# Patient Record
Sex: Male | Born: 1945 | Race: White | Hispanic: No | Marital: Single | Smoking: Current some day smoker
Health system: Southern US, Community
[De-identification: ages and names within clinical notes are randomized; demographics above are authoritative.]

## PROBLEM LIST (undated history)

## (undated) DIAGNOSIS — I4510 Unspecified right bundle-branch block: Secondary | ICD-10-CM

## (undated) DIAGNOSIS — Z87442 Personal history of urinary calculi: Secondary | ICD-10-CM

## (undated) DIAGNOSIS — R7303 Prediabetes: Secondary | ICD-10-CM

## (undated) DIAGNOSIS — R350 Frequency of micturition: Secondary | ICD-10-CM

## (undated) DIAGNOSIS — E1165 Type 2 diabetes mellitus with hyperglycemia: Secondary | ICD-10-CM

## (undated) DIAGNOSIS — N138 Other obstructive and reflux uropathy: Secondary | ICD-10-CM

## (undated) DIAGNOSIS — E559 Vitamin D deficiency, unspecified: Secondary | ICD-10-CM

## (undated) DIAGNOSIS — R3915 Urgency of urination: Secondary | ICD-10-CM

## (undated) DIAGNOSIS — K219 Gastro-esophageal reflux disease without esophagitis: Secondary | ICD-10-CM

## (undated) DIAGNOSIS — R12 Heartburn: Secondary | ICD-10-CM

## (undated) DIAGNOSIS — E785 Hyperlipidemia, unspecified: Secondary | ICD-10-CM

## (undated) DIAGNOSIS — N401 Enlarged prostate with lower urinary tract symptoms: Secondary | ICD-10-CM

## (undated) DIAGNOSIS — G4733 Obstructive sleep apnea (adult) (pediatric): Secondary | ICD-10-CM

## (undated) DIAGNOSIS — R1310 Dysphagia, unspecified: Secondary | ICD-10-CM

## (undated) DIAGNOSIS — M1990 Unspecified osteoarthritis, unspecified site: Secondary | ICD-10-CM

## (undated) DIAGNOSIS — N529 Male erectile dysfunction, unspecified: Secondary | ICD-10-CM

## (undated) DIAGNOSIS — I2510 Atherosclerotic heart disease of native coronary artery without angina pectoris: Secondary | ICD-10-CM

## (undated) DIAGNOSIS — M5135 Other intervertebral disc degeneration, thoracolumbar region: Secondary | ICD-10-CM

## (undated) DIAGNOSIS — Z8719 Personal history of other diseases of the digestive system: Secondary | ICD-10-CM

## (undated) DIAGNOSIS — I10 Essential (primary) hypertension: Secondary | ICD-10-CM

## (undated) DIAGNOSIS — K449 Diaphragmatic hernia without obstruction or gangrene: Secondary | ICD-10-CM

## (undated) DIAGNOSIS — I77810 Thoracic aortic ectasia: Secondary | ICD-10-CM

## (undated) DIAGNOSIS — R011 Cardiac murmur, unspecified: Secondary | ICD-10-CM

## (undated) DIAGNOSIS — E669 Obesity, unspecified: Secondary | ICD-10-CM

## (undated) DIAGNOSIS — Z8711 Personal history of peptic ulcer disease: Secondary | ICD-10-CM

## (undated) DIAGNOSIS — K760 Fatty (change of) liver, not elsewhere classified: Secondary | ICD-10-CM

## (undated) DIAGNOSIS — D126 Benign neoplasm of colon, unspecified: Secondary | ICD-10-CM

## (undated) DIAGNOSIS — E291 Testicular hypofunction: Secondary | ICD-10-CM

## (undated) DIAGNOSIS — E349 Endocrine disorder, unspecified: Secondary | ICD-10-CM

## (undated) DIAGNOSIS — M720 Palmar fascial fibromatosis [Dupuytren]: Secondary | ICD-10-CM

## (undated) HISTORY — DX: Benign prostatic hyperplasia with lower urinary tract symptoms: N40.1

## (undated) HISTORY — DX: Male erectile dysfunction, unspecified: N52.9

## (undated) HISTORY — DX: Personal history of peptic ulcer disease: Z87.11

## (undated) HISTORY — PX: CARDIAC CATHETERIZATION: SHX172

## (undated) HISTORY — PX: UPPER GASTROINTESTINAL ENDOSCOPY: SHX188

## (undated) HISTORY — DX: Obesity, unspecified: E66.9

## (undated) HISTORY — DX: Dysphagia, unspecified: R13.10

## (undated) HISTORY — DX: Personal history of other diseases of the digestive system: Z87.19

## (undated) HISTORY — PX: COLONOSCOPY: SHX174

## (undated) HISTORY — PX: OTHER SURGICAL HISTORY: SHX169

## (undated) HISTORY — PX: ACHILLES TENDON REPAIR: SUR1153

## (undated) HISTORY — DX: Vitamin D deficiency, unspecified: E55.9

## (undated) HISTORY — DX: Testicular hypofunction: E29.1

## (undated) HISTORY — DX: Urgency of urination: R39.15

## (undated) HISTORY — PX: APPENDECTOMY: SHX54

## (undated) HISTORY — DX: Heartburn: R12

## (undated) HISTORY — DX: Frequency of micturition: R35.0

## (undated) HISTORY — DX: Personal history of urinary calculi: Z87.442

## (undated) HISTORY — DX: Benign prostatic hyperplasia with lower urinary tract symptoms: N13.8

## (undated) HISTORY — DX: Benign neoplasm of colon, unspecified: D12.6

## (undated) HISTORY — DX: Hyperlipidemia, unspecified: E78.5

## (undated) HISTORY — DX: Atherosclerotic heart disease of native coronary artery without angina pectoris: I25.10

---

## 2005-05-14 ENCOUNTER — Emergency Department: Payer: Self-pay | Admitting: Emergency Medicine

## 2007-06-02 ENCOUNTER — Ambulatory Visit: Payer: Self-pay | Admitting: Internal Medicine

## 2009-11-17 ENCOUNTER — Ambulatory Visit: Payer: Self-pay | Admitting: Unknown Physician Specialty

## 2011-11-14 ENCOUNTER — Ambulatory Visit: Payer: Self-pay

## 2011-11-14 LAB — COMPREHENSIVE METABOLIC PANEL
Anion Gap: 8 (ref 7–16)
BUN: 18 mg/dL (ref 7–18)
Bilirubin,Total: 0.5 mg/dL (ref 0.2–1.0)
Chloride: 109 mmol/L — ABNORMAL HIGH (ref 98–107)
Co2: 25 mmol/L (ref 21–32)
EGFR (African American): >60
Osmolality: 284 (ref 275–301)
Potassium: 4.1 mmol/L (ref 3.5–5.1)
SGOT(AST): 31 U/L (ref 15–37)
Sodium: 142 mmol/L (ref 136–145)
Total Protein: 7.3 g/dL (ref 6.4–8.2)

## 2011-11-14 LAB — CBC WITH DIFFERENTIAL/PLATELET
Basophil #: 0.1 10*3/uL (ref 0.0–0.1)
Basophil %: 0.9 %
Eosinophil %: 2.1 %
Lymphocyte #: 1.6 10*3/uL (ref 1.0–3.6)
MCH: 29.9 pg (ref 26.0–34.0)
Monocyte %: 6.7 %
Neutrophil #: 4.2 10*3/uL (ref 1.4–6.5)
Neutrophil %: 64.8 %
Platelet: 210 10*3/uL (ref 150–440)
RBC: 4.91 10*6/uL (ref 4.40–5.90)
RDW: 13.3 % (ref 11.5–14.5)
WBC: 6.4 10*3/uL (ref 3.8–10.6)

## 2013-01-28 ENCOUNTER — Ambulatory Visit: Payer: Self-pay | Admitting: Unknown Physician Specialty

## 2013-07-12 DIAGNOSIS — N400 Enlarged prostate without lower urinary tract symptoms: Secondary | ICD-10-CM | POA: Insufficient documentation

## 2013-07-12 DIAGNOSIS — E349 Endocrine disorder, unspecified: Secondary | ICD-10-CM | POA: Insufficient documentation

## 2013-07-12 DIAGNOSIS — K30 Functional dyspepsia: Secondary | ICD-10-CM | POA: Insufficient documentation

## 2014-12-07 ENCOUNTER — Encounter: Payer: Self-pay | Admitting: *Deleted

## 2014-12-16 ENCOUNTER — Other Ambulatory Visit: Payer: Commercial Managed Care - HMO

## 2014-12-16 DIAGNOSIS — R972 Elevated prostate specific antigen [PSA]: Secondary | ICD-10-CM

## 2014-12-17 LAB — PSA: Prostate Specific Ag, Serum: 1 ng/mL (ref 0.0–4.0)

## 2014-12-19 ENCOUNTER — Encounter: Payer: Self-pay | Admitting: *Deleted

## 2014-12-19 ENCOUNTER — Encounter: Payer: Self-pay | Admitting: Urology

## 2014-12-19 ENCOUNTER — Ambulatory Visit (INDEPENDENT_AMBULATORY_CARE_PROVIDER_SITE_OTHER): Payer: Commercial Managed Care - HMO | Admitting: Urology

## 2014-12-19 VITALS — BP 165/80 | HR 76 | Resp 16 | Ht 77.0 in | Wt 283.4 lb

## 2014-12-19 DIAGNOSIS — N138 Other obstructive and reflux uropathy: Secondary | ICD-10-CM | POA: Insufficient documentation

## 2014-12-19 DIAGNOSIS — N401 Enlarged prostate with lower urinary tract symptoms: Secondary | ICD-10-CM | POA: Diagnosis not present

## 2014-12-19 DIAGNOSIS — E785 Hyperlipidemia, unspecified: Secondary | ICD-10-CM | POA: Insufficient documentation

## 2014-12-19 MED ORDER — FINASTERIDE 5 MG PO TABS: 5.0000 mg | ORAL_TABLET | Freq: Every day | ORAL | Status: DC

## 2014-12-19 NOTE — Progress Notes (Signed)
12/19/2014 9:56 AM   Brian Banks June 08, 1945 [id number]  Referring provider: No referring provider defined for this encounter.  Chief Complaint  Patient presents with  . Benign Prostatic Hypertrophy    HPI: Patient is a 69 year old white male with a history of BPH with LUTS who manages it with finasteride 5 mg daily who presents today for a 6 month follow-up.  BPH WITH LUTS His IPSS score today is 11, which is moderate lower urinary tract symptomatology. He is mixed with his quality life due to his urinary symptoms. His major complaint today is personal.  He has had this symptoms for 2 weeks.   He denies any dysuria, hematuria or suprapubic pain.   He currently taking finasteride 5 mg daily.  He also denies any recent fevers, chills, nausea or vomiting.  He has a family history of PCa, with his brother being diagnosed with prostate cancer.         IPSS      12/19/14 0900       International Prostate Symptom Score   How often have you had the sensation of not emptying your bladder? Less than 1 in 5     How often have you had to urinate less than every two hours? More than half the time     How often have you found you stopped and started again several times when you urinated? Not at All     How often have you found it difficult to postpone urination? About half the time     How often have you had a weak urinary stream? Less than half the time     How often have you had to strain to start urination? Not at All     How many times did you typically get up at night to urinate? 1 Time     Total IPSS Score 11     Quality of Life due to urinary symptoms   If you were to spend the rest of your life with your urinary condition just the way it is now how would you feel about that? Mixed        Score:  1-7 Mild 8-19 Moderate 20-35 Severe     PMH: Past Medical History  Diagnosis Date  . History of kidney stones   . HLD (hyperlipidemia)   . Dysphagia   . Vitamin D deficiency     . Benign neoplasm of large bowel   . Heartburn   . History of stomach ulcers   . Obesity   . BPH with obstruction/lower urinary tract symptoms   . Erectile dysfunction   . Urinary frequency   . Urinary urgency   . Hypogonadism in male     Surgical History: Past Surgical History  Procedure Laterality Date  . Appendectomy      Home Medications:    Medication List       This list is accurate as of: 12/19/14  9:56 AM.  Always use your most recent med list.               finasteride 5 MG tablet  Commonly known as:  PROSCAR  Take 1 tablet (5 mg total) by mouth daily.     Glucosamine-Chondroitin 750-600 MG Tabs  Take by mouth.     omeprazole 40 MG capsule  Commonly known as:  PRILOSEC  Take by mouth.        Allergies:  Allergies  Allergen Reactions  . Androgel [Testosterone]   .  Lovastatin     Family History: Family History  Problem Relation Age of Onset  . Alzheimer's disease Father   . Prostate cancer Brother   . Diabetes Mellitus II Mother   . Stroke Mother     Social History:  reports that he has been smoking Pipe.  He does not have any smokeless tobacco history on file. He reports that he drinks alcohol. His drug history is not on file.  ROS: UROLOGY Frequent Urination?: No Hard to postpone urination?: No Burning/pain with urination?: No Get up at night to urinate?: No Leakage of urine?: No Urine stream starts and stops?: No Trouble starting stream?: No Do you have to strain to urinate?: No Blood in urine?: No Urinary tract infection?: No Sexually transmitted disease?: No Injury to kidneys or bladder?: No Painful intercourse?: No Weak stream?: No Erection problems?: Yes Penile pain?: No  Gastrointestinal Nausea?: No Vomiting?: No Indigestion/heartburn?: No Diarrhea?: No Constipation?: No  Constitutional Fever: No Night sweats?: No Weight loss?: No Fatigue?: No  Skin Skin rash/lesions?: No Itching?: No  Eyes Blurred  vision?: No Double vision?: No  Ears/Nose/Throat Sore throat?: No Sinus problems?: No  Hematologic/Lymphatic Swollen glands?: No Easy bruising?: No  Cardiovascular Leg swelling?: No Chest pain?: No  Respiratory Cough?: No Shortness of breath?: No  Endocrine Excessive thirst?: No  Musculoskeletal Back pain?: No Joint pain?: Yes  Neurological Headaches?: No Dizziness?: No  Psychologic Depression?: No Anxiety?: No  Physical Exam: BP 165/80 mmHg  Pulse 76  Resp 16  Ht 6\' 5"  (1.956 m)  Wt 283 lb 6.4 oz (128.549 kg)  BMI 33.60 kg/m2  GU: No CVA tenderness.  No bladder fullness or masses.  Patient with uncircumcised phallus. Foreskin easily retracted  Urethral meatus is patent.  No penile discharge. No penile lesions or rashes. Scrotum without lesions, cysts, rashes and/or edema.  Testicles are located scrotally bilaterally. No masses are appreciated in the testicles. Left and right epididymis are normal. Rectal: Patient with  normal sphincter tone. Anus and perineum without scarring or rashes. No rectal masses are appreciated. Prostate is approximately 50 grams, no nodules are appreciated. Seminal vesicles are normal.   Laboratory Data: Lab Results  Component Value Date   WBC 6.4 11/14/2011   HGB 14.7 11/14/2011   HCT 43.3 11/14/2011   MCV 88 11/14/2011   PLT 210 11/14/2011    Lab Results  Component Value Date   CREATININE 0.99 11/14/2011   PSA history  1.2 ng/mL on 05/26/2013  1.0 ng/mL on 12/10/2013  1.0 ng/mL on 06/17/2014  Lab Results  Component Value Date   PSA 1.0 12/16/2014     Assessment & Plan:    1. BPH (benign prostatic hyperplasia) with LUTS:   I PSS score is 11/3. He will continue the finasteride 5 mg daily. He will return in 1 year for I PSS score, exam and PSA. A refill for the finasteride is sent to his pharmacy.  Return in about 1 year (around 12/19/2015) for IPSS and exam.  Zara Council, [facility] [address] [place], [place] [address] [phone]

## 2015-12-18 ENCOUNTER — Other Ambulatory Visit: Payer: Self-pay

## 2015-12-18 DIAGNOSIS — N401 Enlarged prostate with lower urinary tract symptoms: Secondary | ICD-10-CM

## 2015-12-19 ENCOUNTER — Other Ambulatory Visit: Payer: Commercial Managed Care - HMO

## 2015-12-19 ENCOUNTER — Encounter: Payer: Self-pay | Admitting: Urology

## 2015-12-26 ENCOUNTER — Other Ambulatory Visit: Payer: Commercial Managed Care - HMO

## 2015-12-26 ENCOUNTER — Ambulatory Visit: Payer: Commercial Managed Care - HMO | Admitting: Urology

## 2015-12-26 DIAGNOSIS — N401 Enlarged prostate with lower urinary tract symptoms: Secondary | ICD-10-CM

## 2015-12-27 ENCOUNTER — Encounter: Payer: Self-pay | Admitting: Urology

## 2015-12-27 ENCOUNTER — Ambulatory Visit (INDEPENDENT_AMBULATORY_CARE_PROVIDER_SITE_OTHER): Payer: Commercial Managed Care - HMO | Admitting: Urology

## 2015-12-27 VITALS — BP 135/73 | HR 80 | Ht 77.0 in | Wt 294.5 lb

## 2015-12-27 DIAGNOSIS — N401 Enlarged prostate with lower urinary tract symptoms: Secondary | ICD-10-CM

## 2015-12-27 DIAGNOSIS — N138 Other obstructive and reflux uropathy: Secondary | ICD-10-CM | POA: Diagnosis not present

## 2015-12-27 LAB — PSA: Prostate Specific Ag, Serum: 1 ng/mL (ref 0.0–4.0)

## 2015-12-27 NOTE — Progress Notes (Signed)
12/27/2015 3:46 PM   Brian Banks 09-Sep-1945 [id number]  Referring provider: Adin Hector, MD [facility] [place], [place] [address]  No chief complaint on file.   HPI: Patient is a 70 year old Caucasian male with a history of BPH with LUTS who manages it with finasteride 5 mg daily who presents today for a one year follow-up.  BPH WITH LUTS His IPSS score today is 11, which is moderate lower urinary tract symptomatology. He is mixed with his quality life due to his urinary symptoms. His previous IPSS score 11/3.  His major complaints today are urgency and urge incontinence.  He has had this symptoms for several months.   He denies any dysuria, hematuria or suprapubic pain.   He currently taking finasteride 5 mg daily.  He also denies any recent fevers, chills, nausea or vomiting.  He has a family history of PCa, with his brother being diagnosed with prostate cancer.         IPSS    Row Name 12/27/15 1500         International Prostate Symptom Score   How often have you had the sensation of not emptying your bladder? Not at All     How often have you had to urinate less than every two hours? Almost always     How often have you found you stopped and started again several times when you urinated? Not at All     How often have you found it difficult to postpone urination? More than half the time     How often have you had a weak urinary stream? Less than 1 in 5 times     How often have you had to strain to start urination? Not at All     How many times did you typically get up at night to urinate? 1 Time     Total IPSS Score 11       Quality of Life due to urinary symptoms   If you were to spend the rest of your life with your urinary condition just the way it is now how would you feel about that? Mixed        Score:  1-7 Mild 8-19 Moderate 20-35 Severe     PMH: Past Medical History:  Diagnosis Date  . Benign neoplasm of large bowel     . BPH with obstruction/lower urinary tract symptoms   . Dysphagia   . Erectile dysfunction   . Heartburn   . History of kidney stones   . History of stomach ulcers   . HLD (hyperlipidemia)   . Hypogonadism in male   . Obesity   . Urinary frequency   . Urinary urgency   . Vitamin D deficiency     Surgical History: Past Surgical History:  Procedure Laterality Date  . APPENDECTOMY      Home Medications:    Medication List       Accurate as of 12/27/15  3:46 PM. Always use your most recent med list.          finasteride 5 MG tablet Commonly known as:  PROSCAR Take 1 tablet (5 mg total) by mouth daily.   Glucosamine-Chondroitin 750-600 MG Tabs Take by mouth.   omeprazole 40 MG capsule Commonly known as:  PRILOSEC Take by mouth.       Allergies:  Allergies  Allergen Reactions  . Androgel [Testosterone]   . Lovastatin     Family History: Family  History  Problem Relation Age of Onset  . Alzheimer's disease Father   . Prostate cancer Brother   . Diabetes Mellitus II Mother   . Stroke Mother     Social History:  reports that he has been smoking Pipe.  He does not have any smokeless tobacco history on file. He reports that he drinks alcohol. His drug history is not on file.  ROS: UROLOGY Frequent Urination?: No Hard to postpone urination?: Yes Burning/pain with urination?: No Get up at night to urinate?: No Leakage of urine?: Yes Urine stream starts and stops?: No Trouble starting stream?: No Do you have to strain to urinate?: No Blood in urine?: No Urinary tract infection?: No Sexually transmitted disease?: No Injury to kidneys or bladder?: No Painful intercourse?: No Weak stream?: No Erection problems?: Yes Penile pain?: No  Gastrointestinal Nausea?: No Vomiting?: No Indigestion/heartburn?: No Diarrhea?: No Constipation?: No  Constitutional Fever: No Night sweats?: No Weight loss?: No Fatigue?: No  Skin Skin rash/lesions?:  No Itching?: No  Eyes Blurred vision?: No Double vision?: No  Ears/Nose/Throat Sore throat?: No Sinus problems?: No  Hematologic/Lymphatic Swollen glands?: No Easy bruising?: No  Cardiovascular Leg swelling?: No Chest pain?: No  Respiratory Cough?: No Shortness of breath?: No  Endocrine Excessive thirst?: No  Musculoskeletal Back pain?: No Joint pain?: No  Neurological Headaches?: No Dizziness?: No  Psychologic Depression?: No Anxiety?: No  Physical Exam: BP 135/73 (BP Location: Left Arm, Patient Position: Sitting, Cuff Size: Large)   Pulse 80   Ht 6\' 5"  (1.956 m)   Wt 294 lb 8 oz (133.6 kg)   BMI 34.92 kg/m   GU: No CVA tenderness.  No bladder fullness or masses.  Patient with uncircumcised phallus. Foreskin easily retracted  Urethral meatus is patent.  No penile discharge. No penile lesions or rashes. Scrotum without lesions, cysts, rashes and/or edema.  Testicles are located scrotally bilaterally. No masses are appreciated in the testicles. Left and right epididymis are normal. Rectal: Patient with  normal sphincter tone. Anus and perineum without scarring or rashes. No rectal masses are appreciated. Prostate is approximately 50 grams, no nodules are appreciated. Seminal vesicles are normal.   Laboratory Data: Lab Results  Component Value Date   WBC 6.4 11/14/2011   HGB 14.7 11/14/2011   HCT 43.3 11/14/2011   MCV 88 11/14/2011   PLT 210 11/14/2011    Lab Results  Component Value Date   CREATININE 0.99 11/14/2011   PSA history  1.2 ng/mL on 05/26/2013  1.0 ng/mL on 12/10/2013  1.0 ng/mL on 06/17/2014  1.0 ng/mL on 12/26/2015  Assessment & Plan:    1. BPH with LUTS  - IPSS score is 11/3, it is stable  - Continue conservative management, avoiding bladder irritants and timed voiding's  - Continue finasteride 5 mg daily; refills are given  - RTC in 12 months for IPSS, PSA and exam   Return in about 1 year (around 12/26/2016) for IPSS, PSA and  exam.  Zara Council, [facility]  [place] [address] [place], [place] [address] [phone]

## 2016-01-04 MED ORDER — FINASTERIDE 5 MG PO TABS: 5.0000 mg | ORAL_TABLET | Freq: Every day | ORAL | 4 refills | Status: DC

## 2016-01-30 DIAGNOSIS — R739 Hyperglycemia, unspecified: Secondary | ICD-10-CM | POA: Insufficient documentation

## 2016-01-30 DIAGNOSIS — R7989 Other specified abnormal findings of blood chemistry: Secondary | ICD-10-CM | POA: Insufficient documentation

## 2016-02-19 DIAGNOSIS — R946 Abnormal results of thyroid function studies: Secondary | ICD-10-CM | POA: Diagnosis not present

## 2016-02-19 DIAGNOSIS — R739 Hyperglycemia, unspecified: Secondary | ICD-10-CM | POA: Diagnosis not present

## 2016-09-20 DIAGNOSIS — H2513 Age-related nuclear cataract, bilateral: Secondary | ICD-10-CM | POA: Diagnosis not present

## 2016-12-02 ENCOUNTER — Other Ambulatory Visit: Payer: Self-pay

## 2016-12-02 DIAGNOSIS — N401 Enlarged prostate with lower urinary tract symptoms: Secondary | ICD-10-CM

## 2016-12-02 MED ORDER — FINASTERIDE 5 MG PO TABS: 5.0000 mg | ORAL_TABLET | Freq: Every day | ORAL | 0 refills | Status: DC

## 2016-12-19 ENCOUNTER — Encounter: Payer: Self-pay | Admitting: Urology

## 2016-12-19 ENCOUNTER — Other Ambulatory Visit: Payer: Self-pay

## 2016-12-19 ENCOUNTER — Other Ambulatory Visit: Payer: Commercial Managed Care - HMO

## 2016-12-19 DIAGNOSIS — N401 Enlarged prostate with lower urinary tract symptoms: Secondary | ICD-10-CM

## 2016-12-20 ENCOUNTER — Other Ambulatory Visit: Payer: Self-pay

## 2016-12-20 DIAGNOSIS — N401 Enlarged prostate with lower urinary tract symptoms: Secondary | ICD-10-CM | POA: Diagnosis not present

## 2016-12-21 LAB — PSA: Prostate Specific Ag, Serum: 0.8 ng/mL (ref 0.0–4.0)

## 2016-12-25 NOTE — Progress Notes (Signed)
12/26/2016 9:15 AM   Brian Banks October 12, 1945 [id number]  Referring provider: Adin Hector, MD [place] [facility] [place], [place] [address]  No chief complaint on file.   HPI: Patient is a 71 year old Caucasian male with a history of BPH with LUTS who manages it with finasteride 5 mg daily who presents today for a one year follow-up.  BPH WITH LUTS His IPSS score today is 9, which is moderate lower urinary tract symptomatology. He is mixed with his quality life due to his urinary symptoms.  His previous IPSS score 11/3.  His major complaints today are frequency, urgency, nocturia and incontinence..  He has had this symptoms for several months.   He denies any dysuria, hematuria or suprapubic pain.   He currently taking finasteride 5 mg daily.  He also denies any recent fevers, chills, nausea or vomiting.  He has a family history of PCa, with his brother being diagnosed with prostate cancer.     IPSS    Row Name 12/26/16 0800         International Prostate Symptom Score   How often have you had the sensation of not emptying your bladder?  Not at All     How often have you had to urinate less than every two hours?  Less than half the time     How often have you found you stopped and started again several times when you urinated?  Not at All     How often have you found it difficult to postpone urination?  More than half the time     How often have you had a weak urinary stream?  Less than 1 in 5 times     How often have you had to strain to start urination?  Not at All     How many times did you typically get up at night to urinate?  2 Times     Total IPSS Score  9       Quality of Life due to urinary symptoms   If you were to spend the rest of your life with your urinary condition just the way it is now how would you feel about that?  Mixed        Score:  1-7 Mild 8-19 Moderate 20-35 Severe     PMH: Past Medical History:  Diagnosis Date  .  Benign neoplasm of large bowel   . BPH with obstruction/lower urinary tract symptoms   . Dysphagia   . Erectile dysfunction   . Heartburn   . History of kidney stones   . History of stomach ulcers   . HLD (hyperlipidemia)   . Hypogonadism in male   . Obesity   . Urinary frequency   . Urinary urgency   . Vitamin D deficiency     Surgical History: Past Surgical History:  Procedure Laterality Date  . APPENDECTOMY      Home Medications:  Allergies as of 12/26/2016      Reactions   Androgel [testosterone]    Lovastatin       Medication List        Accurate as of 12/26/16  9:15 AM. Always use your most recent med list.          finasteride 5 MG tablet Commonly known as:  PROSCAR Take 1 tablet (5 mg total) daily by mouth.   Glucosamine-Chondroitin 750-600 MG Tabs Take by mouth.   omeprazole 40 MG capsule Commonly known as:  PRILOSEC Take by mouth.       Allergies:  Allergies  Allergen Reactions  . Androgel [Testosterone]   . Lovastatin     Family History: Family History  Problem Relation Age of Onset  . Alzheimer's disease Father   . Prostate cancer Brother   . Diabetes Mellitus II Mother   . Stroke Mother     Social History:  reports that he has been smoking cigars.  he has never used smokeless tobacco. He reports that he drinks alcohol. He reports that he does not use drugs.  ROS: UROLOGY Frequent Urination?: Yes Hard to postpone urination?: Yes Burning/pain with urination?: No Get up at night to urinate?: Yes Leakage of urine?: Yes Urine stream starts and stops?: No Trouble starting stream?: No Do you have to strain to urinate?: No Blood in urine?: No Urinary tract infection?: No Sexually transmitted disease?: No Injury to kidneys or bladder?: No Painful intercourse?: No Weak stream?: No Erection problems?: Yes Penile pain?: No  Gastrointestinal Nausea?: No Vomiting?: No Indigestion/heartburn?: No Diarrhea?: No Constipation?:  No  Constitutional Fever: No Night sweats?: No Weight loss?: No Fatigue?: No  Skin Skin rash/lesions?: No Itching?: No  Eyes Blurred vision?: No Double vision?: No  Ears/Nose/Throat Sore throat?: No Sinus problems?: No  Hematologic/Lymphatic Swollen glands?: No Easy bruising?: No  Cardiovascular Leg swelling?: No Chest pain?: No  Respiratory Cough?: No Shortness of breath?: No  Endocrine Excessive thirst?: No  Musculoskeletal Back pain?: No Joint pain?: No  Neurological Headaches?: No Dizziness?: No  Psychologic Depression?: No Anxiety?: No  Physical Exam: BP 135/69   Pulse 88   Ht 6\' 5"  (1.956 m)   Wt (!) 302 lb (137 kg)   BMI 35.81 kg/m   GU: No CVA tenderness.  No bladder fullness or masses.  Patient with uncircumcised phallus. Foreskin easily retracted  Urethral meatus is patent.  No penile discharge. No penile lesions or rashes. Scrotum without lesions, cysts, rashes and/or edema.  Testicles are located scrotally bilaterally. No masses are appreciated in the testicles. Left and right epididymis are normal. Rectal: Patient with  normal sphincter tone. Anus and perineum without scarring or rashes. No rectal masses are appreciated. Prostate is approximately 50 grams, no nodules are appreciated. Seminal vesicles are normal.   Laboratory Data:  PSA history  1.2 ng/mL on 05/26/2013  1.0 ng/mL on 12/10/2013  1.0 ng/mL on 06/17/2014  1.0 ng/mL on 12/26/2015  0.8 ng/mL in 12/2016  I have reviewed the labs.  Assessment & Plan:    1. BPH with LUTS  - IPSS score is 9/3, it is improved  - Continue conservative management, avoiding bladder irritants and timed voiding's  - Continue finasteride 5 mg daily; refills are given  - RTC in 12 months for IPSS, PSA and exam   Return in about 1 year (around 12/26/2017) for IPSS, PSA and exam.  Zara Council, [facility]  [place] [address], [place], [place]  [address] [phone]

## 2016-12-26 ENCOUNTER — Ambulatory Visit: Payer: PPO | Admitting: Urology

## 2016-12-26 ENCOUNTER — Encounter: Payer: Self-pay | Admitting: Urology

## 2016-12-26 VITALS — BP 135/69 | HR 88 | Ht 77.0 in | Wt 302.0 lb

## 2016-12-26 DIAGNOSIS — N401 Enlarged prostate with lower urinary tract symptoms: Secondary | ICD-10-CM

## 2016-12-26 DIAGNOSIS — N138 Other obstructive and reflux uropathy: Secondary | ICD-10-CM | POA: Diagnosis not present

## 2016-12-26 MED ORDER — FINASTERIDE 5 MG PO TABS: 5.0000 mg | ORAL_TABLET | Freq: Every day | ORAL | 3 refills | Status: DC

## 2017-01-23 DIAGNOSIS — N400 Enlarged prostate without lower urinary tract symptoms: Secondary | ICD-10-CM | POA: Diagnosis not present

## 2017-01-23 DIAGNOSIS — E7849 Other hyperlipidemia: Secondary | ICD-10-CM | POA: Diagnosis not present

## 2017-01-23 DIAGNOSIS — E349 Endocrine disorder, unspecified: Secondary | ICD-10-CM | POA: Diagnosis not present

## 2017-01-29 DIAGNOSIS — Z0000 Encounter for general adult medical examination without abnormal findings: Secondary | ICD-10-CM | POA: Diagnosis not present

## 2017-01-29 DIAGNOSIS — R1013 Epigastric pain: Secondary | ICD-10-CM | POA: Diagnosis not present

## 2017-01-29 DIAGNOSIS — R7989 Other specified abnormal findings of blood chemistry: Secondary | ICD-10-CM | POA: Diagnosis not present

## 2017-01-29 DIAGNOSIS — R739 Hyperglycemia, unspecified: Secondary | ICD-10-CM | POA: Diagnosis not present

## 2017-01-29 DIAGNOSIS — R748 Abnormal levels of other serum enzymes: Secondary | ICD-10-CM | POA: Diagnosis not present

## 2017-01-29 DIAGNOSIS — E7849 Other hyperlipidemia: Secondary | ICD-10-CM | POA: Diagnosis not present

## 2017-01-29 DIAGNOSIS — E349 Endocrine disorder, unspecified: Secondary | ICD-10-CM | POA: Diagnosis not present

## 2017-01-29 DIAGNOSIS — N400 Enlarged prostate without lower urinary tract symptoms: Secondary | ICD-10-CM | POA: Diagnosis not present

## 2017-02-19 DIAGNOSIS — R748 Abnormal levels of other serum enzymes: Secondary | ICD-10-CM | POA: Diagnosis not present

## 2017-02-19 DIAGNOSIS — R7989 Other specified abnormal findings of blood chemistry: Secondary | ICD-10-CM | POA: Diagnosis not present

## 2017-02-20 ENCOUNTER — Other Ambulatory Visit: Payer: Self-pay | Admitting: Internal Medicine

## 2017-02-20 DIAGNOSIS — R748 Abnormal levels of other serum enzymes: Secondary | ICD-10-CM

## 2017-03-06 DIAGNOSIS — R748 Abnormal levels of other serum enzymes: Secondary | ICD-10-CM | POA: Diagnosis not present

## 2017-03-10 ENCOUNTER — Ambulatory Visit: Payer: PPO

## 2017-03-11 ENCOUNTER — Ambulatory Visit
Admission: RE | Admit: 2017-03-11 | Discharge: 2017-03-11 | Disposition: A | Discharge status: Home / Self Care | Payer: PPO | Source: Ambulatory Visit | Attending: Internal Medicine | Admitting: Internal Medicine

## 2017-03-11 DIAGNOSIS — R932 Abnormal findings on diagnostic imaging of liver and biliary tract: Secondary | ICD-10-CM | POA: Diagnosis not present

## 2017-03-11 DIAGNOSIS — R748 Abnormal levels of other serum enzymes: Secondary | ICD-10-CM | POA: Insufficient documentation

## 2017-03-11 DIAGNOSIS — R945 Abnormal results of liver function studies: Secondary | ICD-10-CM | POA: Diagnosis not present

## 2017-04-15 DIAGNOSIS — K219 Gastro-esophageal reflux disease without esophagitis: Secondary | ICD-10-CM | POA: Insufficient documentation

## 2017-07-29 DIAGNOSIS — E349 Endocrine disorder, unspecified: Secondary | ICD-10-CM | POA: Diagnosis not present

## 2017-07-29 DIAGNOSIS — R7989 Other specified abnormal findings of blood chemistry: Secondary | ICD-10-CM | POA: Diagnosis not present

## 2017-07-29 DIAGNOSIS — E7849 Other hyperlipidemia: Secondary | ICD-10-CM | POA: Diagnosis not present

## 2017-07-29 DIAGNOSIS — R1013 Epigastric pain: Secondary | ICD-10-CM | POA: Diagnosis not present

## 2017-07-29 DIAGNOSIS — R739 Hyperglycemia, unspecified: Secondary | ICD-10-CM | POA: Diagnosis not present

## 2017-08-07 DIAGNOSIS — E7849 Other hyperlipidemia: Secondary | ICD-10-CM | POA: Diagnosis not present

## 2017-08-07 DIAGNOSIS — R1013 Epigastric pain: Secondary | ICD-10-CM | POA: Diagnosis not present

## 2017-08-07 DIAGNOSIS — R7989 Other specified abnormal findings of blood chemistry: Secondary | ICD-10-CM | POA: Diagnosis not present

## 2017-08-07 DIAGNOSIS — K219 Gastro-esophageal reflux disease without esophagitis: Secondary | ICD-10-CM | POA: Diagnosis not present

## 2017-08-07 DIAGNOSIS — E039 Hypothyroidism, unspecified: Secondary | ICD-10-CM | POA: Diagnosis not present

## 2017-08-07 DIAGNOSIS — E349 Endocrine disorder, unspecified: Secondary | ICD-10-CM | POA: Diagnosis not present

## 2017-08-07 DIAGNOSIS — R739 Hyperglycemia, unspecified: Secondary | ICD-10-CM | POA: Diagnosis not present

## 2017-08-29 DIAGNOSIS — E039 Hypothyroidism, unspecified: Secondary | ICD-10-CM | POA: Diagnosis not present

## 2017-12-23 ENCOUNTER — Encounter: Payer: Self-pay | Admitting: Urology

## 2017-12-23 ENCOUNTER — Other Ambulatory Visit: Payer: PPO

## 2017-12-23 ENCOUNTER — Other Ambulatory Visit: Payer: Self-pay | Admitting: Family Medicine

## 2017-12-23 DIAGNOSIS — N138 Other obstructive and reflux uropathy: Secondary | ICD-10-CM

## 2017-12-23 DIAGNOSIS — N401 Enlarged prostate with lower urinary tract symptoms: Principal | ICD-10-CM

## 2017-12-25 ENCOUNTER — Ambulatory Visit: Payer: PPO | Admitting: Urology

## 2017-12-31 ENCOUNTER — Encounter: Payer: Self-pay | Admitting: Urology

## 2017-12-31 ENCOUNTER — Other Ambulatory Visit: Payer: PPO

## 2017-12-31 DIAGNOSIS — N401 Enlarged prostate with lower urinary tract symptoms: Principal | ICD-10-CM

## 2017-12-31 DIAGNOSIS — N138 Other obstructive and reflux uropathy: Secondary | ICD-10-CM

## 2018-01-01 LAB — PSA: Prostate Specific Ag, Serum: 0.7 ng/mL (ref 0.0–4.0)

## 2018-01-14 NOTE — Progress Notes (Signed)
01/15/2018 10:04 AM   Brian Banks [birth year] [id number]  Referring provider: Adin Hector, MD [facility] [place], [place] [address]  Chief Complaint  Patient presents with  . Follow-up  . Benign Prostatic Hypertrophy    HPI: Patient is a 72 year old Caucasian male with a history of BPH with LUTS who manages it with finasteride 5 mg daily who presents today for a one year follow-up.  BPH WITH LUTS His IPSS score today is 10, which is moderate lower urinary tract symptomatology. He is unhappy with his quality life due to his urinary symptoms.  His PVR is 0 mL.  His previous IPSS score 9/3.  His major complaints today are urgency and urge incontinence for 6 months.  He denies any dysuria, hematuria or suprapubic pain.   He currently taking finasteride 5 mg daily.  He also denies any recent fevers, chills, nausea or vomiting.  He has a family history of PCa, with his brother being diagnosed with prostate cancer.    IPSS    Row Name 01/15/18 0900         International Prostate Symptom Score   How often have you had the sensation of not emptying your bladder?  Not at All     How often have you had to urinate less than every two hours?  Less than half the time     How often have you found you stopped and started again several times when you urinated?  Not at All     How often have you found it difficult to postpone urination?  Almost always     How often have you had a weak urinary stream?  Less than 1 in 5 times     How often have you had to strain to start urination?  Not at All     How many times did you typically get up at night to urinate?  2 Times     Total IPSS Score  10       Quality of Life due to urinary symptoms   If you were to spend the rest of your life with your urinary condition just the way it is now how would you feel about that?  Unhappy        Score:  1-7 Mild 8-19 Moderate 20-35 Severe  History of testosterone  deficiency He complains of no libido, weight gain and fatigue.  He states he is also dating again and is concerned that he does not have the sex drive.    PMH: Past Medical History:  Diagnosis Date  . Benign neoplasm of large bowel   . BPH with obstruction/lower urinary tract symptoms   . Dysphagia   . Erectile dysfunction   . Heartburn   . History of kidney stones   . History of stomach ulcers   . HLD (hyperlipidemia)   . Hypogonadism in male   . Obesity   . Urinary frequency   . Urinary urgency   . Vitamin D deficiency     Surgical History: Past Surgical History:  Procedure Laterality Date  . APPENDECTOMY      Home Medications:  Allergies as of 01/15/2018      Reactions   Androgel [testosterone]    Lovastatin       Medication List        Accurate as of 01/15/18 10:04 AM. Always use your most recent med list.          finasteride  5 MG tablet Commonly known as:  PROSCAR Take 1 tablet (5 mg total) by mouth daily.   Glucosamine-Chondroitin 750-600 MG Tabs Take by mouth.   omeprazole 40 MG capsule Commonly known as:  PRILOSEC Take by mouth.       Allergies:  Allergies  Allergen Reactions  . Androgel [Testosterone]   . Lovastatin     Family History: Family History  Problem Relation Age of Onset  . Alzheimer's disease Father   . Prostate cancer Brother   . Diabetes Mellitus II Mother   . Stroke Mother     Social History:  reports that he has been smoking cigars. He has never used smokeless tobacco. He reports that he drinks alcohol. He reports that he does not use drugs.  ROS: UROLOGY Frequent Urination?: No Hard to postpone urination?: Yes Burning/pain with urination?: No Get up at night to urinate?: No Leakage of urine?: Yes Urine stream starts and stops?: No Trouble starting stream?: No Do you have to strain to urinate?: No Blood in urine?: No Urinary tract infection?: No Sexually transmitted disease?: No Injury to kidneys or  bladder?: No Painful intercourse?: No Weak stream?: No Erection problems?: Yes Penile pain?: No  Gastrointestinal Nausea?: No Vomiting?: No Indigestion/heartburn?: No Diarrhea?: No Constipation?: No  Constitutional Fever: No Night sweats?: No Weight loss?: No Fatigue?: No  Skin Skin rash/lesions?: No Itching?: No  Eyes Blurred vision?: No Double vision?: No  Ears/Nose/Throat Sore throat?: No  Hematologic/Lymphatic Swollen glands?: No Easy bruising?: No  Cardiovascular Leg swelling?: No Chest pain?: No  Respiratory Cough?: No  Endocrine Excessive thirst?: No  Musculoskeletal Back pain?: No Joint pain?: No  Neurological Headaches?: No Dizziness?: No  Psychologic Depression?: No Anxiety?: No  Physical Exam: BP (!) 142/82 (BP Location: Left Arm, Patient Position: Sitting, Cuff Size: Large)   Pulse 73   Ht 6\' 6"  (1.981 m)   Wt (!) 300 lb 4.8 oz (136.2 kg)   BMI 34.70 kg/m   Constitutional: Well nourished. Alert and oriented, No acute distress. HEENT: [place] AT, moist mucus membranes. Trachea midline, no masses. Cardiovascular: No clubbing, cyanosis, or edema. Respiratory: Normal respiratory effort, no increased work of breathing. GI: Abdomen is soft, non tender, non distended, no abdominal masses. Liver and spleen not palpable.  No hernias appreciated.  Stool sample for occult testing is not indicated.   GU: No CVA tenderness.  No bladder fullness or masses.  Patient with uncircumcised phallus.  Foreskin easily retracted  Urethral meatus is patent.  No penile discharge. No penile lesions or rashes. Scrotum without lesions, cysts, rashes and/or edema.  Testicles are located scrotally bilaterally. No masses are appreciated in the testicles. Left and right epididymis are normal. Rectal: Patient with  normal sphincter tone. Anus and perineum without scarring or rashes. No rectal masses are appreciated. Prostate is approximately 60 grams, could only palpate  the apex and midportion of the glands, no nodules are appreciated.  Skin: No rashes, bruises or suspicious lesions. Lymph: No cervical or inguinal adenopathy. Neurologic: Grossly intact, no focal deficits, moving all 4 extremities. Psychiatric: Normal mood and affect.   Laboratory Data:  PSA history  1.2 ng/mL on 05/26/2013  1.0 ng/mL on 12/10/2013  1.0 ng/mL on 06/17/2014  1.0 ng/mL on 12/26/2015  0.8 ng/mL in 12/2016  0.7 ng/mL on 12/31/2017 I have reviewed the labs.  Assessment & Plan:    1. BPH with LUTS  - IPSS score is 9/3, it is improved  - Continue conservative management, avoiding bladder  irritants and timed voiding's  - Continue finasteride 5 mg daily; refills are given  - RTC in 12 months for IPSS, PSA and exam   2. Low libido/history of testosterone deficiency Had been on testosterone therapy in the past, will check a testosterone level at this time Advised the patient that if it does return low, he will need to return in the morning before 9 AM for repeat testosterone level for accuracy and he is agreeable  3. Urge incontinence Offered medical therapy with beta-3 adrenergic receptor agonist - given Myrbetriq 25 mg samples, #28.  I have reviewed with the patient of the side effects of Myrbetriq, such as: elevation in BP, urinary retention and/or HA.   RTC in 3 weeks for PVR and symptom recheck    Return in about 3 weeks (around 02/05/2018) for IPSS and PVR.  Zara Council, PA-C  [facility] [address] [place], [place] [address] [phone]

## 2018-01-15 ENCOUNTER — Encounter: Payer: Self-pay | Admitting: Urology

## 2018-01-15 ENCOUNTER — Ambulatory Visit: Payer: PPO | Admitting: Urology

## 2018-01-15 VITALS — BP 142/82 | HR 73 | Ht 78.0 in | Wt 300.3 lb

## 2018-01-15 DIAGNOSIS — N3941 Urge incontinence: Secondary | ICD-10-CM | POA: Diagnosis not present

## 2018-01-15 DIAGNOSIS — E349 Endocrine disorder, unspecified: Secondary | ICD-10-CM

## 2018-01-15 DIAGNOSIS — N138 Other obstructive and reflux uropathy: Secondary | ICD-10-CM

## 2018-01-15 DIAGNOSIS — N401 Enlarged prostate with lower urinary tract symptoms: Secondary | ICD-10-CM | POA: Diagnosis not present

## 2018-01-15 MED ORDER — FINASTERIDE 5 MG PO TABS: 5.0000 mg | ORAL_TABLET | Freq: Every day | ORAL | 3 refills | Status: DC

## 2018-01-15 MED ORDER — MIRABEGRON ER 25 MG PO TB24: 25.0000 mg | ORAL_TABLET | Freq: Every day | ORAL | 0 refills | Status: DC

## 2018-01-15 NOTE — Patient Instructions (Signed)
Mirabegron extended-release tablets What is this medicine? MIRABEGRON (MIR a BEG ron) is used to treat overactive bladder. This medicine reduces the amount of bathroom visits. It may also help to control wetting accidents. This medicine may be used for other purposes; ask your health care provider or pharmacist if you have questions. COMMON BRAND NAME(S): Myrbetriq What should I tell my health care provider before I take this medicine? They need to know if you have any of these conditions: -difficulty passing urine -high blood pressure -kidney disease -liver disease -an unusual or allergic reaction to mirabegron, other medicines, foods, dyes, or preservatives -pregnant or trying to get pregnant -breast-feeding How should I use this medicine? Take this medicine by mouth with a glass of water. Follow the directions on the prescription label. Do not cut, crush or chew this medicine. You can take it with or without food. If it upsets your stomach, take it with food. Take your medicine at regular intervals. Do not take it more often than directed. Do not stop taking except on your doctor's advice. Talk to your pediatrician regarding the use of this medicine in children. Special care may be needed. Overdosage: If you think you have taken too much of this medicine contact a poison control center or emergency room at once. NOTE: This medicine is only for you. Do not share this medicine with others. What if I miss a dose? If you miss a dose, take it as soon as you can. If it is almost time for your next dose, take only that dose. Do not take double or extra doses. What may interact with this medicine? -certain medicines for bladder problems like fesoterodine, oxybutynin, solifenacin, tolterodine -desipramine -digoxin -flecainide -ketoconazole -MAOIs like Carbex, Eldepryl, Marplan, Nardil, and Parnate -metoprolol -propafenone -thioridazine -warfarin This list may not describe all possible  interactions. Give your health care provider a list of all the medicines, herbs, non-prescription drugs, or dietary supplements you use. Also tell them if you smoke, drink alcohol, or use illegal drugs. Some items may interact with your medicine. What should I watch for while using this medicine? It may take 8 weeks to notice the full benefit from this medicine. You may need to limit your intake tea, coffee, caffeinated sodas, and alcohol. These drinks may make your symptoms worse. Visit your doctor or health care professional for regular checks on your progress. Check your blood pressure as directed. Ask your doctor or health care professional what your blood pressure should be and when you should contact him or her. What side effects may I notice from receiving this medicine? Side effects that you should report to your doctor or health care professional as soon as possible: -allergic reactions like skin rash, itching or hives, swelling of the face, lips, or tongue -chest pain or palpitations -severe or sudden headache -high blood pressure -fast, irregular heartbeat -redness, blistering, peeling or loosening of the skin, including inside the mouth -signs of infection like fever or chills; cough; sore throat; pain or difficulty passing urine -trouble passing urine or change in the amount of urine Side effects that usually do not require medical attention (report to your doctor or health care professional if they continue or are bothersome): -constipation -diarrhea -dizziness -dry eyes -joint pain -mild headache -nausea -runny nose This list may not describe all possible side effects. Call your doctor for medical advice about side effects. You may report side effects to FDA at 1-800-FDA-1088. Where should I keep my medicine? Keep out of the reach   of children. Store at room temperature between 15 and 30 degrees C (59 and 86 degrees F). Throw away any unused medicine after the expiration  date. NOTE: This sheet is a summary. It may not cover all possible information. If you have questions about this medicine, talk to your doctor, pharmacist, or health care provider.  2018 Elsevier/Gold Standard (2015-03-02 12:14:30)  

## 2018-01-16 LAB — TESTOSTERONE: TESTOSTERONE: 374 ng/dL (ref 264–916)

## 2018-01-30 DIAGNOSIS — E039 Hypothyroidism, unspecified: Secondary | ICD-10-CM | POA: Diagnosis not present

## 2018-01-30 DIAGNOSIS — R739 Hyperglycemia, unspecified: Secondary | ICD-10-CM | POA: Diagnosis not present

## 2018-01-30 DIAGNOSIS — E7849 Other hyperlipidemia: Secondary | ICD-10-CM | POA: Diagnosis not present

## 2018-01-30 DIAGNOSIS — E349 Endocrine disorder, unspecified: Secondary | ICD-10-CM | POA: Diagnosis not present

## 2018-02-05 ENCOUNTER — Other Ambulatory Visit: Payer: Self-pay

## 2018-02-05 DIAGNOSIS — E349 Endocrine disorder, unspecified: Secondary | ICD-10-CM

## 2018-02-06 ENCOUNTER — Other Ambulatory Visit: Payer: PPO

## 2018-02-06 ENCOUNTER — Encounter: Payer: Self-pay | Admitting: Urology

## 2018-02-06 DIAGNOSIS — Z23 Encounter for immunization: Secondary | ICD-10-CM | POA: Diagnosis not present

## 2018-02-06 DIAGNOSIS — Z0000 Encounter for general adult medical examination without abnormal findings: Secondary | ICD-10-CM | POA: Diagnosis not present

## 2018-02-06 DIAGNOSIS — R748 Abnormal levels of other serum enzymes: Secondary | ICD-10-CM | POA: Diagnosis not present

## 2018-02-06 DIAGNOSIS — E349 Endocrine disorder, unspecified: Secondary | ICD-10-CM | POA: Diagnosis not present

## 2018-02-06 DIAGNOSIS — R1013 Epigastric pain: Secondary | ICD-10-CM | POA: Diagnosis not present

## 2018-02-06 DIAGNOSIS — E039 Hypothyroidism, unspecified: Secondary | ICD-10-CM | POA: Diagnosis not present

## 2018-02-06 DIAGNOSIS — R7989 Other specified abnormal findings of blood chemistry: Secondary | ICD-10-CM | POA: Diagnosis not present

## 2018-02-06 DIAGNOSIS — K219 Gastro-esophageal reflux disease without esophagitis: Secondary | ICD-10-CM | POA: Diagnosis not present

## 2018-02-06 DIAGNOSIS — R739 Hyperglycemia, unspecified: Secondary | ICD-10-CM | POA: Diagnosis not present

## 2018-02-06 DIAGNOSIS — E7849 Other hyperlipidemia: Secondary | ICD-10-CM | POA: Diagnosis not present

## 2018-02-20 ENCOUNTER — Ambulatory Visit: Payer: PPO | Admitting: Urology

## 2018-02-26 ENCOUNTER — Encounter: Payer: Self-pay | Admitting: Urology

## 2018-02-26 ENCOUNTER — Ambulatory Visit (INDEPENDENT_AMBULATORY_CARE_PROVIDER_SITE_OTHER): Payer: PPO | Admitting: Urology

## 2018-02-26 VITALS — BP 180/65 | HR 84 | Ht 77.0 in | Wt 305.0 lb

## 2018-02-26 DIAGNOSIS — R748 Abnormal levels of other serum enzymes: Secondary | ICD-10-CM | POA: Diagnosis not present

## 2018-02-26 DIAGNOSIS — N3941 Urge incontinence: Secondary | ICD-10-CM | POA: Diagnosis not present

## 2018-02-26 DIAGNOSIS — R7989 Other specified abnormal findings of blood chemistry: Secondary | ICD-10-CM | POA: Diagnosis not present

## 2018-02-26 DIAGNOSIS — E039 Hypothyroidism, unspecified: Secondary | ICD-10-CM | POA: Diagnosis not present

## 2018-02-26 DIAGNOSIS — N401 Enlarged prostate with lower urinary tract symptoms: Secondary | ICD-10-CM | POA: Diagnosis not present

## 2018-02-26 DIAGNOSIS — R739 Hyperglycemia, unspecified: Secondary | ICD-10-CM | POA: Diagnosis not present

## 2018-02-26 DIAGNOSIS — N138 Other obstructive and reflux uropathy: Secondary | ICD-10-CM

## 2018-02-26 LAB — BLADDER SCAN AMB NON-IMAGING

## 2018-02-26 MED ORDER — MIRABEGRON ER 25 MG PO TB24: 25.0000 mg | ORAL_TABLET | Freq: Every day | ORAL | 3 refills | Status: DC

## 2018-02-26 NOTE — Progress Notes (Signed)
02/26/2018  11:56 AM   Brian Banks 03-Apr-1945 [id number]  Referring provider: Adin Hector, MD [facility] [place], [place] [address]  Chief Complaint  Patient presents with  . Benign Prostatic Hypertrophy   HPI: Brian Banks is a 73 y.o. Caucasian male with a history of BPH with LUTS who presents today for a one year follow-up.  BPH with LUTS His IPSS score today is 6/2. He reports mild lower urinary tract symptomatology which is improved since starting Myrbetriq. He is mostly satisfied with his quality of life due to his symptoms at this time. This is an outlook improvement for him along with his symptom improvement.  He reports that he is still experiencing urgency but is no longer experiencing episodes of urinary incontinence. He feels like his feeling "more normal".  His PVR today is 29 mL, which is good.  He also denies any recent fevers, chills, nausea or vomiting.  He has a family history of PCa, with his brother being diagnosed with prostate cancer. He reports that his brother's cancer has progressed and bedridden in hospice.  His major complaints today are urgency and urge incontinence for 6 months.  He denies any dysuria, hematuria or suprapubic pain.     He currently taking finasteride 5 mg daily. His last PSA was 0.7 ng/mL on 12/31/2017  IPSS    Row Name 02/26/18 1100         International Prostate Symptom Score   How often have you had the sensation of not emptying your bladder?  Less than 1 in 5     How often have you had to urinate less than every two hours?  Less than 1 in 5 times     How often have you found you stopped and started again several times when you urinated?  Not at All     How often have you found it difficult to postpone urination?  Less than half the time     How often have you had a weak urinary stream?  Less than 1 in 5 times     How often have you had to strain to start urination?  Not at All     How  many times did you typically get up at night to urinate?  1 Time     Total IPSS Score  6       Quality of Life due to urinary symptoms   If you were to spend the rest of your life with your urinary condition just the way it is now how would you feel about that?  Mostly Satisfied      Score:  1-7 Mild 8-19 Moderate 20-35 Severe  History of testosterone deficiency Previously on testosterone. His last Testosterone (01/15/2018) was 374, which is within our goal range.  PMH: Past Medical History:  Diagnosis Date  . Benign neoplasm of large bowel   . BPH with obstruction/lower urinary tract symptoms   . Dysphagia   . Erectile dysfunction   . Heartburn   . History of kidney stones   . History of stomach ulcers   . HLD (hyperlipidemia)   . Hypogonadism in male   . Obesity   . Urinary frequency   . Urinary urgency   . Vitamin D deficiency     Surgical History: Past Surgical History:  Procedure Laterality Date  . APPENDECTOMY      Home Medications:  Allergies as of 02/26/2018  Reactions   Androgel [testosterone]    Lovastatin       Medication List       Accurate as of February 26, 2018 11:56 AM. Always use your most recent med list.        finasteride 5 MG tablet Commonly known as:  PROSCAR Take 1 tablet (5 mg total) by mouth daily.   Glucosamine-Chondroitin 750-600 MG Tabs Take by mouth.   mirabegron ER 25 MG Tb24 tablet Commonly known as:  MYRBETRIQ Take 1 tablet (25 mg total) by mouth daily.   omeprazole 40 MG capsule Commonly known as:  PRILOSEC Take by mouth.       Allergies:  Allergies  Allergen Reactions  . Androgel [Testosterone]   . Lovastatin     Family History: Family History  Problem Relation Age of Onset  . Alzheimer's disease Father   . Prostate cancer Brother   . Diabetes Mellitus II Mother   . Stroke Mother     Social History:  reports that he has been smoking cigars. He has never used smokeless tobacco. He reports current  alcohol use. He reports that he does not use drugs.  ROS: UROLOGY Frequent Urination?: No Hard to postpone urination?: No Burning/pain with urination?: No Get up at night to urinate?: No Leakage of urine?: No Urine stream starts and stops?: No Trouble starting stream?: No Do you have to strain to urinate?: No Blood in urine?: No Urinary tract infection?: No Sexually transmitted disease?: No Injury to kidneys or bladder?: No Painful intercourse?: No Weak stream?: No Erection problems?: No Penile pain?: No  Gastrointestinal Nausea?: No Vomiting?: No Indigestion/heartburn?: No Diarrhea?: No Constipation?: No  Constitutional Fever: No Night sweats?: No Weight loss?: No Fatigue?: No  Skin Skin rash/lesions?: No Itching?: No  Eyes Blurred vision?: No Double vision?: No  Ears/Nose/Throat Sore throat?: No Sinus problems?: No  Hematologic/Lymphatic Swollen glands?: No Easy bruising?: No  Cardiovascular Leg swelling?: No Chest pain?: No  Respiratory Cough?: No Shortness of breath?: No  Endocrine Excessive thirst?: No  Musculoskeletal Back pain?: No Joint pain?: No  Neurological Headaches?: No Dizziness?: No  Psychologic Depression?: No Anxiety?: No  Physical Exam: BP (!) 180/65   Pulse 84   Ht 6\' 5"  (1.956 m)   Wt (!) 305 lb (138.3 kg)   BMI 36.17 kg/m   Constitutional: Well nourished. Alert and oriented, No acute distress. Respiratory: Normal respiratory effort, no increased work of breathing.  Head: Normocephalic and Atraumatic. GU: No CVA tenderness.   Skin: No rashes, bruises or suspicious lesions. Neurologic: Grossly intact, no focal deficits, moving all 4 extremities. Psychiatric: Normal mood and affect.  Laboratory Data:  PSA history  1.2 ng/mL on 05/26/2013  1.0 ng/mL on 12/10/2013  1.0 ng/mL on 06/17/2014  1.0 ng/mL on 12/26/2015  0.8 ng/mL in 12/2016  0.7 ng/mL on 12/31/2017 I have reviewed the labs.  Results for  orders placed or performed in visit on 02/26/18  Bladder Scan (Post Void Residual) in office  Result Value Ref Range   Scan Result 97ml    Assessment & Plan:   1. BPH with LUTS  - IPSS score is 6/2, it is improved. Patient has an improved outlook regarding his urinary symptoms  - He experiences urgency but after starting Myrbetriq, he has not experienced any episodes of urinary incontinence. Continue Myrbetriq 25 mg qd, sent to pharmacy  - His symptoms are well managed on finasteride 5mg  qd, refills sent  - Continue conservative management, avoiding bladder irritants  and timed voiding's  - RTC in 12 months for IPSS, PSA and exam   2. Low libido/history of testosterone deficiency  - Previously on TRT. His last Testosterone (01/15/2018) was 374, which is within our goal range.  3. Urge incontinence  - See Above   Return in about 1 year (around 02/27/2019) for IPSS, PSA and exam.  Zara Council, PA-C [address] [place], [place] [address] [phone]  I, Temidayo Atanda-Ogunleye , am acting as a scribe for [facility], PA-C  I have reviewed the above documentation for accuracy and completeness, and I agree with the above.    Zara Council, PA-C

## 2018-02-27 DIAGNOSIS — K760 Fatty (change of) liver, not elsewhere classified: Secondary | ICD-10-CM | POA: Insufficient documentation

## 2018-07-09 DIAGNOSIS — K219 Gastro-esophageal reflux disease without esophagitis: Secondary | ICD-10-CM | POA: Diagnosis not present

## 2018-07-09 DIAGNOSIS — K200 Eosinophilic esophagitis: Secondary | ICD-10-CM | POA: Diagnosis not present

## 2018-07-31 DIAGNOSIS — E7849 Other hyperlipidemia: Secondary | ICD-10-CM | POA: Diagnosis not present

## 2018-07-31 DIAGNOSIS — R7989 Other specified abnormal findings of blood chemistry: Secondary | ICD-10-CM | POA: Diagnosis not present

## 2018-07-31 DIAGNOSIS — E349 Endocrine disorder, unspecified: Secondary | ICD-10-CM | POA: Diagnosis not present

## 2018-07-31 DIAGNOSIS — K219 Gastro-esophageal reflux disease without esophagitis: Secondary | ICD-10-CM | POA: Diagnosis not present

## 2018-07-31 DIAGNOSIS — R739 Hyperglycemia, unspecified: Secondary | ICD-10-CM | POA: Diagnosis not present

## 2018-07-31 DIAGNOSIS — R1013 Epigastric pain: Secondary | ICD-10-CM | POA: Diagnosis not present

## 2018-08-10 DIAGNOSIS — E7849 Other hyperlipidemia: Secondary | ICD-10-CM | POA: Diagnosis not present

## 2018-08-10 DIAGNOSIS — R7989 Other specified abnormal findings of blood chemistry: Secondary | ICD-10-CM | POA: Diagnosis not present

## 2018-08-10 DIAGNOSIS — K760 Fatty (change of) liver, not elsewhere classified: Secondary | ICD-10-CM | POA: Diagnosis not present

## 2018-08-10 DIAGNOSIS — R1013 Epigastric pain: Secondary | ICD-10-CM | POA: Diagnosis not present

## 2018-08-10 DIAGNOSIS — R739 Hyperglycemia, unspecified: Secondary | ICD-10-CM | POA: Diagnosis not present

## 2018-08-10 DIAGNOSIS — K219 Gastro-esophageal reflux disease without esophagitis: Secondary | ICD-10-CM | POA: Diagnosis not present

## 2019-02-03 DIAGNOSIS — E7849 Other hyperlipidemia: Secondary | ICD-10-CM | POA: Diagnosis not present

## 2019-02-03 DIAGNOSIS — R739 Hyperglycemia, unspecified: Secondary | ICD-10-CM | POA: Diagnosis not present

## 2019-02-03 DIAGNOSIS — K219 Gastro-esophageal reflux disease without esophagitis: Secondary | ICD-10-CM | POA: Diagnosis not present

## 2019-02-03 DIAGNOSIS — R1013 Epigastric pain: Secondary | ICD-10-CM | POA: Diagnosis not present

## 2019-02-03 DIAGNOSIS — K760 Fatty (change of) liver, not elsewhere classified: Secondary | ICD-10-CM | POA: Diagnosis not present

## 2019-02-10 DIAGNOSIS — E7849 Other hyperlipidemia: Secondary | ICD-10-CM | POA: Diagnosis not present

## 2019-02-10 DIAGNOSIS — R1013 Epigastric pain: Secondary | ICD-10-CM | POA: Diagnosis not present

## 2019-02-10 DIAGNOSIS — N401 Enlarged prostate with lower urinary tract symptoms: Secondary | ICD-10-CM | POA: Diagnosis not present

## 2019-02-10 DIAGNOSIS — N3943 Post-void dribbling: Secondary | ICD-10-CM | POA: Diagnosis not present

## 2019-02-10 DIAGNOSIS — R748 Abnormal levels of other serum enzymes: Secondary | ICD-10-CM | POA: Diagnosis not present

## 2019-02-10 DIAGNOSIS — K760 Fatty (change of) liver, not elsewhere classified: Secondary | ICD-10-CM | POA: Diagnosis not present

## 2019-02-10 DIAGNOSIS — R739 Hyperglycemia, unspecified: Secondary | ICD-10-CM | POA: Diagnosis not present

## 2019-02-10 DIAGNOSIS — K219 Gastro-esophageal reflux disease without esophagitis: Secondary | ICD-10-CM | POA: Diagnosis not present

## 2019-02-10 DIAGNOSIS — Z0000 Encounter for general adult medical examination without abnormal findings: Secondary | ICD-10-CM | POA: Diagnosis not present

## 2019-02-10 DIAGNOSIS — L989 Disorder of the skin and subcutaneous tissue, unspecified: Secondary | ICD-10-CM | POA: Diagnosis not present

## 2019-02-10 DIAGNOSIS — E349 Endocrine disorder, unspecified: Secondary | ICD-10-CM | POA: Diagnosis not present

## 2019-02-19 ENCOUNTER — Other Ambulatory Visit: Payer: Self-pay | Admitting: *Deleted

## 2019-02-19 DIAGNOSIS — N138 Other obstructive and reflux uropathy: Secondary | ICD-10-CM

## 2019-02-22 ENCOUNTER — Other Ambulatory Visit: Payer: Self-pay

## 2019-02-22 ENCOUNTER — Other Ambulatory Visit: Payer: PPO

## 2019-02-22 DIAGNOSIS — N138 Other obstructive and reflux uropathy: Secondary | ICD-10-CM | POA: Diagnosis not present

## 2019-02-22 DIAGNOSIS — N401 Enlarged prostate with lower urinary tract symptoms: Secondary | ICD-10-CM | POA: Diagnosis not present

## 2019-02-23 LAB — PSA: Prostate Specific Ag, Serum: 0.5 ng/mL (ref 0.0–4.0)

## 2019-02-25 NOTE — Progress Notes (Signed)
02/26/2019  9:27 AM   Brian Banks 1945/10/08 [id number]  Referring provider: Adin Hector, MD [facility] [place],  [place] [address]  Chief Complaint  Patient presents with  . Benign Prostatic Hypertrophy   HPI: Brian Banks is a 74 y.o. male with a history of BPH with LUTS who presents today for a one year follow-up.  BPH WITH LUTS  (prostate and/or bladder) IPSS score: 8/2    PVR: 0 mL     Previous score: 6/2  Previous PVR: 29 mL  Major complaint(s):  Frequency, urgency, nocturia and incontinence.  No cross sectional imaging available so prostate size unknown   Denies any dysuria, hematuria or suprapubic pain.   Currently taking: Proscar 5 mg daily and Myrbetriq 25 mg daily  Denies any recent fevers, chills, nausea or vomiting.  His brother had colon cancer, not prostate cancer.   IPSS    Row Name 02/26/19 0900         International Prostate Symptom Score   How often have you had the sensation of not emptying your bladder?  Not at All     How often have you had to urinate less than every two hours?  Less than half the time     How often have you found you stopped and started again several times when you urinated?  Not at All     How often have you found it difficult to postpone urination?  About half the time     How often have you had a weak urinary stream?  Less than 1 in 5 times     How often have you had to strain to start urination?  Not at All     How many times did you typically get up at night to urinate?  2 Times     Total IPSS Score  8       Quality of Life due to urinary symptoms   If you were to spend the rest of your life with your urinary condition just the way it is now how would you feel about that?  Mostly Satisfied        Score:  1-7 Mild 8-19 Moderate 20-35 Severe   PMH: Past Medical History:  Diagnosis Date  . Benign neoplasm of large bowel   . BPH with obstruction/lower urinary tract symptoms    . Dysphagia   . Erectile dysfunction   . Heartburn   . History of kidney stones   . History of stomach ulcers   . HLD (hyperlipidemia)   . Hypogonadism in male   . Obesity   . Urinary frequency   . Urinary urgency   . Vitamin D deficiency     Surgical History: Past Surgical History:  Procedure Laterality Date  . APPENDECTOMY      Home Medications:  Allergies as of 02/26/2019      Reactions   Androgel [testosterone]    Lovastatin       Medication List       Accurate as of February 26, 2019  9:27 AM. If you have any questions, ask your nurse or doctor.        finasteride 5 MG tablet Commonly known as: PROSCAR Take 1 tablet (5 mg total) by mouth daily.   Glucosamine-Chondroitin 750-600 MG Tabs Take by mouth.   mirabegron ER 25 MG Tb24 tablet Commonly known as: MYRBETRIQ Take 1 tablet (25 mg total) by mouth daily.  omeprazole 40 MG capsule Commonly known as: PRILOSEC Take by mouth.       Allergies:  Allergies  Allergen Reactions  . Androgel [Testosterone]   . Lovastatin     Family History: Family History  Problem Relation Age of Onset  . Alzheimer's disease Father   . Prostate cancer Brother   . Diabetes Mellitus II Mother   . Stroke Mother     Social History:  reports that he has been smoking cigars. He has never used smokeless tobacco. He reports current alcohol use. He reports that he does not use drugs.  ROS: UROLOGY Frequent Urination?: Yes Hard to postpone urination?: Yes Burning/pain with urination?: No Get up at night to urinate?: Yes Leakage of urine?: Yes Urine stream starts and stops?: No Trouble starting stream?: No Do you have to strain to urinate?: No Blood in urine?: No Urinary tract infection?: No Sexually transmitted disease?: No Injury to kidneys or bladder?: No Painful intercourse?: No Weak stream?: No Erection problems?: No Penile pain?: No  Gastrointestinal Nausea?: No Vomiting?: No Indigestion/heartburn?:  No Diarrhea?: No Constipation?: No  Constitutional Fever: No Night sweats?: No Weight loss?: No Fatigue?: No  Skin Skin rash/lesions?: No Itching?: No  Eyes Blurred vision?: No Double vision?: No  Ears/Nose/Throat Sore throat?: No Sinus problems?: No  Hematologic/Lymphatic Swollen glands?: No Easy bruising?: No  Cardiovascular Leg swelling?: No Chest pain?: No  Respiratory Cough?: No Shortness of breath?: No  Endocrine Excessive thirst?: No  Musculoskeletal Back pain?: No Joint pain?: No  Neurological Headaches?: No Dizziness?: No  Psychologic Depression?: No Anxiety?: No  Physical Exam: BP 128/79   Pulse 74   Ht 6\' 5"  (1.956 m)   Wt 298 lb (135.2 kg)   BMI 35.34 kg/m   Constitutional:  Well nourished. Alert and oriented, No acute distress. HEENT: [place] AT, mask in place.  Trachea midline, no masses. Cardiovascular: No clubbing, cyanosis, or edema. Respiratory: Normal respiratory effort, no increased work of breathing. GI: Abdomen is soft, non tender, non distended, no abdominal masses. Liver and spleen not palpable.  No hernias appreciated.  Stool sample for occult testing is not indicated.   GU: No CVA tenderness.  No bladder fullness or masses.  Patient with uncircumcised phallus.  Foreskin easily retracted  Urethral meatus is patent.  No penile discharge. No penile lesions or rashes. Scrotum without lesions, cysts, rashes and/or edema.  Testicles are located scrotally bilaterally. No masses are appreciated in the testicles. Left and right epididymis are normal. Rectal: Patient with  normal sphincter tone. Anus and perineum without scarring or rashes. No rectal masses are appreciated. Prostate is approximately 50 grams, could only palpate the apex and midportion of the gland, no nodules are appreciated. Seminal vesicles could not be palpated.  Skin: No rashes, bruises or suspicious lesions. Lymph: No inguinal adenopathy. Neurologic: Grossly intact, no  focal deficits, moving all 4 extremities. Psychiatric: Normal mood and affect.  Laboratory Data:  PSA history  1.2 ng/mL on 05/26/2013  1.0 ng/mL on 12/10/2013   Component     Latest Ref Rng & Units 12/16/2014 12/26/2015 12/20/2016 12/31/2017  Prostate Specific Ag, Serum     0.0 - 4.0 ng/mL 1.0 1.0 0.8 0.7   Component     Latest Ref Rng & Units 02/22/2019  Prostate Specific Ag, Serum     0.0 - 4.0 ng/mL 0.5   I have reviewed the labs.  Pertinent Imaging Results for Brian, Banks "Brian Banks" (MRN [id number]) as of 02/26/2019 09:20  Ref.  Range 02/26/2019 08:58  Scan Result Unknown 0   Assessment & Plan:    1. BPH with LUTS IPSS score is 8/2, it is worsening Continue conservative management, avoiding bladder irritants and timed voiding's Most bothersome symptoms is/are seepage of urine and frequency - no longer finding Myrbetriq effective Continue finasteride 5 mg daily - refills given Still having significant urinary symptoms despite Proscar and Myrbetriq - will schedule cystoscopy for further evaluation of symptoms  I have explained to the patient that they will  be scheduled for a cystoscopy in our office to evaluate their bladder.  The cystoscopy consists of passing a tube with a lens up through their urethra and into their urinary bladder.   We will inject the urethra with a lidocaine gel prior to introducing the cystoscope to help with any discomfort during the procedure.   After the procedure, they might experience blood in the urine and discomfort with urination.  This will abate after the first few voids.  I have  encouraged the patient to increase water intake  during this time.  Patient denies any allergies to lidocaine.   2. Urge incontinence  - See Above   Return for cystoscopy for BOO .  Zara Council, PA-C [facility] [address] [place], [place] [address] [phone]

## 2019-02-26 ENCOUNTER — Other Ambulatory Visit: Payer: Self-pay

## 2019-02-26 ENCOUNTER — Encounter: Payer: Self-pay | Admitting: Urology

## 2019-02-26 ENCOUNTER — Ambulatory Visit: Payer: PPO | Admitting: Urology

## 2019-02-26 VITALS — BP 128/79 | HR 74 | Ht 77.0 in | Wt 298.0 lb

## 2019-02-26 DIAGNOSIS — N401 Enlarged prostate with lower urinary tract symptoms: Secondary | ICD-10-CM

## 2019-02-26 DIAGNOSIS — N3941 Urge incontinence: Secondary | ICD-10-CM

## 2019-02-26 DIAGNOSIS — N138 Other obstructive and reflux uropathy: Secondary | ICD-10-CM | POA: Diagnosis not present

## 2019-02-26 LAB — BLADDER SCAN AMB NON-IMAGING: Scan Result: 0

## 2019-02-26 MED ORDER — MIRABEGRON ER 25 MG PO TB24: 25.0000 mg | ORAL_TABLET | Freq: Every day | ORAL | 3 refills | Status: DC

## 2019-02-26 MED ORDER — FINASTERIDE 5 MG PO TABS: 5.0000 mg | ORAL_TABLET | Freq: Every day | ORAL | 3 refills | Status: DC

## 2019-03-09 DIAGNOSIS — L728 Other follicular cysts of the skin and subcutaneous tissue: Secondary | ICD-10-CM | POA: Diagnosis not present

## 2019-03-09 DIAGNOSIS — D2271 Melanocytic nevi of right lower limb, including hip: Secondary | ICD-10-CM | POA: Diagnosis not present

## 2019-03-09 DIAGNOSIS — D225 Melanocytic nevi of trunk: Secondary | ICD-10-CM | POA: Diagnosis not present

## 2019-03-09 DIAGNOSIS — D2261 Melanocytic nevi of right upper limb, including shoulder: Secondary | ICD-10-CM | POA: Diagnosis not present

## 2019-03-09 DIAGNOSIS — L538 Other specified erythematous conditions: Secondary | ICD-10-CM | POA: Diagnosis not present

## 2019-03-09 DIAGNOSIS — D2262 Melanocytic nevi of left upper limb, including shoulder: Secondary | ICD-10-CM | POA: Diagnosis not present

## 2019-03-09 DIAGNOSIS — X32XXXA Exposure to sunlight, initial encounter: Secondary | ICD-10-CM | POA: Diagnosis not present

## 2019-03-09 DIAGNOSIS — L720 Epidermal cyst: Secondary | ICD-10-CM | POA: Diagnosis not present

## 2019-03-09 DIAGNOSIS — L570 Actinic keratosis: Secondary | ICD-10-CM | POA: Diagnosis not present

## 2019-03-26 ENCOUNTER — Encounter: Payer: Self-pay | Admitting: Urology

## 2019-03-26 ENCOUNTER — Other Ambulatory Visit: Payer: Self-pay

## 2019-03-26 ENCOUNTER — Ambulatory Visit (INDEPENDENT_AMBULATORY_CARE_PROVIDER_SITE_OTHER): Payer: PPO | Admitting: Urology

## 2019-03-26 VITALS — BP 135/78 | HR 75 | Ht 77.0 in | Wt 298.0 lb

## 2019-03-26 DIAGNOSIS — N3941 Urge incontinence: Secondary | ICD-10-CM | POA: Diagnosis not present

## 2019-03-26 LAB — URINALYSIS, COMPLETE
Bilirubin, UA: NEGATIVE
Glucose, UA: NEGATIVE
Ketones, UA: NEGATIVE
Leukocytes,UA: NEGATIVE
Nitrite, UA: NEGATIVE
Protein,UA: NEGATIVE
RBC, UA: NEGATIVE
Specific Gravity, UA: >1.03 — ABNORMAL HIGH (ref 1.005–1.030)
Urobilinogen, Ur: 0.2 mg/dL (ref 0.2–1.0)
pH, UA: 5 (ref 5.0–7.5)

## 2019-03-26 LAB — MICROSCOPIC EXAMINATION
Bacteria, UA: NONE SEEN
RBC, Urine: NONE SEEN /hpf (ref 0–2)
WBC, UA: NONE SEEN /hpf (ref 0–5)

## 2019-03-26 NOTE — Progress Notes (Signed)
   03/26/19  CC:  Chief Complaint  Patient presents with  . Cysto    HPI: 74 y.o. male with worsening lower urinary tract symptoms.  Refer to Shannon's note 02/26/2019.  A decided to discontinue the Myrbetriq and states his storage related symptoms have resolved.  Blood pressure 135/78, pulse 75, height 6\' 5"  (1.956 m), weight 298 lb (135.2 kg). NED. A&Ox3.     Cystoscopy Procedure Note  Patient identification was confirmed, informed consent was obtained, and patient was prepped using Betadine solution.  Lidocaine jelly was administered per urethral meatus.     Pre-Procedure: - Inspection reveals a normal caliber urethral meatus.  Procedure: The flexible cystoscope was introduced without difficulty - No urethral strictures/lesions are present. - Moderate lateral lobe enlargement prostate  - Mild elevation bladder neck - Bilateral ureteral orifices identified - Bladder mucosa  reveals no ulcers, tumors, or lesions - No bladder stones - No trabeculation  Retroflexion shows no intravesical median lobe or mucosal abnormalities   Post-Procedure: - Patient tolerated the procedure well  Assessment/ Plan: -Moderate prostate enlargement; no gross abnormalities -Currently satisfied with his voiding pattern off Myrbetriq. -Follow-up with [facility] 1 year or prn worsening LUTS   Abbie Sons, MD

## 2019-08-04 DIAGNOSIS — K219 Gastro-esophageal reflux disease without esophagitis: Secondary | ICD-10-CM | POA: Diagnosis not present

## 2019-08-04 DIAGNOSIS — R739 Hyperglycemia, unspecified: Secondary | ICD-10-CM | POA: Diagnosis not present

## 2019-08-04 DIAGNOSIS — E7849 Other hyperlipidemia: Secondary | ICD-10-CM | POA: Diagnosis not present

## 2019-09-02 DIAGNOSIS — K219 Gastro-esophageal reflux disease without esophagitis: Secondary | ICD-10-CM | POA: Diagnosis not present

## 2019-09-02 DIAGNOSIS — E7849 Other hyperlipidemia: Secondary | ICD-10-CM | POA: Diagnosis not present

## 2019-09-02 DIAGNOSIS — E349 Endocrine disorder, unspecified: Secondary | ICD-10-CM | POA: Diagnosis not present

## 2019-09-02 DIAGNOSIS — R739 Hyperglycemia, unspecified: Secondary | ICD-10-CM | POA: Diagnosis not present

## 2019-09-02 DIAGNOSIS — K760 Fatty (change of) liver, not elsewhere classified: Secondary | ICD-10-CM | POA: Diagnosis not present

## 2019-09-02 DIAGNOSIS — R7989 Other specified abnormal findings of blood chemistry: Secondary | ICD-10-CM | POA: Diagnosis not present

## 2020-01-26 DIAGNOSIS — K219 Gastro-esophageal reflux disease without esophagitis: Secondary | ICD-10-CM | POA: Diagnosis not present

## 2020-01-27 NOTE — Progress Notes (Deleted)
01/28/2020  8:54 AM   Brian Banks April 11, 1945 [id number]  Referring provider: Adin Hector, MD [facility] [place],  [place] [address]  No chief complaint on file.  HPI: Brian Banks is a 74 y.o. male with BPH with LUTS who presents today for a one year follow-up.  BPH WITH LUTS  (prostate and/or bladder) IPSS score: ***   PVR: *** mL     Previous score: 8/2  Previous PVR: 0 mL  Major complaint(s): ***   Patient denies any modifying or aggravating factors.  Patient denies any gross hematuria, dysuria or suprapubic/flank pain.  Patient denies any fevers, chills, nausea or vomiting.   Currently taking: Proscar 5 mg daily and Myrbetriq 25 mg daily  Cysto with Dr. Bernardo Heater 03/26/2019 Moderate lateral lobe enlargement prostate.  Mild elevation bladder neck  His brother had colon cancer, not prostate cancer.     Score:  1-7 Mild 8-19 Moderate 20-35 Severe   PMH: Past Medical History:  Diagnosis Date  . Benign neoplasm of large bowel   . BPH with obstruction/lower urinary tract symptoms   . Dysphagia   . Erectile dysfunction   . Heartburn   . History of kidney stones   . History of stomach ulcers   . HLD (hyperlipidemia)   . Hypogonadism in male   . Obesity   . Urinary frequency   . Urinary urgency   . Vitamin D deficiency     Surgical History: Past Surgical History:  Procedure Laterality Date  . APPENDECTOMY      Home Medications:  Allergies as of 01/28/2020      Reactions   Androgel [testosterone]    Lovastatin       Medication List       Accurate as of January 27, 2020  8:54 AM. If you have any questions, ask your nurse or doctor.        finasteride 5 MG tablet Commonly known as: PROSCAR Take 1 tablet (5 mg total) by mouth daily.   Glucosamine-Chondroitin 750-600 MG Tabs Take by mouth.   mirabegron ER 25 MG Tb24 tablet Commonly known as: MYRBETRIQ Take 1 tablet (25 mg total) by mouth daily.    omeprazole 40 MG capsule Commonly known as: PRILOSEC Take by mouth.       Allergies:  Allergies  Allergen Reactions  . Androgel [Testosterone]   . Lovastatin     Family History: Family History  Problem Relation Age of Onset  . Alzheimer's disease Father   . Prostate cancer Brother   . Diabetes Mellitus II Mother   . Stroke Mother     Social History:  reports that he has been smoking cigars. He has never used smokeless tobacco. He reports current alcohol use. He reports that he does not use drugs.  ROS: For pertinent review of systems please refer to history of present illness  Physical Exam: There were no vitals taken for this visit.  Constitutional:  Well nourished. Alert and oriented, No acute distress. HEENT: [place] AT, moist mucus membranes.  Trachea midline Cardiovascular: No clubbing, cyanosis, or edema. Respiratory: Normal respiratory effort, no increased work of breathing. GI: Abdomen is soft, non tender, non distended, no abdominal masses. Liver and spleen not palpable.  No hernias appreciated.  Stool sample for occult testing is not indicated.   GU: No CVA tenderness.  No bladder fullness or masses.  Patient with circumcised/uncircumcised phallus. ***Foreskin easily retracted***  Urethral meatus is patent.  No penile discharge. No penile lesions or rashes. Scrotum without lesions, cysts, rashes and/or edema.  Testicles are located scrotally bilaterally. No masses are appreciated in the testicles. Left and right epididymis are normal. Rectal: Patient with  normal sphincter tone. Anus and perineum without scarring or rashes. No rectal masses are appreciated. Prostate is approximately *** grams, *** nodules are appreciated. Seminal vesicles are normal. Skin: No rashes, bruises or suspicious lesions. Lymph: No inguinal adenopathy. Neurologic: Grossly intact, no focal deficits, moving all 4 extremities. Psychiatric: Normal mood and affect.  Laboratory Data: Specimen:   Urine  Ref Range & Units 5 mo ago  Color Yellow Yellow   Clarity Clear Clear   Specific Gravity 1.000 - 1.030 >=1.030   pH, Urine 5.0 - 8.0 6.0   Protein, Urinalysis Negative, Trace mg/dL Negative   Glucose, Urinalysis Negative mg/dL Negative   Ketones, Urinalysis Negative mg/dL Negative   Blood, Urinalysis Negative Negative   Nitrite, Urinalysis Negative Negative   Leukocyte Esterase, Urinalysis Negative Negative   White Blood Cells, Urinalysis None Seen, 0-3 /hpf 0-3   Red Blood Cells, Urinalysis None Seen, 0-3 /hpf None Seen   Bacteria, Urinalysis None Seen /hpf None Seen   Squamous Epithelial Cells, Urinalysis Rare, Few, None Seen /hpf None Seen   Resulting Agency  [facility] - [facility]  Specimen Collected: 08/04/19 7:19 AM Last Resulted: 08/04/19 8:21 AM  Received From: [place]  Result Received: 01/27/20 8:54 AM   Specimen:  Blood  Ref Range & Units 5 mo ago  Hemoglobin A1C 4.2 - 5.6 % 6.3High   Average Blood Glucose (Calc) mg/dL 134   Resulting Agency  [facility] - [facility]   Narrative  Normal Range:  4.2 - 5.6%  Increased Risk: 5.7 - 6.4%  Diabetes:    >= 6.5%  Glycemic Control for adults with diabetes: <7%  Specimen Collected: 08/04/19 7:19 AM Last Resulted: 08/04/19 11:16 AM  Received From: [facility]  Result Received: 01/27/20 8:54 AM   Specimen:  Blood  Ref Range & Units 5 mo ago  Glucose 70 - 110 mg/dL 140High   Sodium 136 - 145 mmol/L 141   Potassium 3.6 - 5.1 mmol/L 4.0   Chloride 97 - 109 mmol/L 108   Carbon Dioxide (CO2) 22.0 - 32.0 mmol/L 26.8   Urea Nitrogen (BUN) 7 - 25 mg/dL 17   Creatinine 0.7 - 1.3 mg/dL 0.9   Glomerular Filtration Rate (eGFR), MDRD Estimate >60 mL/min/1.73sq m 83   Calcium 8.7 - 10.3 mg/dL 8.8   AST  8 - 39 U/L 19   ALT  6 - 57 U/L 33   Alk Phos (alkaline Phosphatase) 34 - 104 U/L 49   Albumin 3.5 - 4.8 g/dL 4.1   Bilirubin, Total 0.3 - 1.2 mg/dL 0.4    Protein, Total 6.1 - 7.9 g/dL 6.4   A/G Ratio 1.0 - 5.0 gm/dL 1.8   Resulting Agency  [place] - [facility]  Specimen Collected: 08/04/19 7:19 AM Last Resulted: 08/04/19 10:23 AM  Received From: [place]  Result Received: 01/27/20 8:54 AM   Related to Thyroid Stimulating-Hormone (TSH) Component 08/04/19 02/03/19 07/31/18 02/26/18 01/30/18 08/29/17  Thyroid Stimulating Hormone (TSH) 5.530 4.321 7.474High 4.045 6.247High 4.597     Specimen:  Blood  Ref Range & Units 5 mo ago  WBC (White Blood Cell Count) 4.1 - 10.2 10^3/uL 6.2   RBC (Red Blood Cell Count) 4.69 - 6.13 10^6/uL 4.61Low  Hemoglobin 14.1 - 18.1 gm/dL 14.3   Hematocrit 40.0 - 52.0 % 43.0   MCV (Mean Corpuscular Volume) 80.0 - 100.0 fl 93.3   MCH (Mean Corpuscular Hemoglobin) 27.0 - 31.2 pg 31.0   MCHC (Mean Corpuscular Hemoglobin Concentration) 32.0 - 36.0 gm/dL 33.3   Platelet Count 150 - 450 10^3/uL 212   RDW-CV (Red Cell Distribution Width) 11.6 - 14.8 % 12.5   MPV (Mean Platelet Volume) 9.4 - 12.4 fl 10.2   Neutrophils 1.50 - 7.80 10^3/uL 3.94   Lymphocytes 1.00 - 3.60 10^3/uL 1.60   Monocytes 0.00 - 1.50 10^3/uL 0.38   Eosinophils 0.00 - 0.55 10^3/uL 0.18   Basophils 0.00 - 0.09 10^3/uL 0.03   Neutrophil % 32.0 - 70.0 % 64.1   Lymphocyte % 10.0 - 50.0 % 26.0   Monocyte % 4.0 - 13.0 % 6.2   Eosinophil % 1.0 - 5.0 % 2.9   Basophil% 0.0 - 2.0 % 0.5   Immature Granulocyte % <=0.7 % 0.3   Immature Granulocyte Count <=0.06 10^3/L 0.02   Resulting Agency  [place] - [facility]  Specimen Collected: 08/04/19 7:19 AM Last Resulted: 08/04/19 8:44 AM  Received From: [place]  Result Received: 01/27/20 8:54 AM   Specimen:  Blood  Ref Range & Units 5 mo ago  Cholesterol, Total 100 - 200 mg/dL 173   Triglyceride 35 - 199 mg/dL 145   HDL (High Density Lipoprotein) Cholesterol 29.0 - 71.0 mg/dL 36.4   LDL Calculated 0 - 130 mg/dL 108   VLDL Cholesterol mg/dL  29   Cholesterol/HDL Ratio  4.8   Resulting Agency  [place] - [facility]  Specimen Collected: 08/04/19 7:19 AM Last Resulted: 08/04/19 10:23 AM  Received From: [place]  Result Received: 01/27/20 8:54 AM    PSA history  1.2 ng/mL on 05/26/2013  1.0 ng/mL on 12/10/2013   Component     Latest Ref Rng & Units 12/16/2014 12/26/2015 12/20/2016 12/31/2017  Prostate Specific Ag, Serum     0.0 - 4.0 ng/mL 1.0 1.0 0.8 0.7   Component     Latest Ref Rng & Units 02/22/2019  Prostate Specific Ag, Serum     0.0 - 4.0 ng/mL 0.5   I have reviewed the labs.  Pertinent Imaging No recent imaging  Assessment & Plan:    1. BPH with LUTS IPSS score is 8/2, it is worsening Continue conservative management, avoiding bladder irritants and timed voiding's Most bothersome symptoms is/are seepage of urine and frequency - no longer finding Myrbetriq effective Continue finasteride 5 mg daily - refills given Still having significant urinary symptoms despite Proscar and Myrbetriq - will schedule cystoscopy for further evaluation of symptoms  I have explained to the patient that they will  be scheduled for a cystoscopy in our office to evaluate their bladder.  The cystoscopy consists of passing a tube with a lens up through their urethra and into their urinary bladder.   We will inject the urethra with a lidocaine gel prior to introducing the cystoscope to help with any discomfort during the procedure.   After the procedure, they might experience blood in the urine and discomfort with urination.  This will abate after the first few voids.  I have  encouraged the patient to increase water intake  during this time.  Patient denies any allergies to lidocaine.   2. Urge incontinence  - See Above   No follow-ups on file.  Keyaira Clapham, PA-C  [place], [place] [address] [phone]

## 2020-01-28 ENCOUNTER — Encounter: Payer: Self-pay | Admitting: Urology

## 2020-01-28 ENCOUNTER — Ambulatory Visit: Payer: PPO | Admitting: Urology

## 2020-03-01 DIAGNOSIS — E349 Endocrine disorder, unspecified: Secondary | ICD-10-CM | POA: Diagnosis not present

## 2020-03-01 DIAGNOSIS — E7849 Other hyperlipidemia: Secondary | ICD-10-CM | POA: Diagnosis not present

## 2020-03-01 DIAGNOSIS — K219 Gastro-esophageal reflux disease without esophagitis: Secondary | ICD-10-CM | POA: Diagnosis not present

## 2020-03-01 DIAGNOSIS — R739 Hyperglycemia, unspecified: Secondary | ICD-10-CM | POA: Diagnosis not present

## 2020-03-02 NOTE — Progress Notes (Signed)
03/03/2020  10:59 AM   Brian Banks 09/10/1945 [id number]  Referring provider: Adin Hector, MD [facility] [place],  [place] [address]  Chief Complaint  Patient presents with  . Benign Prostatic Hypertrophy   Urological history 1. BPH with LU TS - I PSS 11/4 - PSA 0.5 02/2019 - cysto 03/2019 moderate lateral lobe enlargement prostate.   mild elevation bladder neck - managed with finasteride 5 mg daily   2. Urge incontinence - PVR 0 mL  HPI: Brian Banks is a 75 y.o. male who presents today for yearly follow up.  He is having issues with urgency and incontinence.    He continues to have issues with urgency and urge incontinence.  Myrbetriq is starting to wane in its effectiveness and also has become cost prohibitive.   Patient denies any modifying or aggravating factors.  Patient denies any gross hematuria, dysuria or suprapubic/flank pain.  Patient denies any fevers, chills, nausea or vomiting.     IPSS    Row Name 03/03/20 0800         International Prostate Symptom Score   How often have you had the sensation of not emptying your bladder? Not at All     How often have you had to urinate less than every two hours? More than half the time     How often have you found you stopped and started again several times when you urinated? Less than 1 in 5 times     How often have you found it difficult to postpone urination? More than half the time     How often have you had a weak urinary stream? Not at All     How often have you had to strain to start urination? Not at All     How many times did you typically get up at night to urinate? 2 Times     Total IPSS Score 11           Quality of Life due to urinary symptoms   If you were to spend the rest of your life with your urinary condition just the way it is now how would you feel about that? Mostly Disatisfied            Score:  1-7 Mild 8-19 Moderate 20-35  Severe   PMH: Past Medical History:  Diagnosis Date  . Benign neoplasm of large bowel   . BPH with obstruction/lower urinary tract symptoms   . Dysphagia   . Erectile dysfunction   . Heartburn   . History of kidney stones   . History of stomach ulcers   . HLD (hyperlipidemia)   . Hypogonadism in male   . Obesity   . Urinary frequency   . Urinary urgency   . Vitamin D deficiency     Surgical History: Past Surgical History:  Procedure Laterality Date  . APPENDECTOMY      Home Medications:  Allergies as of 03/03/2020      Reactions   Androgel [testosterone]    Lovastatin       Medication List       Accurate as of March 03, 2020 11:59 PM. If you have any questions, ask your nurse or doctor.        STOP taking these medications   mirabegron ER 25 MG Tb24 tablet Commonly known as: MYRBETRIQ Stopped by: Zara Council, PA-C     TAKE these medications   finasteride 5  MG tablet Commonly known as: PROSCAR Take 1 tablet (5 mg total) by mouth daily.   Glucosamine-Chondroitin 750-600 MG Tabs Take by mouth.   omeprazole 40 MG capsule Commonly known as: PRILOSEC Take by mouth.       Allergies:  Allergies  Allergen Reactions  . Androgel [Testosterone]   . Lovastatin     Family History: Family History  Problem Relation Age of Onset  . Alzheimer's disease Father   . Prostate cancer Brother   . Diabetes Mellitus II Mother   . Stroke Mother     Social History:  reports that he has been smoking cigars. He has never used smokeless tobacco. He reports current alcohol use. He reports that he does not use drugs.  For pertinent review of systems please refer to history of present illness  Physical Exam: BP 136/82   Pulse 78   Ht 6' 5"  (1.956 m)   Wt 283 lb (128.4 kg)   BMI 33.56 kg/m   Constitutional:  Well nourished. Alert and oriented, No acute distress. HEENT: [place] AT, mask in place.  Trachea midline Cardiovascular: No clubbing, cyanosis, or  edema. Respiratory: Normal respiratory effort, no increased work of breathing. GU: No CVA tenderness.  No bladder fullness or masses.  Patient with uncircumcised phallus.   Foreskin easily retracted  Urethral meatus is patent.  No penile discharge. No penile lesions or rashes. Scrotum without lesions, cysts, rashes and/or edema.  Testicles are located scrotally bilaterally. No masses are appreciated in the testicles. Left and right epididymis are normal. Rectal: Patient with  normal sphincter tone. Anus and perineum without scarring or rashes. No rectal masses are appreciated. Prostate is approximately 60 + grams, could only palpate the apex and the midportion of the gland, no nodules are appreciated. Seminal vesicles could not be palpated  Neurologic: Grossly intact, no focal deficits, moving all 4 extremities. Psychiatric: Normal mood and affect.  Laboratory Data: Specimen:  Blood  Ref Range & Units 1 d ago  Glucose 70 - 110 mg/dL 118High   Sodium 136 - 145 mmol/L 143   Potassium 3.6 - 5.1 mmol/L 4.1   Chloride 97 - 109 mmol/L 106   Carbon Dioxide (CO2) 22.0 - 32.0 mmol/L 26.8   Urea Nitrogen (BUN) 7 - 25 mg/dL 22   Creatinine 0.7 - 1.3 mg/dL 1.0   Glomerular Filtration Rate (eGFR), MDRD Estimate >60 mL/min/1.73sq m 73   Calcium 8.7 - 10.3 mg/dL 9.1   AST  8 - 39 U/L 17   ALT  6 - 57 U/L 25   Alk Phos (alkaline Phosphatase) 34 - 104 U/L 48   Albumin 3.5 - 4.8 g/dL 4.3   Bilirubin, Total 0.3 - 1.2 mg/dL 0.7   Protein, Total 6.1 - 7.9 g/dL 6.5   A/G Ratio 1.0 - 5.0 gm/dL 2.0   Resulting Agency  [facility]  Specimen Collected: 03/01/20 7:39 AM Last Resulted: 03/01/20 3:10 PM  Received From: [place]  Result Received: 03/02/20 8:27 AM   Specimen:  Blood  Ref Range & Units 1 d ago  WBC (White Blood Cell Count) 4.1 - 10.2 10^3/uL 5.9   RBC (Red Blood Cell Count) 4.69 - 6.13 10^6/uL 4.80   Hemoglobin 14.1 - 18.1 gm/dL 14.7   Hematocrit 40.0 -  52.0 % 43.1   MCV (Mean Corpuscular Volume) 80.0 - 100.0 fl 89.8   MCH (Mean Corpuscular Hemoglobin) 27.0 - 31.2 pg 30.6   MCHC (Mean Corpuscular Hemoglobin Concentration)  32.0 - 36.0 gm/dL 34.1   Platelet Count 150 - 450 10^3/uL 231   RDW-CV (Red Cell Distribution Width) 11.6 - 14.8 % 12.0   MPV (Mean Platelet Volume) 9.4 - 12.4 fl 9.9   Neutrophils 1.50 - 7.80 10^3/uL 3.73   Lymphocytes 1.00 - 3.60 10^3/uL 1.63   Monocytes 0.00 - 1.50 10^3/uL 0.37   Eosinophils 0.00 - 0.55 10^3/uL 0.16   Basophils 0.00 - 0.09 10^3/uL 0.02   Neutrophil % 32.0 - 70.0 % 63.0   Lymphocyte % 10.0 - 50.0 % 27.5   Monocyte % 4.0 - 13.0 % 6.2   Eosinophil % 1.0 - 5.0 % 2.7   Basophil% 0.0 - 2.0 % 0.3   Immature Granulocyte % <=0.7 % 0.3   Immature Granulocyte Count <=0.06 10^3/L 0.02   Resulting Agency  [place] - [facility]  Specimen Collected: 03/01/20 7:39 AM Last Resulted: 03/01/20 8:13 AM  Received From: [place]  Result Received: 03/02/20 8:27 AM   Specimen:  Blood  Ref Range & Units 1 d ago  Hemoglobin A1C 4.2 - 5.6 % 6.2High   Average Blood Glucose (Calc) mg/dL 131   Resulting Agency  [place] - LAB   Narrative  Normal Range:  4.2 - 5.6%  Increased Risk: 5.7 - 6.4%  Diabetes:    >= 6.5%  Glycemic Control for adults with diabetes: <7%  Specimen Collected: 03/01/20 7:39 AM Last Resulted: 03/01/20 10:06 AM  Received From: [place]  Result Received: 03/02/20 8:27 AM   Specimen:  Blood  Ref Range & Units 1 d ago  Cholesterol, Total 100 - 200 mg/dL 191   Triglyceride 35 - 199 mg/dL 131   HDL (High Density Lipoprotein) Cholesterol 29.0 - 71.0 mg/dL 43.8   LDL Calculated 0 - 130 mg/dL 121   VLDL Cholesterol mg/dL 26   Cholesterol/HDL Ratio  4.4   Resulting [place] - LAB  Specimen Collected: 03/01/20 7:39 AM Last Resulted: 03/01/20 3:10 PM  Received From: [place]  Result  Received: 03/02/20 8:27 AM   Specimen:  Blood  Ref Range & Units 1 d ago  Thyroid Stimulating Hormone (TSH) 0.450-5.330 uIU/ml uIU/mL 5.896High   Resulting Agency  [place] - LAB  Specimen Collected: 03/01/20 7:39 AM Last Resulted: 03/01/20 1:16 PM  Received From: [place]  Result Received: 03/02/20 8:27 AM   Specimen:  Urine  Ref Range & Units 1 d ago  Color Yellow, Violet, Light Violet, Dark Violet Yellow   Clarity Clear Clear   Specific Gravity 1.000 - 1.030 1.025   pH, Urine 5.0 - 8.0 6.0   Protein, Urinalysis Negative, Trace mg/dL Negative   Glucose, Urinalysis Negative mg/dL Negative   Ketones, Urinalysis Negative mg/dL Negative   Blood, Urinalysis Negative Negative   Nitrite, Urinalysis Negative Negative   Leukocyte Esterase, Urinalysis Negative Negative   White Blood Cells, Urinalysis None Seen, 0-3 /hpf 0-3   Red Blood Cells, Urinalysis None Seen, 0-3 /hpf None Seen   Bacteria, Urinalysis None Seen /hpf None Seen   Squamous Epithelial Cells, Urinalysis Rare, Few, None Seen /hpf None Seen   Resulting Agency  [facility] - LAB   Narrative  Rare amorphous Specimen Collected: 03/01/20 7:39 AM Last Resulted: 03/01/20 8:16 AM  Received From: [place]  Result Received: 03/02/20 8:27 AM     PSA history  1.2 ng/mL on 05/26/2013  1.0 ng/mL  on 12/10/2013   Component     Latest Ref Rng & Units 12/16/2014 12/26/2015 12/20/2016 12/31/2017  Prostate Specific Ag, Serum     0.0 - 4.0 ng/mL 1.0 1.0 0.8 0.7   Component     Latest Ref Rng & Units 02/22/2019  Prostate Specific Ag, Serum     0.0 - 4.0 ng/mL 0.5  I have reviewed the labs.   Assessment & Plan:    1. BPH with LUTS IPSS score is 11/4, it is worsening Continue conservative management, avoiding bladder irritants and timed voiding's Most bothersome symptoms is/are urge incontinence Continue finasteride 5 mg daily - refills given  2. Urge  incontinence Patient not a candidate for anticholinergic therapy due to age and already having dry eyes Myrbetriq has not been as effective and is becoming cost prohibitive explained the PTNS provides treatment by indirectly providing electrical stimulation to the nerves responsible for bladder and pelvic floor function - a needle electrode generates an adjustable electrical pulse that travels to the sacral plexus via the tibial nerve which is located in the ankle, among other functions, the sacral nerve plexus regulates bladder and pelvic floor function - treatment protocol requires once-a-week treatments for 12 weeks, 30 minutes per session and many patients begin to see improvements by the 6th treatment. Patients who respond to treatment may require occasional treatments (~ once every 3 weeks) to sustain improvements. PTNS is a low-risk procedure. The most common side-effects with PTNS treatment are temporary and minor, resulting from the placement of the needle electrode. They include minor bleeding, mild pain and skin inflammation and patients have seen up to an 80% success rate with this form of treatment  Return for Return pending prior authorization for PTNS.  Zara Council, PA-C [facility] [address] [place], [place] [address] [phone]

## 2020-03-03 ENCOUNTER — Telehealth: Payer: Self-pay | Admitting: Urology

## 2020-03-03 ENCOUNTER — Encounter: Payer: Self-pay | Admitting: Urology

## 2020-03-03 ENCOUNTER — Ambulatory Visit: Payer: PPO | Admitting: Urology

## 2020-03-03 ENCOUNTER — Telehealth: Payer: Self-pay | Admitting: *Deleted

## 2020-03-03 ENCOUNTER — Other Ambulatory Visit: Payer: Self-pay

## 2020-03-03 VITALS — BP 136/82 | HR 78 | Ht 77.0 in | Wt 283.0 lb

## 2020-03-03 DIAGNOSIS — N401 Enlarged prostate with lower urinary tract symptoms: Secondary | ICD-10-CM | POA: Diagnosis not present

## 2020-03-03 DIAGNOSIS — N3941 Urge incontinence: Secondary | ICD-10-CM

## 2020-03-03 DIAGNOSIS — N138 Other obstructive and reflux uropathy: Secondary | ICD-10-CM

## 2020-03-03 MED ORDER — FINASTERIDE 5 MG PO TABS: 5.0000 mg | ORAL_TABLET | Freq: Every day | ORAL | 3 refills | Status: DC

## 2020-03-03 NOTE — Telephone Encounter (Signed)
Please check Mr. Vecchio insurance for PTNS.  He would like to see if it would work for his urge incontinence.

## 2020-03-03 NOTE — Telephone Encounter (Signed)
No prior auth needed. Can schedule ptns when he is ready

## 2020-03-04 LAB — PSA: Prostate Specific Ag, Serum: 0.7 ng/mL (ref 0.0–4.0)

## 2020-03-07 DIAGNOSIS — R7989 Other specified abnormal findings of blood chemistry: Secondary | ICD-10-CM | POA: Diagnosis not present

## 2020-03-07 DIAGNOSIS — E039 Hypothyroidism, unspecified: Secondary | ICD-10-CM | POA: Diagnosis not present

## 2020-03-07 DIAGNOSIS — K760 Fatty (change of) liver, not elsewhere classified: Secondary | ICD-10-CM | POA: Diagnosis not present

## 2020-03-07 DIAGNOSIS — K219 Gastro-esophageal reflux disease without esophagitis: Secondary | ICD-10-CM | POA: Diagnosis not present

## 2020-03-07 DIAGNOSIS — R739 Hyperglycemia, unspecified: Secondary | ICD-10-CM | POA: Diagnosis not present

## 2020-03-07 DIAGNOSIS — Z0000 Encounter for general adult medical examination without abnormal findings: Secondary | ICD-10-CM | POA: Diagnosis not present

## 2020-03-07 DIAGNOSIS — E7849 Other hyperlipidemia: Secondary | ICD-10-CM | POA: Diagnosis not present

## 2020-03-07 DIAGNOSIS — E349 Endocrine disorder, unspecified: Secondary | ICD-10-CM | POA: Diagnosis not present

## 2020-03-19 ENCOUNTER — Emergency Department: Payer: PPO

## 2020-03-19 ENCOUNTER — Other Ambulatory Visit: Payer: Self-pay

## 2020-03-19 ENCOUNTER — Emergency Department
Admission: EM | Admit: 2020-03-19 | Discharge: 2020-03-19 | Disposition: A | Discharge status: Home / Self Care | Payer: PPO | Attending: Emergency Medicine | Admitting: Emergency Medicine

## 2020-03-19 ENCOUNTER — Encounter: Payer: Self-pay | Admitting: Emergency Medicine

## 2020-03-19 DIAGNOSIS — F17290 Nicotine dependence, other tobacco product, uncomplicated: Secondary | ICD-10-CM | POA: Diagnosis not present

## 2020-03-19 DIAGNOSIS — S61011A Laceration without foreign body of right thumb without damage to nail, initial encounter: Secondary | ICD-10-CM | POA: Diagnosis not present

## 2020-03-19 DIAGNOSIS — Z23 Encounter for immunization: Secondary | ICD-10-CM | POA: Insufficient documentation

## 2020-03-19 DIAGNOSIS — S60931A Unspecified superficial injury of right thumb, initial encounter: Secondary | ICD-10-CM | POA: Diagnosis present

## 2020-03-19 DIAGNOSIS — Z85038 Personal history of other malignant neoplasm of large intestine: Secondary | ICD-10-CM | POA: Diagnosis not present

## 2020-03-19 DIAGNOSIS — W268XXA Contact with other sharp object(s), not elsewhere classified, initial encounter: Secondary | ICD-10-CM | POA: Insufficient documentation

## 2020-03-19 DIAGNOSIS — R600 Localized edema: Secondary | ICD-10-CM | POA: Diagnosis not present

## 2020-03-19 MED ORDER — CEPHALEXIN 500 MG PO CAPS: 500.0000 mg | ORAL_CAPSULE | Freq: Three times a day (TID) | ORAL | 0 refills | Status: AC

## 2020-03-19 MED ORDER — LIDOCAINE HCL 1 % IJ SOLN
5.0000 mL | Freq: Once | INTRAMUSCULAR | Status: AC
  Administered 2020-03-19: 5 mL
  Filled 2020-03-19: qty 10

## 2020-03-19 MED ORDER — TETANUS-DIPHTH-ACELL PERTUSSIS 5-2.5-18.5 LF-MCG/0.5 IM SUSY
0.5000 mL | PREFILLED_SYRINGE | Freq: Once | INTRAMUSCULAR | Status: AC
  Administered 2020-03-19: 0.5 mL via INTRAMUSCULAR
  Filled 2020-03-19: qty 0.5

## 2020-03-19 NOTE — ED Notes (Signed)
Pt had accident with tractor today around 1300. Pt denies pain but has laceration with subcutaneous tissue showing on R thumb, about 2cm from fingernail on anterior side. EDP provider has examined pt.

## 2020-03-19 NOTE — ED Provider Notes (Signed)
[facility]-EMERGENCY DEPARTMENT  ____________________________________________  Time seen: Approximately 3:49 PM  I have reviewed the triage vital signs and the nursing notes.   HISTORY  Chief Complaint Laceration   Historian Patient    HPI Brian Banks is a 75 y.o. male presents to the emergency department with a laceration sustained to the right thumb.  Laceration was sustained while working on a tractor.  Patient states he had some numbness in his next finger while riding to the emergency department but no numbness in the thumb.  He states after he was actively moved hand the numbness went away.  Patient is unsure of last tetanus shot.  No other alleviating measures have been attempted.   Past Medical History:  Diagnosis Date  . Benign neoplasm of large bowel   . BPH with obstruction/lower urinary tract symptoms   . Dysphagia   . Erectile dysfunction   . Heartburn   . History of kidney stones   . History of stomach ulcers   . HLD (hyperlipidemia)   . Hypogonadism in male   . Obesity   . Urinary frequency   . Urinary urgency   . Vitamin D deficiency      Immunizations up to date:  Yes.     Past Medical History:  Diagnosis Date  . Benign neoplasm of large bowel   . BPH with obstruction/lower urinary tract symptoms   . Dysphagia   . Erectile dysfunction   . Heartburn   . History of kidney stones   . History of stomach ulcers   . HLD (hyperlipidemia)   . Hypogonadism in male   . Obesity   . Urinary frequency   . Urinary urgency   . Vitamin D deficiency     Patient Active Problem List   Diagnosis Date Noted  . Hepatic steatosis 02/27/2018  . Morbidly obese ([place]) 02/06/2018  . GERD (gastroesophageal reflux disease) 04/15/2017  . Elevated TSH 01/30/2016  . Hyperglycemia, unspecified 01/30/2016  . Hyperlipidemia, unspecified 12/19/2014  . BPH with obstruction/lower urinary tract symptoms 12/19/2014  . Benign fibroma of prostate 07/12/2013  . Acid  indigestion 07/12/2013  . Hypotestosteronism 07/12/2013    Past Surgical History:  Procedure Laterality Date  . APPENDECTOMY      Prior to Admission medications   Medication Sig Start Date End Date Taking? Authorizing Provider  cephALEXin (KEFLEX) 500 MG capsule Take 1 capsule (500 mg total) by mouth 3 (three) times daily for 7 days. 03/19/20 03/26/20 Yes Vallarie Mare M, PA-C  finasteride (PROSCAR) 5 MG tablet Take 1 tablet (5 mg total) by mouth daily. 03/03/20   Zara Council A, PA-C  Glucosamine-Chondroitin 750-600 MG TABS Take by mouth.    [provider]  omeprazole (PRILOSEC) 40 MG capsule Take by mouth. 09/21/14   [provider]    Allergies Androgel [testosterone] and Lovastatin  Family History  Problem Relation Age of Onset  . Alzheimer's disease Father   . Prostate cancer Brother   . Diabetes Mellitus II Mother   . Stroke Mother     Social History Social History   Tobacco Use  . Smoking status: Current Some Day Smoker    Types: Cigars  . Smokeless tobacco: Never Used  Vaping Use  . Vaping Use: Never used  Substance Use Topics  . Alcohol use: Yes    Alcohol/week: 0.0 standard drinks    Comment: Rare  . Drug use: No     Review of Systems  Constitutional: No fever/chills Eyes:  No  discharge ENT: No upper respiratory complaints. Respiratory: no cough. No SOB/ use of accessory muscles to breath Gastrointestinal:   No nausea, no vomiting.  No diarrhea.  No constipation. Musculoskeletal: Patient has right thumb laceration and pain.  Skin: Negative for rash, abrasions, lacerations, ecchymosis.    ____________________________________________   PHYSICAL EXAM:  VITAL SIGNS: ED Triage Vitals  Enc Vitals Group     BP 03/19/20 1442 (!) 147/79     Pulse Rate 03/19/20 1442 71     Resp 03/19/20 1442 16     Temp 03/19/20 1442 98 F (36.7 C)     Temp Source 03/19/20 1442 Oral     SpO2 03/19/20 1442 99 %     Weight 03/19/20 1440 285 lb  (129.3 kg)     Height 03/19/20 1440 6\' 5"  (1.956 m)     Head Circumference --      Peak Flow --      Pain Score 03/19/20 1440 0     Pain Loc --      Pain Edu? --      Excl. in [place]? --      Constitutional: Alert and oriented. Well appearing and in no acute distress. Eyes: Conjunctivae are normal. PERRL. EOMI. Head: Atraumatic. Cardiovascular: Normal rate, regular rhythm. Normal S1 and S2.  Good peripheral circulation. Respiratory: Normal respiratory effort without tachypnea or retractions. Lungs CTAB. Good air entry to the bases with no decreased or absent breath sounds Gastrointestinal: Bowel sounds x 4 quadrants. Soft and nontender to palpation. No guarding or rigidity. No distention. Musculoskeletal: Patient can perform full range of motion of the right thumb.  Neurologic:  Normal for age. No gross focal neurologic deficits are appreciated.  Skin: Patient has a laceration along the volar aspect of the right thumb.  Patient also has linear lacerations along the lateral aspect of the right thumb extending the skin that should approximate underneath fingernail.  Palpable radial ulnar pulses bilaterally and symmetrically. Psychiatric: Mood and affect are normal for age. Speech and behavior are normal.   ____________________________________________   LABS (all labs ordered are listed, but only abnormal results are displayed)  Labs Reviewed - No data to display ____________________________________________  EKG   ____________________________________________  RADIOLOGY Unk Pinto, personally viewed and evaluated these images (plain radiographs) as part of my medical decision making, as well as reviewing the written report by the radiologist.    DG Finger Thumb Right  Result Date: 03/19/2020 CLINICAL DATA:  Right thumb laceration from accident with tractor EXAM: RIGHT THUMB 2+V COMPARISON:  None. FINDINGS: There is no evidence of fracture or dislocation. There is no evidence  of arthropathy or other focal bone abnormality. Cutaneous skin defect along the volar aspect of the distal first digit with subcutaneous edema throughout the digit and punctate foci of gas which extend into close approximation with the cortical surface of the distal phalange. No radiopaque foreign body. IMPRESSION: Cutaneous skin defect along the volar aspect of the distal first digit with subcutaneous edema throughout the digit and punctate foci of gas which extend into close approximation with the osseous surface. No visualized radiopaque foreign body or acute osseous injury. Electronically Signed   By: Dahlia Bailiff MD   On: 03/19/2020 16:09    ____________________________________________    PROCEDURES  Procedure(s) performed:     Marland KitchenMarland KitchenLaceration Repair  Date/Time: 03/19/2020 4:58 PM Performed by: Lannie Fields, PA-C Authorized by: Lannie Fields, PA-C   Consent:    Consent obtained:  Verbal   Consent given by:  Patient   Risks discussed:  Infection, pain, retained foreign body, poor cosmetic result and poor wound healing Universal protocol:    Procedure explained and questions answered to patient or proxy's satisfaction: yes     Patient identity confirmed:  Verbally with patient Anesthesia:    Anesthesia method:  Local infiltration   Local anesthetic:  Lidocaine 1% w/o epi Laceration details:    Location:  Finger   Finger location:  R thumb   Length (cm):  4   Depth (mm):  5 Exploration:    Limited defect created (wound extended): no     Hemostasis achieved with:  Direct pressure   Contaminated: no   Treatment:    Area cleansed with:  Saline   Amount of cleaning:  Extensive   Irrigation solution:  Sterile saline   Visualized foreign bodies/material removed: no     Debridement:  Minimal Skin repair:    Repair method:  Sutures   Suture size:  4-0   Number of sutures:  15 Approximation:    Approximation:  Close Repair type:    Repair type:  Simple Post-procedure  details:    Dressing:  Sterile dressing   Procedure completion:  Tolerated well, no immediate complications       Medications  lidocaine (XYLOCAINE) 1 % (with pres) injection 5 mL (5 mLs Infiltration Given 03/19/20 1552)  Tdap (BOOSTRIX) injection 0.5 mL (0.5 mLs Intramuscular Given 03/19/20 1643)     ____________________________________________   INITIAL IMPRESSION / ASSESSMENT AND PLAN / ED COURSE  Pertinent labs & imaging results that were available during my care of the patient were reviewed by me and considered in my medical decision making (see chart for details).      Assessment and Plan: Right thumb laceration:  75 year old male presents to the emergency department with a right thumb laceration.  Laceration was repaired without complication using sutures.  No bony abnormality was visualized on x-ray of the right thumb.  Tetanus status was updated in the emergency department and patient was discharged with Keflex.  He was advised to have sutures removed by primary care in 7 days.  All patient questions were answered.    ____________________________________________  FINAL CLINICAL IMPRESSION(S) / ED DIAGNOSES  Final diagnoses:  Laceration of right thumb without foreign body without damage to nail, initial encounter      NEW MEDICATIONS STARTED DURING THIS VISIT:  ED Discharge Orders         Ordered    cephALEXin (KEFLEX) 500 MG capsule  3 times daily        03/19/20 1653              This chart was dictated using voice recognition software/Dragon. Despite best efforts to proofread, errors can occur which can change the meaning. Any change was purely unintentional.     Lannie Fields, PA-C 03/19/20 1707    Nance Pear, MD 03/19/20 [phone]

## 2020-03-19 NOTE — Discharge Instructions (Signed)
Take Keflex three times daily for the next seven days.  Have sutures removed in seven days.

## 2020-03-19 NOTE — ED Triage Notes (Signed)
Pt to ED via POV, pt states that he has a laceration on his right thumb. Pt states that it happened about 1 hour PTA. Pt is in NAD.

## 2020-03-23 ENCOUNTER — Telehealth: Payer: Self-pay | Admitting: Urology

## 2020-03-23 NOTE — Telephone Encounter (Signed)
[name] insurance will cover PTNS.  Would you get him scheduled for this?

## 2020-03-27 NOTE — Telephone Encounter (Signed)
Patient scheduled.

## 2020-04-24 ENCOUNTER — Other Ambulatory Visit: Payer: Self-pay

## 2020-04-24 ENCOUNTER — Ambulatory Visit (INDEPENDENT_AMBULATORY_CARE_PROVIDER_SITE_OTHER): Payer: PPO | Admitting: Family Medicine

## 2020-04-24 DIAGNOSIS — N3941 Urge incontinence: Secondary | ICD-10-CM

## 2020-04-24 NOTE — Progress Notes (Signed)
PTNS  Session # 1  Health & Social Factors: No change Caffeine: 1 Alcohol: 0-1 Daytime voids #per day: 6-8 Night-time voids #per night: 2 Urgency: mild Incontinence Episodes #per day: 0 Ankle used: left Treatment Setting: 6 Feeling/ Response:sensory Comments: Patient tolerated well  Performed By: Elberta Leatherwood, CMA  Follow Up: 1 week #2

## 2020-04-24 NOTE — Patient Instructions (Signed)
Tracking Your Bladder Symptoms    Patient Name:___________________________________________________   Sample: Day   Daytime Voids  Nighttime Voids Urgency for the Day(0-4) Number of Accidents Beverage Comments  Monday IIII II 2 I Water IIII Coffee  I      Week Starting:____________________________________   Day Daytime  Voids Nighttime  Voids Urgency for  The Day(0-4) Number of Accidents Beverages Comments                                                           This week my symptoms were:  O much better  O better O the same O worse   

## 2020-04-26 DIAGNOSIS — D225 Melanocytic nevi of trunk: Secondary | ICD-10-CM | POA: Diagnosis not present

## 2020-04-26 DIAGNOSIS — L821 Other seborrheic keratosis: Secondary | ICD-10-CM | POA: Diagnosis not present

## 2020-04-26 DIAGNOSIS — X32XXXA Exposure to sunlight, initial encounter: Secondary | ICD-10-CM | POA: Diagnosis not present

## 2020-04-26 DIAGNOSIS — D2262 Melanocytic nevi of left upper limb, including shoulder: Secondary | ICD-10-CM | POA: Diagnosis not present

## 2020-04-26 DIAGNOSIS — D485 Neoplasm of uncertain behavior of skin: Secondary | ICD-10-CM | POA: Diagnosis not present

## 2020-04-26 DIAGNOSIS — L570 Actinic keratosis: Secondary | ICD-10-CM | POA: Diagnosis not present

## 2020-04-26 DIAGNOSIS — D2271 Melanocytic nevi of right lower limb, including hip: Secondary | ICD-10-CM | POA: Diagnosis not present

## 2020-04-26 DIAGNOSIS — D2261 Melanocytic nevi of right upper limb, including shoulder: Secondary | ICD-10-CM | POA: Diagnosis not present

## 2020-04-26 DIAGNOSIS — D2272 Melanocytic nevi of left lower limb, including hip: Secondary | ICD-10-CM | POA: Diagnosis not present

## 2020-05-01 ENCOUNTER — Ambulatory Visit (INDEPENDENT_AMBULATORY_CARE_PROVIDER_SITE_OTHER): Payer: PPO

## 2020-05-01 ENCOUNTER — Other Ambulatory Visit: Payer: Self-pay

## 2020-05-01 DIAGNOSIS — N3941 Urge incontinence: Secondary | ICD-10-CM

## 2020-05-01 NOTE — Patient Instructions (Signed)
Tracking Your Bladder Symptoms    Patient Name:___________________________________________________   Sample: Day   Daytime Voids  Nighttime Voids Urgency for the Day(0-4) Number of Accidents Beverage Comments  Monday IIII II 2 I Water IIII Coffee  I      Week Starting:____________________________________   Day Daytime  Voids Nighttime  Voids Urgency for  The Day(0-4) Number of Accidents Beverages Comments                                                           This week my symptoms were:  O much better  O better O the same O worse   

## 2020-05-01 NOTE — Progress Notes (Signed)
PTNS  Session #2   Health & Social Factors: Pt notes great improvement in his urinary symptoms. States he has been able to stop wearing incontinence pads for the first time in years. Caffeine: 2 Alcohol: 1 weekly  Daytime voids #per day: 7 Night-time voids #per night: 2 Urgency: Mild Incontinence Episodes #per day: 0 Ankle used: Left  Treatment Setting: 3 Feeling/ Response: Sensory & Toe Flex  Comments: N/A  Performed By: Gordy Clement, CMA  Follow Up: RTC in 1 week for PTNS

## 2020-05-08 ENCOUNTER — Other Ambulatory Visit: Payer: Self-pay

## 2020-05-08 ENCOUNTER — Ambulatory Visit: Payer: PPO

## 2020-05-08 DIAGNOSIS — N3941 Urge incontinence: Secondary | ICD-10-CM

## 2020-05-08 NOTE — Progress Notes (Signed)
Session #3  Health & Social Factors: no change Caffeine: 2-3 Alcohol: 1-2 weekly  Daytime voids #per day: 8-9 Night-time voids #per night: 2 Urgency: Mild Incontinence Episodes #per day: 0 Ankle used: Left  Treatment Setting: 8 Feeling/ Response: Sensory & Toe Flex  Comments: N/A  Performed By: Kerman Passey, RMA  Follow Up: RTC in 1 week for PTNS

## 2020-05-08 NOTE — Patient Instructions (Signed)
Tracking Your Bladder Symptoms    Patient Name:___________________________________________________   Sample: Day   Daytime Voids  Nighttime Voids Urgency for the Day(0-4) Number of Accidents Beverage Comments  Monday IIII II 2 I Water IIII Coffee  I      Week Starting:____________________________________   Day Daytime  Voids Nighttime  Voids Urgency for  The Day(0-4) Number of Accidents Beverages Comments                                                           This week my symptoms were:  O much better  O better O the same O worse   

## 2020-05-15 ENCOUNTER — Ambulatory Visit (INDEPENDENT_AMBULATORY_CARE_PROVIDER_SITE_OTHER): Payer: PPO

## 2020-05-15 ENCOUNTER — Other Ambulatory Visit: Payer: Self-pay

## 2020-05-15 DIAGNOSIS — N3941 Urge incontinence: Secondary | ICD-10-CM | POA: Diagnosis not present

## 2020-05-15 NOTE — Patient Instructions (Signed)
Tracking Your Bladder Symptoms    Patient Name:___________________________________________________   Sample: Day   Daytime Voids  Nighttime Voids Urgency for the Day(0-4) Number of Accidents Beverage Comments  Monday IIII II 2 I Water IIII Coffee  I      Week Starting:____________________________________   Day Daytime  Voids Nighttime  Voids Urgency for  The Day(0-4) Number of Accidents Beverages Comments                                                           This week my symptoms were:  O much better  O better O the same O worse   

## 2020-05-15 NOTE — Progress Notes (Signed)
PTNS  Session # 4  Health & Social Factors: No Change  Caffeine: 2-3 Alcohol: 1-2 Weekly  Daytime voids #per day: 8-9 Night-time voids #per night: 1 Urgency: Mild Incontinence Episodes #per day: 0 Ankle used: Left (per pt request) Treatment Setting: 10 Feeling/ Response: Sensory Comments: Pt notes improvement in urinary urgency  Performed By: Gordy Clement, CMA   Follow Up: RTC in 1 week

## 2020-05-22 ENCOUNTER — Ambulatory Visit (INDEPENDENT_AMBULATORY_CARE_PROVIDER_SITE_OTHER): Payer: PPO

## 2020-05-22 ENCOUNTER — Other Ambulatory Visit: Payer: Self-pay

## 2020-05-22 DIAGNOSIS — N3941 Urge incontinence: Secondary | ICD-10-CM

## 2020-05-22 NOTE — Progress Notes (Signed)
PTNS  Session # 5  Health & Social Factors:No change Caffeine: 3-4 Alcohol: 2 weekly Daytime voids #per day: 8-9 Night-time voids #per night: 1 Urgency: mild Incontinence Episodes #per day: 0 Ankle used: Left (per patient request) Treatment Setting: 12 Feeling/ Response: Sensory Comments: Pt tolerated well  Performed By: Lesli Albee    Follow Up: 1 week

## 2020-05-29 ENCOUNTER — Ambulatory Visit (INDEPENDENT_AMBULATORY_CARE_PROVIDER_SITE_OTHER): Payer: PPO

## 2020-05-29 ENCOUNTER — Other Ambulatory Visit: Payer: Self-pay

## 2020-05-29 DIAGNOSIS — N3941 Urge incontinence: Secondary | ICD-10-CM

## 2020-05-29 NOTE — Progress Notes (Signed)
PTNS  Session # 6  Health & Social Factors: no change Caffeine: 3 Alcohol: 2 weekly Daytime voids #per day: 8 Night-time voids #per night: 1 Urgency: mild Incontinence Episodes #per day: 0-1 Ankle used: right Treatment Setting: 14 Feeling/ Response: sensory Comments: patient tolerated well  Performed By: Fonnie Jarvis, CMA   Follow Up: 1 week

## 2020-05-29 NOTE — Patient Instructions (Signed)
Tracking Your Bladder Symptoms    Patient Name:___________________________________________________   Sample: Day   Daytime Voids  Nighttime Voids Urgency for the Day(0-4) Number of Accidents Beverage Comments  Monday IIII II 2 I Water IIII Coffee  I      Week Starting:____________________________________   Day Daytime  Voids Nighttime  Voids Urgency for  The Day(0-4) Number of Accidents Beverages Comments                                                           This week my symptoms were:  O much better  O better O the same O worse   

## 2020-06-05 ENCOUNTER — Ambulatory Visit (INDEPENDENT_AMBULATORY_CARE_PROVIDER_SITE_OTHER): Payer: PPO | Admitting: Family Medicine

## 2020-06-05 ENCOUNTER — Other Ambulatory Visit: Payer: Self-pay

## 2020-06-05 DIAGNOSIS — N3941 Urge incontinence: Secondary | ICD-10-CM

## 2020-06-05 NOTE — Patient Instructions (Signed)
Tracking Your Bladder Symptoms    Patient Name:___________________________________________________   Sample: Day   Daytime Voids  Nighttime Voids Urgency for the Day(0-4) Number of Accidents Beverage Comments  Monday IIII II 2 I Water IIII Coffee  I      Week Starting:____________________________________   Day Daytime  Voids Nighttime  Voids Urgency for  The Day(0-4) Number of Accidents Beverages Comments                                                           This week my symptoms were:  O much better  O better O the same O worse   

## 2020-06-05 NOTE — Progress Notes (Signed)
PTNS  Session # 7  Health & Social Factors: no change Caffeine: 3 Alcohol: 2 weekly Daytime voids #per day: 8 Night-time voids #per night: 2 Urgency: mild Incontinence Episodes #per day: 1 Ankle used: left Treatment Setting: 12 Feeling/ Response: Sensory Comments: Patient tolerated well  Performed By: Elberta Leatherwood, CMA  Follow Up: 1 week #8

## 2020-06-05 NOTE — Addendum Note (Signed)
Addended by: Kyra Manges on: 06/05/2020 10:06 AM   Modules accepted: Level of Service

## 2020-06-12 ENCOUNTER — Ambulatory Visit (INDEPENDENT_AMBULATORY_CARE_PROVIDER_SITE_OTHER): Payer: PPO

## 2020-06-12 ENCOUNTER — Other Ambulatory Visit: Payer: Self-pay

## 2020-06-12 DIAGNOSIS — N3941 Urge incontinence: Secondary | ICD-10-CM

## 2020-06-12 NOTE — Progress Notes (Signed)
PTNS  Session # 8  Health & Social Factors: No Change Caffeine: 3 Alcohol: 0 Daytime voids #per day: 8 Night-time voids #per night: 1 Urgency: Mild Incontinence Episodes #per day: 1 Ankle used: Left Treatment Setting: 2 Feeling/ Response: Sensory/Toe Flex  Comments: N/A   Performed By: Gordy Clement, CMA   Follow Up: RTC in 1 week

## 2020-06-19 ENCOUNTER — Ambulatory Visit (INDEPENDENT_AMBULATORY_CARE_PROVIDER_SITE_OTHER): Payer: PPO

## 2020-06-19 ENCOUNTER — Other Ambulatory Visit: Payer: Self-pay

## 2020-06-19 DIAGNOSIS — N3941 Urge incontinence: Secondary | ICD-10-CM

## 2020-06-19 NOTE — Progress Notes (Signed)
PTNS  Session # 9  Health & Social Factors: no change Caffeine: 3 Alcohol: 0 Daytime voids #per day: 8 Night-time voids #per night: 1 Urgency: mild Incontinence Episodes #per day: 0-1 Ankle used: left Treatment Setting: 19 Feeling/ Response: sensory Comments: patient tolerated well  Performed By: Fonnie Jarvis, CMA   Follow Up: 1 wk

## 2020-06-19 NOTE — Patient Instructions (Signed)
Tracking Your Bladder Symptoms    Patient Name:___________________________________________________   Sample: Day   Daytime Voids  Nighttime Voids Urgency for the Day(0-4) Number of Accidents Beverage Comments  Monday IIII II 2 I Water IIII Coffee  I      Week Starting:____________________________________   Day Daytime  Voids Nighttime  Voids Urgency for  The Day(0-4) Number of Accidents Beverages Comments                                                           This week my symptoms were:  O much better  O better O the same O worse   

## 2020-06-26 ENCOUNTER — Ambulatory Visit (INDEPENDENT_AMBULATORY_CARE_PROVIDER_SITE_OTHER): Payer: PPO

## 2020-06-26 ENCOUNTER — Other Ambulatory Visit: Payer: Self-pay

## 2020-06-26 DIAGNOSIS — N3941 Urge incontinence: Secondary | ICD-10-CM

## 2020-06-26 NOTE — Progress Notes (Signed)
PTNS  Session # 10  Health & Social Factors: Patient states he has reduced soft drinks and sweet tea intake significantly  Caffeine: 1 Alcohol: 0 Daytime voids #per day: 6 Night-time voids #per night: 1 Urgency: Mild Incontinence Episodes #per day: 0-1 Ankle used: Left  Treatment Setting: 2 Feeling/ Response: Sensory & Toe Flex  Comments: Patient did not bring voiding diary, but states that urinary symptoms are much better.   Performed By: Gordy Clement, CMA   Follow Up: RTC in 1 week for PTNS

## 2020-07-03 ENCOUNTER — Other Ambulatory Visit: Payer: Self-pay

## 2020-07-03 ENCOUNTER — Ambulatory Visit (INDEPENDENT_AMBULATORY_CARE_PROVIDER_SITE_OTHER): Payer: PPO | Admitting: Family Medicine

## 2020-07-03 DIAGNOSIS — N3941 Urge incontinence: Secondary | ICD-10-CM | POA: Diagnosis not present

## 2020-07-03 NOTE — Progress Notes (Signed)
PTNS  Session # 11  Health & Social Factors: no change Caffeine: 2 Alcohol: 0-4 (alcohol only on Friday and Saturday this week for a wedding) Daytime voids #per day: 8 Night-time voids #per night: 3 Urgency: mild Incontinence Episodes #per day: 0 Ankle used: left Treatment Setting: 13 Feeling/ Response: sensory Comments: Patient tolerated well  Performed By: Elberta Leatherwood, CMA  Follow Up: 1 week #12 of 45

## 2020-07-03 NOTE — Patient Instructions (Signed)
Tracking Your Bladder Symptoms    Patient Name:___________________________________________________   Sample: Day   Daytime Voids  Nighttime Voids Urgency for the Day(0-4) Number of Accidents Beverage Comments  Monday IIII II 2 I Water IIII Coffee  I      Week Starting:____________________________________   Day Daytime  Voids Nighttime  Voids Urgency for  The Day(0-4) Number of Accidents Beverages Comments                                                           This week my symptoms were:  O much better  O better O the same O worse   

## 2020-07-12 DIAGNOSIS — I219 Acute myocardial infarction, unspecified: Secondary | ICD-10-CM

## 2020-07-12 HISTORY — DX: Acute myocardial infarction, unspecified: I21.9

## 2020-07-12 HISTORY — PX: CARDIAC CATHETERIZATION: SHX172

## 2020-07-17 ENCOUNTER — Ambulatory Visit (INDEPENDENT_AMBULATORY_CARE_PROVIDER_SITE_OTHER): Payer: PPO

## 2020-07-17 ENCOUNTER — Other Ambulatory Visit: Payer: Self-pay

## 2020-07-17 DIAGNOSIS — N3941 Urge incontinence: Secondary | ICD-10-CM | POA: Diagnosis not present

## 2020-07-17 NOTE — Progress Notes (Signed)
PTNS  Session # 12  Health & Social Factors: No Change Caffeine: 2 Alcohol: 0 Daytime voids #per day: 8 Night-time voids #per night: 1 Urgency: Mild Incontinence Episodes #per day: 0 Ankle used: Left Treatment Setting: 10 Feeling/ Response: Sensory  Comments: Pt scheduled for 1 month f/u w/ provider   Performed By: Gordy Clement, CMA    Follow Up: RTC in 1 mo for provider follow up

## 2020-07-18 ENCOUNTER — Emergency Department: Payer: PPO

## 2020-07-18 ENCOUNTER — Inpatient Hospital Stay (HOSPITAL_COMMUNITY): Payer: PPO

## 2020-07-18 ENCOUNTER — Observation Stay
Admission: EM | Admit: 2020-07-18 | Discharge: 2020-07-18 | Disposition: A | Discharge status: Home / Self Care | Payer: PPO | Attending: Internal Medicine | Admitting: Internal Medicine

## 2020-07-18 ENCOUNTER — Inpatient Hospital Stay (HOSPITAL_COMMUNITY)
Admission: AD | Admit: 2020-07-18 | Discharge: 2020-07-24 | DRG: 236 | Disposition: A | Discharge status: Home / Self Care | Payer: PPO | Source: Other Acute Inpatient Hospital | Attending: Cardiothoracic Surgery | Admitting: Cardiothoracic Surgery

## 2020-07-18 ENCOUNTER — Encounter
Admission: EM | Disposition: A | Discharge status: Home / Self Care | Payer: Self-pay | Source: Home / Self Care | Attending: Emergency Medicine

## 2020-07-18 ENCOUNTER — Other Ambulatory Visit: Payer: Self-pay

## 2020-07-18 DIAGNOSIS — R079 Chest pain, unspecified: Secondary | ICD-10-CM | POA: Diagnosis present

## 2020-07-18 DIAGNOSIS — R778 Other specified abnormalities of plasma proteins: Secondary | ICD-10-CM | POA: Insufficient documentation

## 2020-07-18 DIAGNOSIS — E291 Testicular hypofunction: Secondary | ICD-10-CM | POA: Diagnosis present

## 2020-07-18 DIAGNOSIS — N138 Other obstructive and reflux uropathy: Secondary | ICD-10-CM | POA: Diagnosis present

## 2020-07-18 DIAGNOSIS — I252 Old myocardial infarction: Secondary | ICD-10-CM

## 2020-07-18 DIAGNOSIS — Z8711 Personal history of peptic ulcer disease: Secondary | ICD-10-CM

## 2020-07-18 DIAGNOSIS — I10 Essential (primary) hypertension: Secondary | ICD-10-CM | POA: Diagnosis not present

## 2020-07-18 DIAGNOSIS — I2510 Atherosclerotic heart disease of native coronary artery without angina pectoris: Secondary | ICD-10-CM

## 2020-07-18 DIAGNOSIS — D62 Acute posthemorrhagic anemia: Secondary | ICD-10-CM | POA: Diagnosis not present

## 2020-07-18 DIAGNOSIS — Z87442 Personal history of urinary calculi: Secondary | ICD-10-CM | POA: Diagnosis not present

## 2020-07-18 DIAGNOSIS — E559 Vitamin D deficiency, unspecified: Secondary | ICD-10-CM | POA: Diagnosis not present

## 2020-07-18 DIAGNOSIS — K219 Gastro-esophageal reflux disease without esophagitis: Secondary | ICD-10-CM | POA: Diagnosis present

## 2020-07-18 DIAGNOSIS — N401 Enlarged prostate with lower urinary tract symptoms: Secondary | ICD-10-CM | POA: Diagnosis present

## 2020-07-18 DIAGNOSIS — E8770 Fluid overload, unspecified: Secondary | ICD-10-CM | POA: Diagnosis not present

## 2020-07-18 DIAGNOSIS — Z951 Presence of aortocoronary bypass graft: Secondary | ICD-10-CM

## 2020-07-18 DIAGNOSIS — Z01810 Encounter for preprocedural cardiovascular examination: Secondary | ICD-10-CM

## 2020-07-18 DIAGNOSIS — R1310 Dysphagia, unspecified: Secondary | ICD-10-CM | POA: Diagnosis not present

## 2020-07-18 DIAGNOSIS — E785 Hyperlipidemia, unspecified: Secondary | ICD-10-CM | POA: Diagnosis present

## 2020-07-18 DIAGNOSIS — I214 Non-ST elevation (NSTEMI) myocardial infarction: Secondary | ICD-10-CM | POA: Diagnosis present

## 2020-07-18 DIAGNOSIS — M47812 Spondylosis without myelopathy or radiculopathy, cervical region: Secondary | ICD-10-CM | POA: Diagnosis not present

## 2020-07-18 DIAGNOSIS — Z20822 Contact with and (suspected) exposure to covid-19: Secondary | ICD-10-CM | POA: Diagnosis not present

## 2020-07-18 DIAGNOSIS — Z720 Tobacco use: Secondary | ICD-10-CM

## 2020-07-18 DIAGNOSIS — J90 Pleural effusion, not elsewhere classified: Secondary | ICD-10-CM | POA: Diagnosis not present

## 2020-07-18 DIAGNOSIS — Z79899 Other long term (current) drug therapy: Secondary | ICD-10-CM

## 2020-07-18 DIAGNOSIS — F17290 Nicotine dependence, other tobacco product, uncomplicated: Secondary | ICD-10-CM | POA: Insufficient documentation

## 2020-07-18 DIAGNOSIS — Z9889 Other specified postprocedural states: Secondary | ICD-10-CM

## 2020-07-18 DIAGNOSIS — J449 Chronic obstructive pulmonary disease, unspecified: Secondary | ICD-10-CM | POA: Diagnosis not present

## 2020-07-18 DIAGNOSIS — R0602 Shortness of breath: Secondary | ICD-10-CM | POA: Diagnosis not present

## 2020-07-18 DIAGNOSIS — Z8042 Family history of malignant neoplasm of prostate: Secondary | ICD-10-CM | POA: Diagnosis not present

## 2020-07-18 DIAGNOSIS — I712 Thoracic aortic aneurysm, without rupture: Secondary | ICD-10-CM | POA: Diagnosis not present

## 2020-07-18 DIAGNOSIS — Z820 Family history of epilepsy and other diseases of the nervous system: Secondary | ICD-10-CM | POA: Diagnosis not present

## 2020-07-18 DIAGNOSIS — Z888 Allergy status to other drugs, medicaments and biological substances status: Secondary | ICD-10-CM | POA: Diagnosis not present

## 2020-07-18 DIAGNOSIS — Z823 Family history of stroke: Secondary | ICD-10-CM

## 2020-07-18 DIAGNOSIS — I25110 Atherosclerotic heart disease of native coronary artery with unstable angina pectoris: Principal | ICD-10-CM | POA: Insufficient documentation

## 2020-07-18 DIAGNOSIS — J939 Pneumothorax, unspecified: Secondary | ICD-10-CM

## 2020-07-18 DIAGNOSIS — I200 Unstable angina: Secondary | ICD-10-CM

## 2020-07-18 DIAGNOSIS — Z833 Family history of diabetes mellitus: Secondary | ICD-10-CM

## 2020-07-18 DIAGNOSIS — M47814 Spondylosis without myelopathy or radiculopathy, thoracic region: Secondary | ICD-10-CM | POA: Diagnosis not present

## 2020-07-18 DIAGNOSIS — J9811 Atelectasis: Secondary | ICD-10-CM | POA: Diagnosis not present

## 2020-07-18 DIAGNOSIS — Z01818 Encounter for other preprocedural examination: Secondary | ICD-10-CM | POA: Diagnosis not present

## 2020-07-18 DIAGNOSIS — N3941 Urge incontinence: Secondary | ICD-10-CM

## 2020-07-18 DIAGNOSIS — R0789 Other chest pain: Secondary | ICD-10-CM | POA: Diagnosis not present

## 2020-07-18 DIAGNOSIS — I517 Cardiomegaly: Secondary | ICD-10-CM | POA: Diagnosis not present

## 2020-07-18 DIAGNOSIS — I4510 Unspecified right bundle-branch block: Secondary | ICD-10-CM | POA: Diagnosis not present

## 2020-07-18 DIAGNOSIS — I081 Rheumatic disorders of both mitral and tricuspid valves: Secondary | ICD-10-CM | POA: Diagnosis not present

## 2020-07-18 HISTORY — DX: Atherosclerotic heart disease of native coronary artery without angina pectoris: I25.10

## 2020-07-18 HISTORY — DX: Non-ST elevation (NSTEMI) myocardial infarction: I21.4

## 2020-07-18 HISTORY — PX: LEFT HEART CATH AND CORONARY ANGIOGRAPHY: CATH118249

## 2020-07-18 LAB — CBC WITH DIFFERENTIAL/PLATELET
Abs Immature Granulocytes: 0.03 10*3/uL (ref 0.00–0.07)
Basophils Absolute: 0 10*3/uL (ref 0.0–0.1)
Basophils Relative: 0 %
Eosinophils Absolute: 0.1 10*3/uL (ref 0.0–0.5)
Eosinophils Relative: 2 %
HCT: 39.8 % (ref 39.0–52.0)
Hemoglobin: 13.8 g/dL (ref 13.0–17.0)
Immature Granulocytes: 1 %
Lymphocytes Relative: 19 %
Lymphs Abs: 1.1 10*3/uL (ref 0.7–4.0)
MCH: 30.9 pg (ref 26.0–34.0)
MCHC: 34.7 g/dL (ref 30.0–36.0)
MCV: 89.2 fL (ref 80.0–100.0)
Monocytes Absolute: 0.3 10*3/uL (ref 0.1–1.0)
Monocytes Relative: 5 %
Neutro Abs: 4.4 10*3/uL (ref 1.7–7.7)
Neutrophils Relative %: 73 %
Platelets: 200 10*3/uL (ref 150–400)
RBC: 4.46 MIL/uL (ref 4.22–5.81)
RDW: 12.7 % (ref 11.5–15.5)
WBC: 6 10*3/uL (ref 4.0–10.5)
nRBC: 0 % (ref 0.0–0.2)

## 2020-07-18 LAB — URINALYSIS, ROUTINE W REFLEX MICROSCOPIC
Bilirubin Urine: NEGATIVE
Glucose, UA: NEGATIVE mg/dL
Hgb urine dipstick: NEGATIVE
Ketones, ur: NEGATIVE mg/dL
Leukocytes,Ua: NEGATIVE
Nitrite: NEGATIVE
Protein, ur: NEGATIVE mg/dL
Specific Gravity, Urine: 1.024 (ref 1.005–1.030)
pH: 6 (ref 5.0–8.0)

## 2020-07-18 LAB — BLOOD GAS, ARTERIAL
Acid-base deficit: 0.2 mmol/L (ref 0.0–2.0)
Bicarbonate: 23.4 mmol/L (ref 20.0–28.0)
FIO2: 21
O2 Saturation: 96.2 %
Patient temperature: 36.7
pCO2 arterial: 34 mmHg (ref 32.0–48.0)
pH, Arterial: 7.45 (ref 7.350–7.450)
pO2, Arterial: 76.8 mmHg — ABNORMAL LOW (ref 83.0–108.0)

## 2020-07-18 LAB — COMPREHENSIVE METABOLIC PANEL
ALT: 20 U/L (ref 0–44)
AST: 20 U/L (ref 15–41)
Albumin: 4 g/dL (ref 3.5–5.0)
Alkaline Phosphatase: 45 U/L (ref 38–126)
Anion gap: 10 (ref 5–15)
BUN: 18 mg/dL (ref 8–23)
CO2: 23 mmol/L (ref 22–32)
Calcium: 8.6 mg/dL — ABNORMAL LOW (ref 8.9–10.3)
Chloride: 106 mmol/L (ref 98–111)
Creatinine, Ser: 0.91 mg/dL (ref 0.61–1.24)
GFR, Estimated: >60 mL/min (ref >60)
Glucose, Bld: 171 mg/dL — ABNORMAL HIGH (ref 70–99)
Potassium: 4 mmol/L (ref 3.5–5.1)
Sodium: 139 mmol/L (ref 135–145)
Total Bilirubin: 0.7 mg/dL (ref 0.3–1.2)
Total Protein: 6.6 g/dL (ref 6.5–8.1)

## 2020-07-18 LAB — PROTIME-INR
INR: 1 (ref 0.8–1.2)
Prothrombin Time: 12.7 seconds (ref 11.4–15.2)

## 2020-07-18 LAB — RESP PANEL BY RT-PCR (FLU A&B, COVID) ARPGX2
Influenza A by PCR: NEGATIVE
Influenza B by PCR: NEGATIVE
SARS Coronavirus 2 by RT PCR: NEGATIVE

## 2020-07-18 LAB — ABO/RH: ABO/RH(D): A POS

## 2020-07-18 LAB — LIPID PANEL
Cholesterol: 179 mg/dL (ref 0–200)
HDL: 43 mg/dL (ref >40)
LDL Cholesterol: 117 mg/dL — ABNORMAL HIGH (ref 0–99)
Total CHOL/HDL Ratio: 4.2 RATIO
Triglycerides: 95 mg/dL (ref <150)
VLDL: 19 mg/dL (ref 0–40)

## 2020-07-18 LAB — TROPONIN I (HIGH SENSITIVITY)
Troponin I (High Sensitivity): 20 ng/L — ABNORMAL HIGH (ref <18)
Troponin I (High Sensitivity): 70 ng/L — ABNORMAL HIGH (ref <18)

## 2020-07-18 LAB — HEPARIN LEVEL (UNFRACTIONATED): Heparin Unfractionated: <0.1 IU/mL — ABNORMAL LOW (ref 0.30–0.70)

## 2020-07-18 LAB — APTT: aPTT: 31 seconds (ref 24–36)

## 2020-07-18 SURGERY — LEFT HEART CATH AND CORONARY ANGIOGRAPHY
Anesthesia: Moderate Sedation

## 2020-07-18 MED ORDER — HEPARIN (PORCINE) 25000 UT/250ML-% IV SOLN: 1500.0000 [IU]/h | INTRAVENOUS | Status: DC

## 2020-07-18 MED ORDER — MIDAZOLAM HCL 2 MG/2ML IJ SOLN
INTRAMUSCULAR | Status: AC
  Filled 2020-07-18: qty 2

## 2020-07-18 MED ORDER — PLASMA-LYTE A IV SOLN
INTRAVENOUS | Status: DC
  Filled 2020-07-18: qty 5

## 2020-07-18 MED ORDER — CARVEDILOL 3.125 MG PO TABS: 3.1250 mg | ORAL_TABLET | Freq: Two times a day (BID) | ORAL | Status: DC

## 2020-07-18 MED ORDER — HEPARIN (PORCINE) IN NACL 1000-0.9 UT/500ML-% IV SOLN
INTRAVENOUS | Status: AC
  Filled 2020-07-18: qty 1000

## 2020-07-18 MED ORDER — IOHEXOL 300 MG/ML  SOLN
INTRAMUSCULAR | Status: DC | PRN
  Administered 2020-07-18: 64 mL

## 2020-07-18 MED ORDER — NOREPINEPHRINE 4 MG/250ML-% IV SOLN
0.0000 ug/min | INTRAVENOUS | Status: DC
  Filled 2020-07-18: qty 250

## 2020-07-18 MED ORDER — SODIUM CHLORIDE 0.9% FLUSH: 3.0000 mL | Freq: Two times a day (BID) | INTRAVENOUS | Status: DC

## 2020-07-18 MED ORDER — CHLORHEXIDINE GLUCONATE 0.12 % MT SOLN
15.0000 mL | Freq: Once | OROMUCOSAL | Status: AC
  Administered 2020-07-19: 15 mL via OROMUCOSAL
  Filled 2020-07-18: qty 15

## 2020-07-18 MED ORDER — TRANEXAMIC ACID (OHS) BOLUS VIA INFUSION
15.0000 mg/kg | INTRAVENOUS | Status: AC
  Administered 2020-07-19: 1837.5 mg via INTRAVENOUS
  Filled 2020-07-18: qty 1838

## 2020-07-18 MED ORDER — CEFAZOLIN IN SODIUM CHLORIDE 3-0.9 GM/100ML-% IV SOLN
3.0000 g | INTRAVENOUS | Status: AC
  Administered 2020-07-19: 3 g via INTRAVENOUS
  Filled 2020-07-18: qty 100

## 2020-07-18 MED ORDER — MIDAZOLAM HCL 2 MG/2ML IJ SOLN
INTRAMUSCULAR | Status: DC | PRN
  Administered 2020-07-18: 1 mg via INTRAVENOUS

## 2020-07-18 MED ORDER — FINASTERIDE 5 MG PO TABS
5.0000 mg | ORAL_TABLET | Freq: Every day | ORAL | Status: DC
  Filled 2020-07-18: qty 1

## 2020-07-18 MED ORDER — POTASSIUM CHLORIDE 2 MEQ/ML IV SOLN
80.0000 meq | INTRAVENOUS | Status: DC
  Filled 2020-07-18: qty 40

## 2020-07-18 MED ORDER — ASPIRIN EC 81 MG PO TBEC: 81.0000 mg | DELAYED_RELEASE_TABLET | Freq: Every day | ORAL | Status: DC

## 2020-07-18 MED ORDER — ACETAMINOPHEN 325 MG PO TABS: 650.0000 mg | ORAL_TABLET | ORAL | Status: DC | PRN

## 2020-07-18 MED ORDER — TRANEXAMIC ACID 1000 MG/10ML IV SOLN
1.5000 mg/kg/h | INTRAVENOUS | Status: AC
  Administered 2020-07-19: 1.5 mg/kg/h via INTRAVENOUS
  Filled 2020-07-18: qty 25

## 2020-07-18 MED ORDER — MAGNESIUM SULFATE 50 % IJ SOLN: 40.0000 meq | INTRAMUSCULAR | Status: DC

## 2020-07-18 MED ORDER — EPINEPHRINE HCL 5 MG/250ML IV SOLN IN NS
0.0000 ug/min | INTRAVENOUS | Status: DC
Start: 2020-07-19 — End: 2020-07-19
  Filled 2020-07-18 (×2): qty 250

## 2020-07-18 MED ORDER — PHENYLEPHRINE HCL-NACL 20-0.9 MG/250ML-% IV SOLN
30.0000 ug/min | INTRAVENOUS | Status: DC
  Filled 2020-07-18: qty 250

## 2020-07-18 MED ORDER — METOPROLOL TARTRATE 12.5 MG HALF TABLET
12.5000 mg | ORAL_TABLET | Freq: Once | ORAL | Status: DC
  Filled 2020-07-18: qty 1

## 2020-07-18 MED ORDER — VERAPAMIL HCL 2.5 MG/ML IV SOLN
INTRAVENOUS | Status: DC | PRN
  Administered 2020-07-18: 2.5 mg via INTRA_ARTERIAL

## 2020-07-18 MED ORDER — ROSUVASTATIN CALCIUM 20 MG PO TABS
20.0000 mg | ORAL_TABLET | Freq: Every day | ORAL | Status: DC
  Administered 2020-07-18 – 2020-07-24 (×6): 20 mg via ORAL
  Filled 2020-07-18 (×6): qty 1

## 2020-07-18 MED ORDER — HEPARIN (PORCINE) 25000 UT/250ML-% IV SOLN
1500.0000 [IU]/h | INTRAVENOUS | Status: DC
  Administered 2020-07-18: 1500 [IU]/h via INTRAVENOUS
  Filled 2020-07-18: qty 250

## 2020-07-18 MED ORDER — TRANEXAMIC ACID (OHS) PUMP PRIME SOLUTION
2.0000 mg/kg | INTRAVENOUS | Status: DC
  Filled 2020-07-18: qty 2.45

## 2020-07-18 MED ORDER — CEFAZOLIN SODIUM-DEXTROSE 2-4 GM/100ML-% IV SOLN: 2.0000 g | INTRAVENOUS | Status: DC

## 2020-07-18 MED ORDER — VERAPAMIL HCL 2.5 MG/ML IV SOLN
INTRAVENOUS | Status: AC
  Filled 2020-07-18: qty 2

## 2020-07-18 MED ORDER — NITROGLYCERIN 0.4 MG SL SUBL: 0.4000 mg | SUBLINGUAL_TABLET | SUBLINGUAL | Status: DC | PRN

## 2020-07-18 MED ORDER — SODIUM CHLORIDE 0.9 % IV SOLN
INTRAVENOUS | Status: DC
  Filled 2020-07-18: qty 30

## 2020-07-18 MED ORDER — MILRINONE LACTATE IN DEXTROSE 20-5 MG/100ML-% IV SOLN
0.3000 ug/kg/min | INTRAVENOUS | Status: DC
  Filled 2020-07-18: qty 100

## 2020-07-18 MED ORDER — HEPARIN (PORCINE) IN NACL 1000-0.9 UT/500ML-% IV SOLN
INTRAVENOUS | Status: DC | PRN
  Administered 2020-07-18: 1000 mL

## 2020-07-18 MED ORDER — CHLORHEXIDINE GLUCONATE CLOTH 2 % EX PADS
6.0000 | MEDICATED_PAD | Freq: Once | CUTANEOUS | Status: AC
  Administered 2020-07-18: 6 via TOPICAL

## 2020-07-18 MED ORDER — PANTOPRAZOLE SODIUM 40 MG PO TBEC: 40.0000 mg | DELAYED_RELEASE_TABLET | Freq: Every day | ORAL | Status: DC

## 2020-07-18 MED ORDER — CHLORHEXIDINE GLUCONATE CLOTH 2 % EX PADS: 6.0000 | MEDICATED_PAD | Freq: Once | CUTANEOUS | Status: AC

## 2020-07-18 MED ORDER — BISACODYL 5 MG PO TBEC: 5.0000 mg | DELAYED_RELEASE_TABLET | Freq: Once | ORAL | Status: DC

## 2020-07-18 MED ORDER — LIDOCAINE HCL (PF) 1 % IJ SOLN
INTRAMUSCULAR | Status: AC
  Filled 2020-07-18: qty 30

## 2020-07-18 MED ORDER — SODIUM CHLORIDE 0.9 % IV SOLN: 250.0000 mL | INTRAVENOUS | Status: DC | PRN

## 2020-07-18 MED ORDER — MILRINONE LACTATE IN DEXTROSE 20-5 MG/100ML-% IV SOLN: 0.3000 ug/kg/min | INTRAVENOUS | Status: DC

## 2020-07-18 MED ORDER — INSULIN REGULAR(HUMAN) IN NACL 100-0.9 UT/100ML-% IV SOLN: INTRAVENOUS | Status: DC

## 2020-07-18 MED ORDER — SODIUM CHLORIDE 0.9% FLUSH: 3.0000 mL | INTRAVENOUS | Status: DC | PRN

## 2020-07-18 MED ORDER — ALBUTEROL SULFATE (2.5 MG/3ML) 0.083% IN NEBU: 2.5000 mg | INHALATION_SOLUTION | RESPIRATORY_TRACT | Status: DC | PRN

## 2020-07-18 MED ORDER — CEFAZOLIN IN SODIUM CHLORIDE 3-0.9 GM/100ML-% IV SOLN: 3.0000 g | INTRAVENOUS | Status: DC

## 2020-07-18 MED ORDER — FENTANYL CITRATE (PF) 100 MCG/2ML IJ SOLN
INTRAMUSCULAR | Status: AC
  Filled 2020-07-18: qty 2

## 2020-07-18 MED ORDER — ~~LOC~~ CARDIAC SURGERY, PATIENT & FAMILY EDUCATION
Freq: Once | Status: AC
  Filled 2020-07-18: qty 1

## 2020-07-18 MED ORDER — ROSUVASTATIN CALCIUM 20 MG PO TABS: 20.0000 mg | ORAL_TABLET | Freq: Every day | ORAL | Status: DC

## 2020-07-18 MED ORDER — VANCOMYCIN HCL 1500 MG/300ML IV SOLN: 1500.0000 mg | INTRAVENOUS | Status: DC

## 2020-07-18 MED ORDER — TEMAZEPAM 15 MG PO CAPS
15.0000 mg | ORAL_CAPSULE | Freq: Once | ORAL | Status: DC | PRN
  Filled 2020-07-18: qty 1

## 2020-07-18 MED ORDER — DEXMEDETOMIDINE HCL IN NACL 400 MCG/100ML IV SOLN
0.1000 ug/kg/h | INTRAVENOUS | Status: AC
  Administered 2020-07-19: .3 ug/kg/h via INTRAVENOUS
  Filled 2020-07-18 (×2): qty 100

## 2020-07-18 MED ORDER — PANTOPRAZOLE SODIUM 40 MG PO TBEC: 40.0000 mg | DELAYED_RELEASE_TABLET | Freq: Two times a day (BID) | ORAL | Status: DC

## 2020-07-18 MED ORDER — INSULIN REGULAR(HUMAN) IN NACL 100-0.9 UT/100ML-% IV SOLN
INTRAVENOUS | Status: AC
  Administered 2020-07-19: 1 [IU]/h via INTRAVENOUS
  Filled 2020-07-18: qty 100

## 2020-07-18 MED ORDER — ONDANSETRON HCL 4 MG/2ML IJ SOLN: 4.0000 mg | Freq: Four times a day (QID) | INTRAMUSCULAR | Status: DC | PRN

## 2020-07-18 MED ORDER — SODIUM CHLORIDE 0.9 % IV SOLN: INTRAVENOUS | Status: DC

## 2020-07-18 MED ORDER — ACETAMINOPHEN 325 MG PO TABS
650.0000 mg | ORAL_TABLET | ORAL | Status: DC | PRN
Start: 2020-07-18 — End: 2020-07-19

## 2020-07-18 MED ORDER — MAGNESIUM SULFATE 50 % IJ SOLN
40.0000 meq | INTRAMUSCULAR | Status: DC
  Filled 2020-07-18: qty 9.85

## 2020-07-18 MED ORDER — PLASMA-LYTE A IV SOLN: INTRAVENOUS | Status: DC

## 2020-07-18 MED ORDER — TRANEXAMIC ACID (OHS) BOLUS VIA INFUSION: 15.0000 mg/kg | INTRAVENOUS | Status: DC

## 2020-07-18 MED ORDER — POTASSIUM CHLORIDE 2 MEQ/ML IV SOLN: 80.0000 meq | INTRAVENOUS | Status: DC

## 2020-07-18 MED ORDER — VANCOMYCIN HCL 1500 MG/300ML IV SOLN
1500.0000 mg | INTRAVENOUS | Status: AC
  Administered 2020-07-19: 1500 mg via INTRAVENOUS
  Filled 2020-07-18 (×2): qty 300

## 2020-07-18 MED ORDER — TRANEXAMIC ACID (OHS) PUMP PRIME SOLUTION: 2.0000 mg/kg | INTRAVENOUS | Status: DC

## 2020-07-18 MED ORDER — EPINEPHRINE HCL 5 MG/250ML IV SOLN IN NS: 0.0000 ug/min | INTRAVENOUS | Status: DC

## 2020-07-18 MED ORDER — HEPARIN BOLUS VIA INFUSION
4000.0000 [IU] | Freq: Once | INTRAVENOUS | Status: AC
  Administered 2020-07-18: 4000 [IU] via INTRAVENOUS
  Filled 2020-07-18: qty 4000

## 2020-07-18 MED ORDER — ASPIRIN 81 MG PO CHEW
324.0000 mg | CHEWABLE_TABLET | Freq: Once | ORAL | Status: AC
  Administered 2020-07-18: 324 mg via ORAL
  Filled 2020-07-18: qty 4

## 2020-07-18 MED ORDER — ALBUTEROL SULFATE HFA 108 (90 BASE) MCG/ACT IN AERS: 2.0000 | INHALATION_SPRAY | RESPIRATORY_TRACT | Status: DC | PRN

## 2020-07-18 MED ORDER — ONDANSETRON HCL 4 MG/2ML IJ SOLN: 4.0000 mg | Freq: Three times a day (TID) | INTRAMUSCULAR | Status: DC | PRN

## 2020-07-18 MED ORDER — PHENYLEPHRINE HCL-NACL 20-0.9 MG/250ML-% IV SOLN: 30.0000 ug/min | INTRAVENOUS | Status: DC

## 2020-07-18 MED ORDER — HEPARIN SODIUM (PORCINE) 1000 UNIT/ML IJ SOLN
INTRAMUSCULAR | Status: AC
  Filled 2020-07-18: qty 1

## 2020-07-18 MED ORDER — MORPHINE SULFATE (PF) 2 MG/ML IV SOLN: 2.0000 mg | INTRAVENOUS | Status: DC | PRN

## 2020-07-18 MED ORDER — ASPIRIN 81 MG PO CHEW: 81.0000 mg | CHEWABLE_TABLET | ORAL | Status: DC

## 2020-07-18 MED ORDER — ASPIRIN EC 325 MG PO TBEC: 325.0000 mg | DELAYED_RELEASE_TABLET | Freq: Every day | ORAL | Status: DC

## 2020-07-18 MED ORDER — CEFAZOLIN SODIUM-DEXTROSE 2-4 GM/100ML-% IV SOLN
2.0000 g | INTRAVENOUS | Status: AC
  Administered 2020-07-19: 2 g via INTRAVENOUS
  Filled 2020-07-18: qty 100

## 2020-07-18 MED ORDER — ACETAMINOPHEN 650 MG RE SUPP: 650.0000 mg | Freq: Four times a day (QID) | RECTAL | Status: DC | PRN

## 2020-07-18 MED ORDER — NITROGLYCERIN IN D5W 200-5 MCG/ML-% IV SOLN
2.0000 ug/min | INTRAVENOUS | Status: AC
Start: 2020-07-19 — End: 2020-07-19
  Administered 2020-07-19: 16.6 ug/min via INTRAVENOUS
  Filled 2020-07-18: qty 250

## 2020-07-18 MED ORDER — NITROGLYCERIN IN D5W 200-5 MCG/ML-% IV SOLN: 2.0000 ug/min | INTRAVENOUS | Status: DC

## 2020-07-18 MED ORDER — NOREPINEPHRINE 4 MG/250ML-% IV SOLN: 0.0000 ug/min | INTRAVENOUS | Status: DC

## 2020-07-18 MED ORDER — TRANEXAMIC ACID 1000 MG/10ML IV SOLN: 1.5000 mg/kg/h | INTRAVENOUS | Status: DC

## 2020-07-18 MED ORDER — HEPARIN SODIUM (PORCINE) 1000 UNIT/ML IJ SOLN
INTRAMUSCULAR | Status: DC | PRN
  Administered 2020-07-18: 6000 [IU] via INTRAVENOUS

## 2020-07-18 MED ORDER — CEFAZOLIN SODIUM-DEXTROSE 2-4 GM/100ML-% IV SOLN
2.0000 g | INTRAVENOUS | Status: AC
  Administered 2020-07-19: 3 g via INTRAVENOUS
  Administered 2020-07-19: 2 g via INTRAVENOUS
  Filled 2020-07-18: qty 100

## 2020-07-18 MED ORDER — DEXMEDETOMIDINE HCL IN NACL 400 MCG/100ML IV SOLN: 0.1000 ug/kg/h | INTRAVENOUS | Status: DC

## 2020-07-18 MED ORDER — FENTANYL CITRATE (PF) 100 MCG/2ML IJ SOLN
INTRAMUSCULAR | Status: DC | PRN
  Administered 2020-07-18: 50 ug via INTRAVENOUS

## 2020-07-18 SURGICAL SUPPLY — 11 items
CATH INFINITI 5F PIG 125CM (CATHETERS) ×2 IMPLANT
CATH INFINITI 5FR JK (CATHETERS) ×2 IMPLANT
DEVICE RAD TR BAND REGULAR (VASCULAR PRODUCTS) ×2 IMPLANT
DRAPE BRACHIAL (DRAPES) ×2 IMPLANT
GLIDESHEATH SLEND SS 6F .021 (SHEATH) ×2 IMPLANT
GUIDEWIRE INQWIRE 1.5J.035X260 (WIRE) IMPLANT
INQWIRE 1.5J .035X260CM (WIRE) ×3
PACK CARDIAC CATH (CUSTOM PROCEDURE TRAY) ×3 IMPLANT
PROTECTION STATION PRESSURIZED (MISCELLANEOUS) ×3
SET ATX SIMPLICITY (MISCELLANEOUS) ×2 IMPLANT
STATION PROTECTION PRESSURIZED (MISCELLANEOUS) IMPLANT

## 2020-07-18 NOTE — Consult Note (Signed)
Cardiology Consultation:   Patient ID: IVIE MAESE MRN: [id number]; DOB: 1945-02-16  Admit date: 07/18/2020 Date of Consult: 07/18/2020  PCP:  Adin Hector, MD   [facility] Providers Cardiologist:  None   new Fletcher Anon)    Patient Profile:   MAJED PELLEGRIN is a 75 y.o. male with a hx of hyperlipidemia, GERD and BPH who is being seen 07/18/2020 for the evaluation of unstable angina at the request of Dr. Tamala Julian.  History of Present Illness:   Mr. Paolucci is a 75 year old male with no prior cardiac history.  He has known history of hyperlipidemia, BPH, kidney stones, dysphagia, GERD and peptic ulcer disease.  He does not smoke on a regular basis but reports occasional cigar use. He woke up this morning feeling fine and had his coffee and breakfast with no issues.  He was unloading some stuff from the truck and started having substernal chest pain described as tightness feeling radiating to his left arm.  This was associated with diaphoresis followed by nausea but no vomiting.  The pain eased but then returned again.  His symptoms continued on and off for about 2 hours and currently he is chest pain-free.  He reports no previous similar symptoms. No previous cardiac history or cardiac work-up. Upon presentation to the ED, he was noted to be mildly hypertensive.  EKG showed sinus rhythm with right bundle branch block.  PVCs were noted.   Past Medical History:  Diagnosis Date  . Benign neoplasm of large bowel   . BPH with obstruction/lower urinary tract symptoms   . Dysphagia   . Erectile dysfunction   . Heartburn   . History of kidney stones   . History of stomach ulcers   . HLD (hyperlipidemia)   . Hypogonadism in male   . Obesity   . Urinary frequency   . Urinary urgency   . Vitamin D deficiency     Past Surgical History:  Procedure Laterality Date  . APPENDECTOMY       Home Medications:  Prior to Admission medications   Medication Sig Start Date End Date Taking?  Authorizing Provider  finasteride (PROSCAR) 5 MG tablet Take 1 tablet (5 mg total) by mouth daily. 03/03/20   Zara Council A, PA-C  Glucosamine-Chondroitin 750-600 MG TABS Take by mouth.    [provider]  omeprazole (PRILOSEC) 40 MG capsule Take by mouth. 09/21/14   [provider]    Inpatient Medications: Scheduled Meds: . heparin  4,000 Units Intravenous Once   Continuous Infusions: . sodium chloride    . heparin     PRN Meds: acetaminophen, morphine injection, nitroGLYCERIN, ondansetron (ZOFRAN) IV  Allergies:    Allergies  Allergen Reactions  . Androgel [Testosterone]   . Lovastatin     Social History:   Social History   Socioeconomic History  . Marital status: Single    Spouse name: Not on file  . Number of children: Not on file  . Years of education: Not on file  . Highest education level: Not on file  Occupational History  . Not on file  Tobacco Use  . Smoking status: Current Some Day Smoker    Types: Cigars  . Smokeless tobacco: Never Used  Vaping Use  . Vaping Use: Never used  Substance and Sexual Activity  . Alcohol use: Yes    Alcohol/week: 0.0 standard drinks    Comment: Rare  . Drug use: No  . Sexual activity: Not on file  Other Topics Concern  . Not on file  Social History Narrative  . Not on file   Social Determinants of Health   Financial Resource Strain: Not on file  Food Insecurity: Not on file  Transportation Needs: Not on file  Physical Activity: Not on file  Stress: Not on file  Social Connections: Not on file  Intimate Partner Violence: Not on file    Family History:    Family History  Problem Relation Age of Onset  . Alzheimer's disease Father   . Prostate cancer Brother   . Diabetes Mellitus II Mother   . Stroke Mother      ROS:  Please see the history of present illness.   All other ROS reviewed and negative.     Physical Exam/Data:   Vitals:   07/18/20 0930 07/18/20 1008 07/18/20 1030  07/18/20 1115  BP: 138/78 117/65 117/71   Pulse: 60 62 60 (!) 58  Resp: 14 17 17 19   Temp:      TempSrc:      SpO2: 99% 98% 99% 98%  Weight:      Height:       No intake or output data in the 24 hours ending 07/18/20 1203 Last 3 Weights 07/18/2020 03/19/2020 03/03/2020  Weight (lbs) 270 lb 285 lb 283 lb  Weight (kg) 122.471 kg 129.275 kg 128.368 kg     Body mass index is 32.02 kg/m.  General:  Well nourished, well developed, in no acute distress HEENT: normal Lymph: no adenopathy Neck: no JVD Endocrine:  No thryomegaly Vascular: No carotid bruits; FA pulses 2+ bilaterally without bruits  Cardiac:  normal S1, S2; RRR; no murmur  Lungs:  clear to auscultation bilaterally, no wheezing, rhonchi or rales  Abd: soft, nontender, no hepatomegaly  Ext: no edema Musculoskeletal:  No deformities, BUE and BLE strength normal and equal Skin: warm and dry  Neuro:  CNs 2-12 intact, no focal abnormalities noted Psych:  Normal affect   EKG:  The EKG was personally reviewed and demonstrates: Normal sinus rhythm with PVCs Telemetry:  Telemetry was personally reviewed and demonstrates: Normal sinus rhythm with PVC  Relevant CV Studies:   Laboratory Data:  High Sensitivity Troponin:   Recent Labs  Lab 07/18/20 1019  TROPONINIHS 20*     Chemistry Recent Labs  Lab 07/18/20 1019  NA 139  K 4.0  CL 106  CO2 23  GLUCOSE 171*  BUN 18  CREATININE 0.91  CALCIUM 8.6*  GFRNONAA >60  ANIONGAP 10    Recent Labs  Lab 07/18/20 1019  PROT 6.6  ALBUMIN 4.0  AST 20  ALT 20  ALKPHOS 45  BILITOT 0.7   Hematology Recent Labs  Lab 07/18/20 1019  WBC 6.0  RBC 4.46  HGB 13.8  HCT 39.8  MCV 89.2  MCH 30.9  MCHC 34.7  RDW 12.7  PLT 200   BNPNo results for input(s): BNP, PROBNP in the last 168 hours.  DDimer No results for input(s): DDIMER in the last 168 hours.   Radiology/Studies:  DG Chest 2 View  Result Date: 07/18/2020 CLINICAL DATA:  Chest tightness with shortness of  breath.  Smoker. EXAM: CHEST - 2 VIEW COMPARISON:  11/14/2011 CT FINDINGS: Mild hyperinflation. Numerous leads and wires project over the chest. Midline trachea. Normal heart size. Tortuous thoracic aorta. No pleural effusion or pneumothorax. Mild pulmonary interstitial thickening. No lobar consolidation. IMPRESSION: 1. No acute cardiopulmonary disease. 2. Hyperinflation and interstitial thickening, likely related to smoking/chronic bronchitis. Electronically  Signed   By: Abigail Miyamoto M.D.   On: 07/18/2020 10:32     Assessment and Plan:   1. Unstable angina: Likely non-ST elevation myocardial infarction as I suspect his troponin will go up.  He has a classic presentation.  I agree with aspirin, unfractionated heparin, beta-blocker and a high-dose statin.  I discussed different management options with him and recommend proceeding with left heart catheterization possible PCI.  I discussed the procedure in details as well as risk and benefits.  We will plan to do the procedure in the afternoon. 2. Hyperlipidemia: Recommend high-dose atorvastatin. 3. Elevated blood pressure: We will initiate treatment with a beta-blocker.   Risk Assessment/Risk Scores:   TIMI Risk Score for Unstable Angina or Non-ST Elevation MI:   The patient's TIMI risk score is 4, which indicates a 20% risk of all cause mortality, new or recurrent myocardial infarction or need for urgent revascularization in the next 14 days.{   For questions or updates, please contact [place] Please consult [email or web address] for contact info under    Signed, [name], MD  07/18/2020 12:03 PM

## 2020-07-18 NOTE — ED Triage Notes (Signed)
Pt states he was about to go to work and had sudden onset substernal chest tightness with SOB , nausea and diaphoresis.

## 2020-07-18 NOTE — Consult Note (Signed)
[facility] for Heparin  Indication: chest pain/ACS  Allergies  Allergen Reactions  . Androgel [Testosterone]   . Lovastatin     Patient Measurements: Height: 6\' 5"  (195.6 cm) Weight: 122.5 kg (270 lb) IBW/kg (Calculated) : 89.1 Heparin Dosing Weight: 114.7 kg  Vital Signs: Temp: 98.1 F (36.7 C) (06/07 0923) Temp Source: Oral (06/07 0923) BP: 117/71 (06/07 1030) Pulse Rate: 60 (06/07 1030)  Labs: Recent Labs    07/18/20 1019  HGB 13.8  HCT 39.8  PLT 200  CREATININE 0.91  TROPONINIHS 20*    Estimated Creatinine Clearance: 103.3 mL/min (by C-G formula based on SCr of 0.91 mg/dL).   Medical History: Past Medical History:  Diagnosis Date  . Benign neoplasm of large bowel   . BPH with obstruction/lower urinary tract symptoms   . Dysphagia   . Erectile dysfunction   . Heartburn   . History of kidney stones   . History of stomach ulcers   . HLD (hyperlipidemia)   . Hypogonadism in male   . Obesity   . Urinary frequency   . Urinary urgency   . Vitamin D deficiency     Medications:  (Not in a hospital admission)  Scheduled:   Infusions:   PRN: nitroGLYCERIN Anti-infectives (From admission, onward)   None      Assessment: Pharmacy consulted to start heparin for ACS. No note of DOAC PTA. Trop 20. CBC stable.   Goal of Therapy:  Heparin level 0.3-0.7 units/ml Monitor platelets by anticoagulation protocol: Yes   Plan:  Give 4000 units bolus x 1 Start heparin infusion at 1500 units/hr Check anti-Xa level in 8 hours and daily while on heparin Continue to monitor H&H and platelets  Oswald Hillock, PharmD  07/18/2020,11:30 AM

## 2020-07-18 NOTE — Progress Notes (Signed)
Pre-CABG study completed.  ° °Please see CV Proc for preliminary results.  ° °Rohit Deloria, RDMS, RVT ° °

## 2020-07-18 NOTE — Progress Notes (Signed)
Paged Cardiology to advise patient had arrived on the floor awaiting orders.

## 2020-07-18 NOTE — H&P (View-Only) (Signed)
Cardiology Consultation:   Patient ID: Brian Banks MRN: [id number]; DOB: 26-Aug-1945  Admit date: 07/18/2020 Date of Consult: 07/18/2020  PCP:  Adin Hector, MD   [facility] Providers Cardiologist:  None   new Fletcher Anon)    Patient Profile:   Brian Banks is a 75 y.o. male with a hx of hyperlipidemia, GERD and BPH who is being seen 07/18/2020 for the evaluation of unstable angina at the request of Dr. Tamala Julian.  History of Present Illness:   [name] is a 75 year old male with no prior cardiac history.  He has known history of hyperlipidemia, BPH, kidney stones, dysphagia, GERD and peptic ulcer disease.  He does not smoke on a regular basis but reports occasional cigar use. He woke up this morning feeling fine and had his coffee and breakfast with no issues.  He was unloading some stuff from the truck and started having substernal chest pain described as tightness feeling radiating to his left arm.  This was associated with diaphoresis followed by nausea but no vomiting.  The pain eased but then returned again.  His symptoms continued on and off for about 2 hours and currently he is chest pain-free.  He reports no previous similar symptoms. No previous cardiac history or cardiac work-up. Upon presentation to the ED, he was noted to be mildly hypertensive.  EKG showed sinus rhythm with right bundle branch block.  PVCs were noted.   Past Medical History:  Diagnosis Date  . Benign neoplasm of large bowel   . BPH with obstruction/lower urinary tract symptoms   . Dysphagia   . Erectile dysfunction   . Heartburn   . History of kidney stones   . History of stomach ulcers   . HLD (hyperlipidemia)   . Hypogonadism in male   . Obesity   . Urinary frequency   . Urinary urgency   . Vitamin D deficiency     Past Surgical History:  Procedure Laterality Date  . APPENDECTOMY       Home Medications:  Prior to Admission medications   Medication Sig Start Date End Date Taking?  Authorizing Provider  finasteride (PROSCAR) 5 MG tablet Take 1 tablet (5 mg total) by mouth daily. 03/03/20   Zara Council A, PA-C  Glucosamine-Chondroitin 750-600 MG TABS Take by mouth.    [provider]  omeprazole (PRILOSEC) 40 MG capsule Take by mouth. 09/21/14   [provider]    Inpatient Medications: Scheduled Meds: . heparin  4,000 Units Intravenous Once   Continuous Infusions: . sodium chloride    . heparin     PRN Meds: acetaminophen, morphine injection, nitroGLYCERIN, ondansetron (ZOFRAN) IV  Allergies:    Allergies  Allergen Reactions  . Androgel [Testosterone]   . Lovastatin     Social History:   Social History   Socioeconomic History  . Marital status: Single    Spouse name: Not on file  . Number of children: Not on file  . Years of education: Not on file  . Highest education level: Not on file  Occupational History  . Not on file  Tobacco Use  . Smoking status: Current Some Day Smoker    Types: Cigars  . Smokeless tobacco: Never Used  Vaping Use  . Vaping Use: Never used  Substance and Sexual Activity  . Alcohol use: Yes    Alcohol/week: 0.0 standard drinks    Comment: Rare  . Drug use: No  . Sexual activity: Not on file  Other Topics Concern  . Not on file  Social History Narrative  . Not on file   Social Determinants of Health   Financial Resource Strain: Not on file  Food Insecurity: Not on file  Transportation Needs: Not on file  Physical Activity: Not on file  Stress: Not on file  Social Connections: Not on file  Intimate Partner Violence: Not on file    Family History:    Family History  Problem Relation Age of Onset  . Alzheimer's disease Father   . Prostate cancer Brother   . Diabetes Mellitus II Mother   . Stroke Mother      ROS:  Please see the history of present illness.   All other ROS reviewed and negative.     Physical Exam/Data:   Vitals:   07/18/20 0930 07/18/20 1008 07/18/20 1030  07/18/20 1115  BP: 138/78 117/65 117/71   Pulse: 60 62 60 (!) 58  Resp: 14 17 17 19   Temp:      TempSrc:      SpO2: 99% 98% 99% 98%  Weight:      Height:       No intake or output data in the 24 hours ending 07/18/20 1203 Last 3 Weights 07/18/2020 03/19/2020 03/03/2020  Weight (lbs) 270 lb 285 lb 283 lb  Weight (kg) 122.471 kg 129.275 kg 128.368 kg     Body mass index is 32.02 kg/m.  General:  Well nourished, well developed, in no acute distress HEENT: normal Lymph: no adenopathy Neck: no JVD Endocrine:  No thryomegaly Vascular: No carotid bruits; FA pulses 2+ bilaterally without bruits  Cardiac:  normal S1, S2; RRR; no murmur  Lungs:  clear to auscultation bilaterally, no wheezing, rhonchi or rales  Abd: soft, nontender, no hepatomegaly  Ext: no edema Musculoskeletal:  No deformities, BUE and BLE strength normal and equal Skin: warm and dry  Neuro:  CNs 2-12 intact, no focal abnormalities noted Psych:  Normal affect   EKG:  The EKG was personally reviewed and demonstrates: Normal sinus rhythm with PVCs Telemetry:  Telemetry was personally reviewed and demonstrates: Normal sinus rhythm with PVC  Relevant CV Studies:   Laboratory Data:  High Sensitivity Troponin:   Recent Labs  Lab 07/18/20 1019  TROPONINIHS 20*     Chemistry Recent Labs  Lab 07/18/20 1019  NA 139  K 4.0  CL 106  CO2 23  GLUCOSE 171*  BUN 18  CREATININE 0.91  CALCIUM 8.6*  GFRNONAA >60  ANIONGAP 10    Recent Labs  Lab 07/18/20 1019  PROT 6.6  ALBUMIN 4.0  AST 20  ALT 20  ALKPHOS 45  BILITOT 0.7   Hematology Recent Labs  Lab 07/18/20 1019  WBC 6.0  RBC 4.46  HGB 13.8  HCT 39.8  MCV 89.2  MCH 30.9  MCHC 34.7  RDW 12.7  PLT 200   BNPNo results for input(s): BNP, PROBNP in the last 168 hours.  DDimer No results for input(s): DDIMER in the last 168 hours.   Radiology/Studies:  DG Chest 2 View  Result Date: 07/18/2020 CLINICAL DATA:  Chest tightness with shortness of  breath.  Smoker. EXAM: CHEST - 2 VIEW COMPARISON:  11/14/2011 CT FINDINGS: Mild hyperinflation. Numerous leads and wires project over the chest. Midline trachea. Normal heart size. Tortuous thoracic aorta. No pleural effusion or pneumothorax. Mild pulmonary interstitial thickening. No lobar consolidation. IMPRESSION: 1. No acute cardiopulmonary disease. 2. Hyperinflation and interstitial thickening, likely related to smoking/chronic bronchitis. Electronically  Signed   By: Abigail Miyamoto M.D.   On: 07/18/2020 10:32     Assessment and Plan:   1. Unstable angina: Likely non-ST elevation myocardial infarction as I suspect his troponin will go up.  He has a classic presentation.  I agree with aspirin, unfractionated heparin, beta-blocker and a high-dose statin.  I discussed different management options with him and recommend proceeding with left heart catheterization possible PCI.  I discussed the procedure in details as well as risk and benefits.  We will plan to do the procedure in the afternoon. 2. Hyperlipidemia: Recommend high-dose atorvastatin. 3. Elevated blood pressure: We will initiate treatment with a beta-blocker.   Risk Assessment/Risk Scores:   TIMI Risk Score for Unstable Angina or Non-ST Elevation MI:   The patient's TIMI risk score is 4, which indicates a 20% risk of all cause mortality, new or recurrent myocardial infarction or need for urgent revascularization in the next 14 days.{   For questions or updates, please contact [place] Please consult [email or web address] for contact info under    Signed, [name], MD  07/18/2020 12:03 PM

## 2020-07-18 NOTE — Discharge Summary (Signed)
Physician Discharge Summary  Brian Banks EHU:[id number] DOB: 1945/09/21 DOA: 07/18/2020  PCP: Adin Hector, MD  Admit date: 07/18/2020 Discharge date: 07/18/2020  Recommendations for Outpatient Follow-up: -pt will be transported to [facility] for possible CABG  Home Health: None Equipment/Devices: None  Discharge Condition: Stable CODE STATUS: Full Diet recommendation:   Brief/Interim Summary (HPI)  Brian Banks is a 75 y.o. male with medical history significant of hyperlipidemia, GERD, hypogonadism, stomach ulcers, kidney stone, BPH, chewing cigar (not smoking cigarette), who presents with chest pain.   Patient states that his chest pain started suddenly and this morning when he was unloading some stuff from the truck.  The chest pain is located in substernal area, pressure-like, initially 4 out of 10 severity, currently chest pain-free, radiating to the left arm, associated with minimal shortness of breath.  Patient does not have cough, fever or chills.  Patient had nausea, no vomiting, diarrhea or abdominal pain.  No symptoms of UTI.  Patient does not have recent fall or head injury.  No rectal bleeding or dark stool.  ED Course: pt was found to have troponin level 20 -->70, WBC 6.0, pending COVID PCR, electrolytes renal function okay, temperature normal, blood pressure 117/71, heart rate 60, RR 17, oxygen saturation 99% on room air.  Chest x-ray negative for infiltration, showed bronchitic change.  Patient is placed on progressive bed for observation.  Dr. Fletcher Anon of card is consulted.   Subjective  -Chest pain  Discharge Diagnoses and Hospital Course:   Principal Problem:   Chest pain Active Problems:   BPH with obstruction/lower urinary tract symptoms   GERD (gastroesophageal reflux disease)   HLD (hyperlipidemia)   NSTEMI (non-ST elevated myocardial infarction) (HCC)   Chest pain and elevated troponin: trop 20 --->70, indicating possible non-STEMI.  Dr.  Fletcher Anon of card is consulted. Patient underwent urgent cardiac catheterization.  Per Dr. Fletcher Anon, cardiac catheterization showed ostial and proximal LAD disease, not approachable with stent.  Patient will need to be transferred to [facility] for CABG.  Dr. Sophronia Simas will arrange transportation.  Patient will no longer need to be in hosptalist service at [facility].  - IV heparin in hospital - Trend Trop - prn Nitroglycerin, Morphine, and aspirin - pt is allergic to lovastatin, will f/u FLP. May need to start Zetia - Risk factor stratification: checked FLP and A1C   BPH with obstruction/lower urinary tract symptoms: -Proscar  HLD (hyperlipidemia) -f/u FLP  GERD: Patient has history of gastric ulcer -Increased protonix to 40 mg twice daily due to ASA use  Discharge Instructions   Allergies as of 07/18/2020      Reactions   Androgel [testosterone]    Lovastatin     Med Rec must be completed prior to using this [place]       Allergies  Allergen Reactions  . Androgel [Testosterone]   . Lovastatin     Consultations:  card   Procedures/Studies: DG Chest 2 View  Result Date: 07/18/2020 CLINICAL DATA:  Chest tightness with shortness of breath.  Smoker. EXAM: CHEST - 2 VIEW COMPARISON:  11/14/2011 CT FINDINGS: Mild hyperinflation. Numerous leads and wires project over the chest. Midline trachea. Normal heart size. Tortuous thoracic aorta. No pleural effusion or pneumothorax. Mild pulmonary interstitial thickening. No lobar consolidation. IMPRESSION: 1. No acute cardiopulmonary disease. 2. Hyperinflation and interstitial thickening, likely related to smoking/chronic bronchitis. Electronically Signed   By: Abigail Miyamoto M.D.   On: 07/18/2020 10:32      Discharge  Exam: Vitals:   07/18/20 1401 07/18/20 1518  BP: 140/67 (!) 150/72  Pulse: (!) 59 (!) 59  Resp: 16 12  Temp: 97.6 F (36.4 C)   SpO2: 99% 98%   Vitals:   07/18/20 1030 07/18/20 1115 07/18/20 1401 07/18/20  1518  BP: 117/71  140/67 (!) 150/72  Pulse: 60 (!) 58 (!) 59 (!) 59  Resp: 17 19 16 12   Temp:   97.6 F (36.4 C)   TempSrc:   Oral   SpO2: 99% 98% 99% 98%  Weight:      Height:        General: Not in acute distress HEENT:       Eyes: PERRL, EOMI, no scleral icterus.       ENT: No discharge from the ears and nose, no pharynx injection, no tonsillar enlargement.        Neck: No JVD, no bruit, no mass felt. Heme: No neck lymph node enlargement. Cardiac: S1/S2, RRR, No murmurs, No gallops or rubs. Respiratory: No rales, wheezing, rhonchi or rubs. GI: Soft, nondistended, nontender, no rebound pain, no organomegaly, BS present. GU: No hematuria Ext: No pitting leg edema bilaterally. 1+DP/PT pulse bilaterally. Musculoskeletal: No joint deformities, No joint redness or warmth, no limitation of ROM in spin. Skin: No rashes.  Neuro: Alert, oriented X3, cranial nerves II-XII grossly intact, moves all extremities normally. Muscle strength 5/5 in all extremities, sensation to light touch intact. Brachial reflex 2+ bilaterally. Knee reflex 1+ bilaterally. Negative Babinski's sign. Normal finger to nose test. Psych: Patient is not psychotic, no suicidal or hemocidal ideation.     The results of significant diagnostics from this hospitalization (including imaging, microbiology, ancillary and laboratory) are listed below for reference.     Microbiology: Recent Results (from the past 240 hour(s))  Resp Panel by RT-PCR (Flu A&B, Covid) Nasopharyngeal Swab     Status: None   Collection Time: 07/18/20 11:57 AM   Specimen: Nasopharyngeal Swab; Nasopharyngeal(NP) swabs in vial transport medium  Result Value Ref Range Status   SARS Coronavirus 2 by RT PCR NEGATIVE NEGATIVE Final    Comment: (NOTE) SARS-CoV-2 target nucleic acids are NOT DETECTED.  The SARS-CoV-2 RNA is generally detectable in upper respiratory specimens during the acute phase of infection. The lowest concentration of SARS-CoV-2  viral copies this assay can detect is 138 copies/mL. A negative result does not preclude SARS-Cov-2 infection and should not be used as the sole basis for treatment or other patient management decisions. A negative result may occur with  improper specimen collection/handling, submission of specimen other than nasopharyngeal swab, presence of viral mutation(s) within the areas targeted by this assay, and inadequate number of viral copies(<138 copies/mL). A negative result must be combined with clinical observations, patient history, and epidemiological information. The expected result is Negative.  Fact Sheet for Patients:  EntrepreneurPulse.com.au  Fact Sheet for Healthcare Providers:  IncredibleEmployment.be  This test is no t yet approved or cleared by the [place] FDA and  has been authorized for detection and/or diagnosis of SARS-CoV-2 by FDA under an Emergency Use Authorization (EUA). This EUA will remain  in effect (meaning this test can be used) for the duration of the COVID-19 declaration under Section 564(b)(1) of the Act, 21 U.S.C.section 360bbb-3(b)(1), unless the authorization is terminated  or revoked sooner.       Influenza A by PCR NEGATIVE NEGATIVE Final   Influenza B by PCR NEGATIVE NEGATIVE Final    Comment: (NOTE) The Xpert Xpress SARS-CoV-2/FLU/RSV plus  assay is intended as an aid in the diagnosis of influenza from Nasopharyngeal swab specimens and should not be used as a sole basis for treatment. Nasal washings and aspirates are unacceptable for Xpert Xpress SARS-CoV-2/FLU/RSV testing.  Fact Sheet for Patients: EntrepreneurPulse.com.au  Fact Sheet for Healthcare Providers: IncredibleEmployment.be  This test is not yet approved or cleared by the [place] FDA and has been authorized for detection and/or diagnosis of SARS-CoV-2 by FDA under an Emergency Use Authorization  (EUA). This EUA will remain in effect (meaning this test can be used) for the duration of the COVID-19 declaration under Section 564(b)(1) of the Act, 21 U.S.C. section 360bbb-3(b)(1), unless the authorization is terminated or revoked.  Performed at [facility], [address] [place], [place] [address]      Labs: BNP (last 3 results) No results for input(s): BNP in the last 8760 hours. Basic Metabolic Panel: Recent Labs  Lab 07/18/20 1019  NA 139  K 4.0  CL 106  CO2 23  GLUCOSE 171*  BUN 18  CREATININE 0.91  CALCIUM 8.6*   Liver Function Tests: Recent Labs  Lab 07/18/20 1019  AST 20  ALT 20  ALKPHOS 45  BILITOT 0.7  PROT 6.6  ALBUMIN 4.0   No results for input(s): LIPASE, AMYLASE in the last 168 hours. No results for input(s): AMMONIA in the last 168 hours. CBC: Recent Labs  Lab 07/18/20 1019  WBC 6.0  NEUTROABS 4.4  HGB 13.8  HCT 39.8  MCV 89.2  PLT 200   Cardiac Enzymes: No results for input(s): CKTOTAL, CKMB, CKMBINDEX, TROPONINI in the last 168 hours. BNP: Invalid input(s): POCBNP CBG: No results for input(s): GLUCAP in the last 168 hours. D-Dimer No results for input(s): DDIMER in the last 72 hours. Hgb A1c No results for input(s): HGBA1C in the last 72 hours. Lipid Profile Recent Labs    07/18/20 1019  CHOL 179  HDL 43  LDLCALC 117*  TRIG 95  CHOLHDL 4.2   Thyroid function studies No results for input(s): TSH, T4TOTAL, T3FREE, THYROIDAB in the last 72 hours.  Invalid input(s): FREET3 Anemia work up No results for input(s): VITAMINB12, FOLATE, FERRITIN, TIBC, IRON, RETICCTPCT in the last 72 hours. Urinalysis    Component Value Date/Time   APPEARANCEUR Clear 03/26/2019 0847   GLUCOSEU Negative 03/26/2019 0847   BILIRUBINUR Negative 03/26/2019 0847   PROTEINUR Negative 03/26/2019 0847   NITRITE Negative 03/26/2019 0847   LEUKOCYTESUR Negative 03/26/2019 0847   Sepsis Labs Invalid input(s): PROCALCITONIN,  WBC,   LACTICIDVEN Microbiology Recent Results (from the past 240 hour(s))  Resp Panel by RT-PCR (Flu A&B, Covid) Nasopharyngeal Swab     Status: None   Collection Time: 07/18/20 11:57 AM   Specimen: Nasopharyngeal Swab; Nasopharyngeal(NP) swabs in vial transport medium  Result Value Ref Range Status   SARS Coronavirus 2 by RT PCR NEGATIVE NEGATIVE Final    Comment: (NOTE) SARS-CoV-2 target nucleic acids are NOT DETECTED.  The SARS-CoV-2 RNA is generally detectable in upper respiratory specimens during the acute phase of infection. The lowest concentration of SARS-CoV-2 viral copies this assay can detect is 138 copies/mL. A negative result does not preclude SARS-Cov-2 infection and should not be used as the sole basis for treatment or other patient management decisions. A negative result may occur with  improper specimen collection/handling, submission of specimen other than nasopharyngeal swab, presence of viral mutation(s) within the areas targeted by this assay, and inadequate number of viral copies(<138 copies/mL). A negative result  must be combined with clinical observations, patient history, and epidemiological information. The expected result is Negative.  Fact Sheet for Patients:  EntrepreneurPulse.com.au  Fact Sheet for Healthcare Providers:  IncredibleEmployment.be  This test is no t yet approved or cleared by the [place] FDA and  has been authorized for detection and/or diagnosis of SARS-CoV-2 by FDA under an Emergency Use Authorization (EUA). This EUA will remain  in effect (meaning this test can be used) for the duration of the COVID-19 declaration under Section 564(b)(1) of the Act, 21 U.S.C.section 360bbb-3(b)(1), unless the authorization is terminated  or revoked sooner.       Influenza A by PCR NEGATIVE NEGATIVE Final   Influenza B by PCR NEGATIVE NEGATIVE Final    Comment: (NOTE) The Xpert Xpress SARS-CoV-2/FLU/RSV plus  assay is intended as an aid in the diagnosis of influenza from Nasopharyngeal swab specimens and should not be used as a sole basis for treatment. Nasal washings and aspirates are unacceptable for Xpert Xpress SARS-CoV-2/FLU/RSV testing.  Fact Sheet for Patients: EntrepreneurPulse.com.au  Fact Sheet for Healthcare Providers: IncredibleEmployment.be  This test is not yet approved or cleared by the [place] FDA and has been authorized for detection and/or diagnosis of SARS-CoV-2 by FDA under an Emergency Use Authorization (EUA). This EUA will remain in effect (meaning this test can be used) for the duration of the COVID-19 declaration under Section 564(b)(1) of the Act, 21 U.S.C. section 360bbb-3(b)(1), unless the authorization is terminated or revoked.  Performed at [facility], [address], [place], [place] [address]     Time coordinating discharge:  25 minutes.   SIGNED:  Ivor Costa, MD [facility] 07/18/2020, 3:23 PM   If 7PM-7AM, please contact night-coverage [email or web address]

## 2020-07-18 NOTE — Interval H&P Note (Signed)
History and Physical Interval Note:  07/18/2020 2:34 PM  Brian Banks  has presented today for surgery, with the diagnosis of Unstable angina.  The various methods of treatment have been discussed with the patient and family. After consideration of risks, benefits and other options for treatment, the patient has consented to  Procedure(s): LEFT HEART CATH AND CORONARY ANGIOGRAPHY (N/A) as a surgical intervention.  The patient's history has been reviewed, patient examined, no change in status, stable for surgery.  I have reviewed the patient's chart and labs.  Questions were answered to the patient's satisfaction.     [name]

## 2020-07-18 NOTE — Consult Note (Signed)
[address]       [place] [address]             [facility] Record #[id number] Date of Birth: 28-Aug-1945  Referring: No ref. provider found Primary Care: Adin Hector, MD Primary Cardiologist:Muhammad Fletcher Anon, MD  Chief Complaint:   No chief complaint on file. Chest pain  History of Present Illness:     75 year old man was in his usual state of health until this morning when he experienced dull chest pain and nausea.  This did not subside with rest.  He had had no prior episodes that were like this.  He does have a history of reflux and it was not reminiscent of that.  He presented to the emergency room in [place] where his EKG demonstrated right bundle branch block and PVCs.  He also had a mildly elevated high-sensitivity troponin.  He underwent left heart catheterization demonstrating severe proximal LAD disease and a left dominant coronary system.  There was distal branch disease as well.  He is transferred to [facility] for CABG.  He is chest pain-free now and hemodynamically stable.  He denies any history of strokes or dysrhythmias.   Current Activity/ Functional Status: Patient will be independent with mobility/ambulation, transfers, ADL's, IADL's.   Zubrod Score: At the time of surgery this patient's most appropriate activity status/level should be described as: []     0    Normal activity, no symptoms []     1    Restricted in physical strenuous activity but ambulatory, able to do out light work []     2    Ambulatory and capable of self care, unable to do work activities, up and about                 more than 50%  Of the time                            []     3    Only limited self care, in bed greater than 50% of waking hours []     4    Completely disabled, no self care, confined to bed or chair []     5    Moribund  Past Medical History:  Diagnosis Date  . Benign neoplasm of large bowel   .  BPH with obstruction/lower urinary tract symptoms   . Dysphagia   . Erectile dysfunction   . Heartburn   . History of kidney stones   . History of stomach ulcers   . HLD (hyperlipidemia)   . Hypogonadism in male   . Obesity   . Urinary frequency   . Urinary urgency   . Vitamin D deficiency     Past Surgical History:  Procedure Laterality Date  . APPENDECTOMY      Social History   Tobacco Use  Smoking Status Current Some Day Smoker  . Types: Cigars  Smokeless Tobacco Never Used    Social History   Substance and Sexual Activity  Alcohol Use Yes  . Alcohol/week: 0.0 standard drinks   Comment: Rare     Allergies  Allergen Reactions  . Androgel [Testosterone]   . Lovastatin     No current facility-administered medications for this encounter.    Medications Prior to Admission  Medication Sig Dispense Refill Last Dose  . finasteride (PROSCAR)  5 MG tablet Take 1 tablet (5 mg total) by mouth daily. (Patient taking differently: Take 5 mg by mouth daily with supper.) 90 tablet 3   . omeprazole (PRILOSEC) 20 MG capsule Take 20 mg by mouth daily with supper.       Family History  Problem Relation Age of Onset  . Alzheimer's disease Father   . Prostate cancer Brother   . Diabetes Mellitus II Mother   . Stroke Mother      Review of Systems:   ROS Pertinent items noted in HPI and remainder of comprehensive ROS otherwise negative.     Cardiac Review of Systems: Y or  [    ]= no  Chest Pain [    ]  Resting SOB [   ] Exertional SOB  [  ]  Orthopnea [  ]   Pedal Edema [   ]    Palpitations [  ] Syncope  [  ]   Presyncope [   ]  General Review of Systems: [Y] = yes [  ]=no Constitional: recent weight change [  ]; anorexia [  ]; fatigue [  ]; nausea [  ]; night sweats [  ]; fever [  ]; or chills [  ]                                                               Dental: Last Dentist visit:   Eye : blurred vision [  ]; diplopia [   ]; vision changes [  ];  Amaurosis  fugax[  ]; Resp: cough [  ];  wheezing[  ];  hemoptysis[  ]; shortness of breath[  ]; paroxysmal nocturnal dyspnea[  ]; dyspnea on exertion[  ]; or orthopnea[  ];  GI:  gallstones[  ], vomiting[  ];  dysphagia[  ]; melena[  ];  hematochezia [  ]; heartburn[  ];   Hx of  Colonoscopy[  ]; GU: kidney stones [  ]; hematuria[  ];   dysuria [  ];  nocturia[  ];  history of     obstruction [  ]; urinary frequency [  ]             Skin: rash, swelling[  ];, hair loss[  ];  peripheral edema[  ];  or itching[  ]; Musculosketetal: myalgias[  ];  joint swelling[  ];  joint erythema[  ];  joint pain[  ];  back pain[  ];  Heme/Lymph: bruising[  ];  bleeding[  ];  anemia[  ];  Neuro: TIA[  ];  headaches[  ];  stroke[  ];  vertigo[  ];  seizures[  ];   paresthesias[  ];  difficulty walking[  ];  Psych:depression[  ]; anxiety[  ];  Endocrine: diabetes[  ];  thyroid dysfunction[  ];         Physical Exam: Temp 97.7 F (36.5 C) (Oral)    General appearance: alert and cooperative Head: Normocephalic, without obvious abnormality, atraumatic Neck: no adenopathy, no carotid bruit, no JVD, supple, symmetrical, trachea midline and thyroid not enlarged, symmetric, no tenderness/mass/nodules Resp: clear to auscultation bilaterally Cardio: regular rate and rhythm, S1, S2 normal, no murmur, click, rub or gallop GI: soft, non-tender; bowel sounds normal; no masses,  no  organomegaly Extremities: extremities normal, atraumatic, no cyanosis or edema Neurologic: Alert and oriented X 3, normal strength and tone. Normal symmetric reflexes. Normal coordination and gait  Diagnostic Studies & Laboratory data:     Recent Radiology Findings:   DG Chest 2 View  Result Date: 07/18/2020 CLINICAL DATA:  Chest tightness with shortness of breath.  Smoker. EXAM: CHEST - 2 VIEW COMPARISON:  11/14/2011 CT FINDINGS: Mild hyperinflation. Numerous leads and wires project over the chest. Midline trachea. Normal heart size. Tortuous  thoracic aorta. No pleural effusion or pneumothorax. Mild pulmonary interstitial thickening. No lobar consolidation. IMPRESSION: 1. No acute cardiopulmonary disease. 2. Hyperinflation and interstitial thickening, likely related to smoking/chronic bronchitis. Electronically Signed   By: Abigail Miyamoto M.D.   On: 07/18/2020 10:32   CARDIAC CATHETERIZATION  Result Date: 07/18/2020  Ost LM to Dist LM lesion is 40% stenosed.  Dist LM to Ost LAD lesion is 95% stenosed.  Ost Cx to Prox Cx lesion is 20% stenosed.  Prox LAD lesion is 80% stenosed.  Prox LAD to Mid LAD lesion is 95% stenosed.  LPDA lesion is 70% stenosed.  3rd Mrg lesion is 50% stenosed.  The left ventricular systolic function is normal.  LV end diastolic pressure is mildly elevated.  The left ventricular ejection fraction is 55-65% by visual estimate.  1.  Left dominant coronary arteries with moderate left main disease, critical ostial LAD disease with 2 other lesions in the proximal segment and significant ostial left PDA disease.  The ostial/proximal LAD is moderately to severely calcified.  The takeoff from the left main does not allow stenting of the LAD without jailing the large dominant left circumflex. 2.  Normal LV systolic function and mildly elevated left ventricular end-diastolic pressure. Recommendations: The patient's LAD disease is not approachable percutaneously.  I think the best option is CABG. Transfer the patient to [facility]. Resume heparin at 9 PM. Obtain an echocardiogram.     I have independently reviewed the above radiologic studies and discussed with the patient   Recent Lab Findings: Lab Results  Component Value Date   WBC 6.0 07/18/2020   HGB 13.8 07/18/2020   HCT 39.8 07/18/2020   PLT 200 07/18/2020   GLUCOSE 171 (H) 07/18/2020   CHOL 179 07/18/2020   TRIG 95 07/18/2020   HDL 43 07/18/2020   LDLCALC 117 (H) 07/18/2020   ALT 20 07/18/2020   AST 20 07/18/2020   NA 139 07/18/2020   K 4.0  07/18/2020   CL 106 07/18/2020   CREATININE 0.91 07/18/2020   BUN 18 07/18/2020   CO2 23 07/18/2020   TSH 3.54 11/14/2011   INR 1.0 07/18/2020      Assessment / Plan:      75 year old man with acute coronary syndrome and evidence for three-vessel coronary artery disease not amenable to percutaneous intervention.  Agree with recommendation for CABG.  There appeared to be no obstacles to performing the operation tomorrow.  He is agreeable to this approach.  He has had the opportunity to ask questions which were answered to his satisfaction.  Plan CABG on 07/19/2020.    I  spent 30 minutes counseling the patient face to face.   Ocean Schildt Z. Orvan Seen, [place] 07/18/2020 6:31 PM

## 2020-07-18 NOTE — Consult Note (Signed)
[place] for Heparin > resume at 9 pm Indication: chest pain/ACS  Allergies  Allergen Reactions  . Androgel [Testosterone]   . Lovastatin     Patient Measurements:   Heparin Dosing Weight: 114.7 kg  Vital Signs: Temp: 97.7 F (36.5 C) (06/07 1755) Temp Source: Oral (06/07 1755) BP: 138/69 (06/07 1755) Pulse Rate: 59 (06/07 1755)  Labs: Recent Labs    07/18/20 1019 07/18/20 1134 07/18/20 1221  HGB 13.8  --   --   HCT 39.8  --   --   PLT 200  --   --   APTT  --  31  --   LABPROT  --  12.7  --   INR  --  1.0  --   HEPARINUNFRC  --   --  <0.10*  CREATININE 0.91  --   --   TROPONINIHS 20* 70*  --     Estimated Creatinine Clearance: 103.3 mL/min (by C-G formula based on SCr of 0.91 mg/dL).   Medical History: Past Medical History:  Diagnosis Date  . Benign neoplasm of large bowel   . BPH with obstruction/lower urinary tract symptoms   . Dysphagia   . Erectile dysfunction   . Heartburn   . History of kidney stones   . History of stomach ulcers   . HLD (hyperlipidemia)   . Hypogonadism in male   . Obesity   . Urinary frequency   . Urinary urgency   . Vitamin D deficiency     Medications:  Medications Prior to Admission  Medication Sig Dispense Refill Last Dose  . finasteride (PROSCAR) 5 MG tablet Take 1 tablet (5 mg total) by mouth daily. (Patient taking differently: Take 5 mg by mouth daily with supper.) 90 tablet 3   . omeprazole (PRILOSEC) 20 MG capsule Take 20 mg by mouth daily with supper.      Scheduled:  . [START ON 07/19/2020] aspirin EC  81 mg Oral Daily  . [START ON 07/19/2020] carvedilol  3.125 mg Oral BID WC  . rosuvastatin  20 mg Oral Daily   Infusions:  . heparin     PRN: acetaminophen, nitroGLYCERIN, ondansetron (ZOFRAN) IV Anti-infectives (From admission, onward)   None      Assessment: Pharmacy consulted to start heparin for ACS. No note of DOAC PTA. Trop 20. CBC stable.   Pt now s/p cath with  significant disease, planning CABG tomorrow.  Heparin was turned off for cath, pharmacy asked to resume at 9 pm.  Goal of Therapy:  Heparin level 0.3-0.7 units/ml Monitor platelets by anticoagulation protocol: Yes   Plan:  Resume IV heparin at 9 pm at previous rate of 1500 units/hr. Check heparin level 8 hrs after gtt starts. Heparin off for CABG in AM.  [place] Crane, Vena Austria, BCPS, [facility] Clinical Pharmacist  07/18/2020 7:10 PM   [facility] pharmacy phone numbers are listed on [email or web address]

## 2020-07-18 NOTE — Plan of Care (Signed)

## 2020-07-18 NOTE — H&P (Signed)
Cardiology Admission History and Physical:   Patient ID: Brian Banks MRN: [id number]; DOB: 1945/10/01   Admission date: (Not on file)  PCP:  Adin Hector, MD   [facility] Providers Cardiologist:  [name], MD   {   Chief Complaint: Transfer to [facility] for critical ostial and proximal LAD disease with need for formal evaluation for CABG  Patient Profile:   Brian Banks is a 75 y.o. male with h/o hyperlipidemia, GERD, BPH, and obesity who is being seen 07/18/2020 after transfer from [facility] for NSTEMI with subsequent cardiac catheterization showing critical ostial and proximal LAD disease with recommendation for transfer to [facility] for CTS evaluation / formal evaluation for CABG.  History of Present Illness:   Brian Banks is a 75 year old male with no previous known cardiac history.  Prior to this admission, he had known history of hyperlipidemia, BPH, kidney stones, dysphagia, GERD, and peptic ulcer.  He reported occasional cigar use.  On 07/18/2020, he was in his usual state of health when he woke that morning.  He had his coffee and breakfast without issue.  He was unloading some items from his truck and started to experience substernal chest pain, described as a feeling of tightness that radiated to his left arm.  Associated symptoms included diaphoresis, followed by nausea without emesis.  The chest pain then dissipated; however, shortly thereafter, it returned.  His symptoms continued on and off for approximately 2 hours.  When seen by cardiology in the emergency department, he was chest pain-free.  He denied previous similar symptoms.  In the emergency department, he was mildly hypertensive.  EKG showed NSR with right bundle branch block and PVCs.  Recommendation was for cardiac catheterization given his symptoms.    Subsequent troponins did show elevation, consistent with non-STEMI. Specifically, HS Tn 20  70.  Labs also showed stable  renal function and electrolytes with creatinine 0.91 and BUN 18, K4.0.  Hyperlipidemia with LDL 117 and total cholesterol 179.  Stable H&H with hemoglobin 13.8 and hematocrit 39.8.  COVID-19 negative.  He underwent cardiac catheterization same day with findings of critical ostial and proximal LAD disease with recommendation for transfer to [facility] for intervention.  Specifically, catheterization showed left dominant coronary arteries with moderate left main disease, critical ostial LAD disease with 2 other lesions in the proximal segment and significant ostial left PDA disease.  The ostial/proximal LAD was moderate to severely calcified.  The takeoff from the left main did not allow for stenting of the LAD without jailing the large dominant left circumflex.  Also noted with normal LVSF with mildly elevated LVEDP.  Given the patient's disease was not approachable percutaneously, it was recommended that he be transferred to [facility] for formal evaluation by CTS for CABG.  Per cath report findings, plan is to resume heparin at 9 PM.  Echo has been ordered on the [facility] side and should be obtained and this was coded not obtained before the patient's transfer.  Transfer has been discussed with the patient understanding and agreeable to this transfer.   [facility] has been notified of the transfer.   [facility] has been called and informed.      *[employer] [employer] orders placed and pended under his [facility] admission.  Unable to sign orders at this time, given the patient does not currently have an account number.  Please release these orders once patient has an account.    Past  Medical History:  Diagnosis Date  . Benign neoplasm of large bowel   . BPH with obstruction/lower urinary tract symptoms   . Dysphagia   . Erectile dysfunction   . Heartburn   . History of kidney stones   . History of stomach ulcers   . HLD (hyperlipidemia)   . Hypogonadism in male   . Obesity   . Urinary  frequency   . Urinary urgency   . Vitamin D deficiency     Past Surgical History:  Procedure Laterality Date  . APPENDECTOMY       Medications Prior to Admission: Prior to Admission medications   Medication Sig Start Date End Date Taking? Authorizing Provider  finasteride (PROSCAR) 5 MG tablet Take 1 tablet (5 mg total) by mouth daily. Patient taking differently: Take 5 mg by mouth daily with supper. 03/03/20   Zara Council A, PA-C  omeprazole (PRILOSEC) 20 MG capsule Take 20 mg by mouth daily with supper.    [provider]     Allergies:    Allergies  Allergen Reactions  . Androgel [Testosterone]   . Lovastatin     Social History:   Social History   Socioeconomic History  . Marital status: Single    Spouse name: Not on file  . Number of children: Not on file  . Years of education: Not on file  . Highest education level: Not on file  Occupational History  . Not on file  Tobacco Use  . Smoking status: Current Some Day Smoker    Types: Cigars  . Smokeless tobacco: Never Used  Vaping Use  . Vaping Use: Never used  Substance and Sexual Activity  . Alcohol use: Yes    Alcohol/week: 0.0 standard drinks    Comment: Rare  . Drug use: No  . Sexual activity: Not on file  Other Topics Concern  . Not on file  Social History Narrative  . Not on file   Social Determinants of Health   Financial Resource Strain: Not on file  Food Insecurity: Not on file  Transportation Needs: Not on file  Physical Activity: Not on file  Stress: Not on file  Social Connections: Not on file  Intimate Partner Violence: Not on file    Family History:  The patient's family history includes Alzheimer's disease in his father; Diabetes Mellitus II in his mother; Prostate cancer in his brother; Stroke in his mother.    ROS:  Please see the history of present illness.  All other ROS reviewed and negative as per HPI and attestation of MD.     Physical Exam/Data:  There were no  vitals filed for this visit. No intake or output data in the 24 hours ending 07/18/20 1632 Last 3 Weights 07/18/2020 03/19/2020 03/03/2020  Weight (lbs) 270 lb 285 lb 283 lb  Weight (kg) 122.471 kg 129.275 kg 128.368 kg     There is no height or weight on file to calculate BMI.  Deferred MD attestation   EKG:  The ECG that was done 07/18/2020 was personally reviewed and demonstrates 3.5 at arrival NSR, 65 bpm, right bundle branch block with QRS 155, QTc 464. Poor conduction via the inferior leads II, avF likely due to lead placement.    Relevant CV Studies: LHC 07/18/20  Ost LM to Dist LM lesion is 40% stenosed.  Dist LM to Ost LAD lesion is 95% stenosed.  Ost Cx to Prox Cx lesion is 20% stenosed.  Prox LAD lesion is 80% stenosed.  Prox LAD to Mid LAD lesion is 95% stenosed.  LPDA lesion is 70% stenosed.  3rd Mrg lesion is 50% stenosed.  The left ventricular systolic function is normal.  LV end diastolic pressure is mildly elevated.  The left ventricular ejection fraction is 55-65% by visual estimate. 1.  Left dominant coronary arteries with moderate left main disease, critical ostial LAD disease with 2 other lesions in the proximal segment and significant ostial left PDA disease.  The ostial/proximal LAD is moderately to severely calcified.  The takeoff from the left main does not allow stenting of the LAD without jailing the large dominant left circumflex. 2.  Normal LV systolic function and mildly elevated left ventricular end-diastolic pressure. Recommendations: The patient's LAD disease is not approachable percutaneously.  I think the best option is CABG. Transfer the patient to [facility]. Resume heparin at 9 PM. Obtain an echocardiogram.  ---------------------------------- Coronary Findings  Diagnostic Dominance: Left  Left Main  Ost LM to Dist LM lesion is 40% stenosed. The lesion is mildly calcified.  Dist LM to Ost LAD lesion is 95% stenosed. The lesion  is moderately calcified.  Left Anterior Descending  Prox LAD lesion is 80% stenosed.  Prox LAD to Mid LAD lesion is 95% stenosed.  Left Circumflex  Ost Cx to Prox Cx lesion is 20% stenosed.  Third Obtuse Marginal Branch  3rd Mrg lesion is 50% stenosed.  Left Posterior Descending Artery  LPDA lesion is 70% stenosed.  Right Coronary Artery  The vessel exhibits minimal luminal irregularities.   Intervention   No interventions have been documented.  Wall Motion  Resting                Left Heart  Left Ventricle The left ventricular size is normal. The left ventricular systolic function is normal. LV end diastolic pressure is mildly elevated. The left ventricular ejection fraction is 55-65% by visual estimate. No regional wall motion abnormalities.   Coronary Diagrams   Diagnostic Dominance: Left         Laboratory Data:  High Sensitivity Troponin:   Recent Labs  Lab 07/18/20 1019 07/18/20 1134  TROPONINIHS 20* 70*      Chemistry Recent Labs  Lab 07/18/20 1019  NA 139  K 4.0  CL 106  CO2 23  GLUCOSE 171*  BUN 18  CREATININE 0.91  CALCIUM 8.6*  GFRNONAA >60  ANIONGAP 10    Recent Labs  Lab 07/18/20 1019  PROT 6.6  ALBUMIN 4.0  AST 20  ALT 20  ALKPHOS 45  BILITOT 0.7   Hematology Recent Labs  Lab 07/18/20 1019  WBC 6.0  RBC 4.46  HGB 13.8  HCT 39.8  MCV 89.2  MCH 30.9  MCHC 34.7  RDW 12.7  PLT 200   BNPNo results for input(s): BNP, PROBNP in the last 168 hours.  DDimer No results for input(s): DDIMER in the last 168 hours.   Radiology/Studies:  DG Chest 2 View  Result Date: 07/18/2020 CLINICAL DATA:  Chest tightness with shortness of breath.  Smoker. EXAM: CHEST - 2 VIEW COMPARISON:  11/14/2011 CT FINDINGS: Mild hyperinflation. Numerous leads and wires project over the chest. Midline trachea. Normal heart size. Tortuous thoracic aorta. No pleural effusion or pneumothorax. Mild pulmonary interstitial thickening. No lobar  consolidation. IMPRESSION: 1. No acute cardiopulmonary disease. 2. Hyperinflation and interstitial thickening, likely related to smoking/chronic bronchitis. Electronically Signed   By: Abigail Miyamoto M.D.   On: 07/18/2020 10:32   CARDIAC CATHETERIZATION  Result  Date: 07/18/2020  Ost LM to Dist LM lesion is 40% stenosed.  Dist LM to Ost LAD lesion is 95% stenosed.  Ost Cx to Prox Cx lesion is 20% stenosed.  Prox LAD lesion is 80% stenosed.  Prox LAD to Mid LAD lesion is 95% stenosed.  LPDA lesion is 70% stenosed.  3rd Mrg lesion is 50% stenosed.  The left ventricular systolic function is normal.  LV end diastolic pressure is mildly elevated.  The left ventricular ejection fraction is 55-65% by visual estimate.  1.  Left dominant coronary arteries with moderate left main disease, critical ostial LAD disease with 2 other lesions in the proximal segment and significant ostial left PDA disease.  The ostial/proximal LAD is moderately to severely calcified.  The takeoff from the left main does not allow stenting of the LAD without jailing the large dominant left circumflex. 2.  Normal LV systolic function and mildly elevated left ventricular end-diastolic pressure. Recommendations: The patient's LAD disease is not approachable percutaneously.  I think the best option is CABG. Transfer the patient to [facility]. Resume heparin at 9 PM. Obtain an echocardiogram.     Assessment and Plan:   Non-STEMI Coronary artery disease/critical LAD disease  -- Presented 07/18/2020 with exertional chest pain. HS Tn 17  20.  Initial EKG without acute ST/T changes and showed right bundle branch block and PVC.  Same-day cardiac catheterization revealed critical ostial and proximal LAD disease with recommendation for transfer to [facility] for CTS evaluation for CABG.  EMTALA form completed. [facility] has been contacted and [facility] notified at [facility]  Patient is agreeable to transfer.  Echocardiogram has been  ordered at [facility] and should be obtained at [facility] if not performed prior to his transfer.    Obtain echo to assess EF, WM, structure/ valvular function.   Echo ordered on [facility] side.  If not obtained before transfer, echo should be obtained at [facility].   Restart IV heparin at approximately 9 PM on 07/18/2020  Daily CBC, BMET. Monitor INR.  Hgb 13.8 and HCT 39.8.  Cr 0.9 and BUN 18. K at goal.  Ordering A1C before transfer for further stratification.   N.p.o. pending timing of evaluation by CTS for CABG  If no plan for CABG today, recommend heart healthy diet for the remainder of 6/7 and n.p.o. after midnight on 6/8 at 0001 in preparation for possible CABG tomorrow.  Continue ASA 81 mg daily, Coreg 3.125mg  BID, statin. As above, restarrt of IV heparin at 9PM.  **Of note, [place] admission orders have been placed but unable to sign these orders due to no account number at this time and should be released once arrived at [facility].  HLD, LDL goal below 70 -- Most recent labs show LDL 117 and total cholesterol 179.  Recommend increasing to Crestor 40 mg daily.  Statin intolerance is listed, and if patient unable to tolerate statin, addition of Zetia +/- PCSK9 inhibitor should be considered.  Most recent LFTs WNL.  With medication changes to cholesterol regimen, repeat lipid and liver function in 6 to 8 weeks as an outpatient.  HTN, goal BP 130/80 or lower -- Most recent BP acceptable.  Continue to monitor and adjust antihypertensives if indicated.  For now, continue current carvedilol.  Given mildly elevated LVEDP on cath 6/7, continue to assess volume status with gentle IV diuresis if indicated.    *Orders placed and pended for [facility] admission.  Release once at [facility]  Risk Assessment/Risk Scores:    TIMI Risk Score for Unstable Angina or Non-ST Elevation MI:   The patient's TIMI risk score is 6, which indicates a 41% risk of all cause mortality, new or recurrent  myocardial infarction or need for urgent revascularization in the next 14 days.       Severity of Illness: The appropriate patient status for this patient is INPATIENT. Inpatient status is judged to be reasonable and necessary in order to provide the required intensity of service to ensure the patient's safety. The patient's presenting symptoms, physical exam findings, and initial radiographic and laboratory data in the context of their chronic comorbidities is felt to place them at high risk for further clinical deterioration. Furthermore, it is not anticipated that the patient will be medically stable for discharge from the hospital within 2 midnights of admission. The following factors support the patient status of inpatient.   " The patient's presenting symptoms include crit LAD dz. " The worrisome physical exam findings include crit LAD dz.. " The initial radiographic and laboratory data are worrisome because of crit LAD dz.. " The chronic co-morbidities include crit LAD dz..   * I certify that at the point of admission it is my clinical judgment that the patient will require inpatient hospital care spanning beyond 2 midnights from the point of admission due to high intensity of service, high risk for further deterioration and high frequency of surveillance required.*    For questions or updates, please contact [place] Please consult [email or web address] for contact info under     Signed, Arvil Chaco, PA-C  07/18/2020 4:32 PM

## 2020-07-18 NOTE — Consult Note (Signed)
[name] for Heparin  Indication: chest pain/ACS  Allergies  Allergen Reactions  . Androgel [Testosterone]   . Lovastatin     Patient Measurements: Height: 6\' 5"  (195.6 cm) Weight: 122.5 kg (270 lb) IBW/kg (Calculated) : 89.1 Heparin Dosing Weight: 114.7 kg  Vital Signs: Temp: 97.6 F (36.4 C) (06/07 1401) Temp Source: Oral (06/07 1401) BP: 150/72 (06/07 1518) Pulse Rate: 59 (06/07 1518)  Labs: Recent Labs    07/18/20 1019 07/18/20 1134 07/18/20 1221  HGB 13.8  --   --   HCT 39.8  --   --   PLT 200  --   --   APTT  --  31  --   LABPROT  --  12.7  --   INR  --  1.0  --   HEPARINUNFRC  --   --  <0.10*  CREATININE 0.91  --   --   TROPONINIHS 20* 70*  --     Estimated Creatinine Clearance: 103.3 mL/min (by C-G formula based on SCr of 0.91 mg/dL).   Medical History: Past Medical History:  Diagnosis Date  . Benign neoplasm of large bowel   . BPH with obstruction/lower urinary tract symptoms   . Dysphagia   . Erectile dysfunction   . Heartburn   . History of kidney stones   . History of stomach ulcers   . HLD (hyperlipidemia)   . Hypogonadism in male   . Obesity   . Urinary frequency   . Urinary urgency   . Vitamin D deficiency     Medications:  Medications Prior to Admission  Medication Sig Dispense Refill Last Dose  . finasteride (PROSCAR) 5 MG tablet Take 1 tablet (5 mg total) by mouth daily. (Patient taking differently: Take 5 mg by mouth daily with supper.) 90 tablet 3 07/17/2020 at 1800  . omeprazole (PRILOSEC) 20 MG capsule Take 20 mg by mouth daily with supper.   07/17/2020 at 1800   Scheduled:  . aspirin EC  325 mg Oral Daily  . carvedilol  3.125 mg Oral BID WC  . finasteride  5 mg Oral Q supper  . [MAR Hold] pantoprazole  40 mg Oral BID  . rosuvastatin  20 mg Oral Daily  . sodium chloride flush  3 mL Intravenous Q12H   Infusions:  . sodium chloride    . sodium chloride    . heparin     PRN: sodium  chloride, [MAR Hold] acetaminophen, acetaminophen, [MAR Hold] albuterol, [MAR Hold]  morphine injection, [MAR Hold] nitroGLYCERIN, [MAR Hold] ondansetron (ZOFRAN) IV, sodium chloride flush Anti-infectives (From admission, onward)   None      Assessment: Pharmacy consulted to start heparin for ACS. No note of DOAC PTA. Trop 20. CBC stable.   Goal of Therapy:  Heparin level 0.3-0.7 units/ml Monitor platelets by anticoagulation protocol: Yes   Plan:  Plan to restart heparin at 21:00 per cards. Order placed.  Continue heparin infusion at 1500 units/hr Check anti-Xa level in 8 hours and daily while on heparin Continue to monitor H&H and platelets  Oswald Hillock, PharmD  07/18/2020,3:30 PM

## 2020-07-18 NOTE — ED Provider Notes (Signed)
[facility] Emergency Department Provider Note  ____________________________________________   Event Date/Time   First MD Initiated Contact with Patient 07/18/20 0920     (approximate)  I have reviewed the triage vital signs and the nursing notes.   HISTORY  Chief Complaint Chest Pain   HPI PERLEY ARTHURS is a 75 y.o. male with a past medical history of HDL, BPH, kidney stones, dysphagia, GERD, peptic ulcer disease and steatosis who presents for assessment of some chest pain and nausea.  Patient states he felt normal this morning had pancakes and coffee for breakfast and went on to work and was just getting started to work 20 started feeling some chest pressure associate with nausea and feeling sweaty.  He states it is since eased off quite a bit and is currently 3/10 intensity.  No prior similar episodes.  No clear alleviating or aggravating other factors.  He denies any cardiac history.  He denies any recent headache, earache, sore throat, fevers, chills, cough, shortness of breath, vomiting, diarrhea, dysuria, abdominal pain, back pain, rash or extremity pain.  No recent falls or injuries.  He does not take any blood thinners.  He denies any history of tobacco abuse, significant EtOH use or illicit drug use.         Past Medical History:  Diagnosis Date  . Benign neoplasm of large bowel   . BPH with obstruction/lower urinary tract symptoms   . Dysphagia   . Erectile dysfunction   . Heartburn   . History of kidney stones   . History of stomach ulcers   . HLD (hyperlipidemia)   . Hypogonadism in male   . Obesity   . Urinary frequency   . Urinary urgency   . Vitamin D deficiency     Patient Active Problem List   Diagnosis Date Noted  . Hepatic steatosis 02/27/2018  . Morbidly obese ([place]) 02/06/2018  . GERD (gastroesophageal reflux disease) 04/15/2017  . Elevated TSH 01/30/2016  . Hyperglycemia, unspecified 01/30/2016  . Hyperlipidemia,  unspecified 12/19/2014  . BPH with obstruction/lower urinary tract symptoms 12/19/2014  . Benign fibroma of prostate 07/12/2013  . Acid indigestion 07/12/2013  . Hypotestosteronism 07/12/2013    Past Surgical History:  Procedure Laterality Date  . APPENDECTOMY      Prior to Admission medications   Medication Sig Start Date End Date Taking? Authorizing Provider  finasteride (PROSCAR) 5 MG tablet Take 1 tablet (5 mg total) by mouth daily. 03/03/20   Zara Council A, PA-C  Glucosamine-Chondroitin 750-600 MG TABS Take by mouth.    [provider]  omeprazole (PRILOSEC) 40 MG capsule Take by mouth. 09/21/14   [provider]    Allergies Androgel [testosterone] and Lovastatin  Family History  Problem Relation Age of Onset  . Alzheimer's disease Father   . Prostate cancer Brother   . Diabetes Mellitus II Mother   . Stroke Mother     Social History Social History   Tobacco Use  . Smoking status: Current Some Day Smoker    Types: Cigars  . Smokeless tobacco: Never Used  Vaping Use  . Vaping Use: Never used  Substance Use Topics  . Alcohol use: Yes    Alcohol/week: 0.0 standard drinks    Comment: Rare  . Drug use: No    Review of Systems  Review of Systems  Constitutional: Positive for diaphoresis and malaise/fatigue. Negative for chills and fever.  HENT: Negative for sore throat.   Eyes: Negative for pain.  Respiratory: Negative for cough and stridor.   Cardiovascular: Positive for chest pain.  Gastrointestinal: Positive for nausea. Negative for vomiting.  Genitourinary: Negative for dysuria.  Musculoskeletal: Negative for myalgias.  Skin: Negative for rash.  Neurological: Negative for seizures, loss of consciousness and headaches.  Psychiatric/Behavioral: Negative for suicidal ideas.  All other systems reviewed and are negative.     ____________________________________________   PHYSICAL EXAM:  VITAL SIGNS: ED Triage Vitals  Enc  Vitals Group     BP      Pulse      Resp      Temp      Temp src      SpO2      Weight      Height      Head Circumference      Peak Flow      Pain Score      Pain Loc      Pain Edu?      Excl. in [place]?    Vitals:   07/18/20 1008 07/18/20 1030  BP: 117/65 117/71  Pulse: 62 60  Resp: 17 17  Temp:    SpO2: 98% 99%   Physical Exam Vitals and nursing note reviewed.  Constitutional:      Appearance: He is well-developed.  HENT:     Head: Normocephalic and atraumatic.     Right Ear: External ear normal.     Left Ear: External ear normal.     Nose: Nose normal.  Eyes:     Conjunctiva/sclera: Conjunctivae normal.  Cardiovascular:     Rate and Rhythm: Normal rate and regular rhythm.     Heart sounds: No murmur heard.   Pulmonary:     Effort: Pulmonary effort is normal. No respiratory distress.     Breath sounds: Normal breath sounds.  Abdominal:     Palpations: Abdomen is soft.     Tenderness: There is no abdominal tenderness.  Musculoskeletal:     Cervical back: Neck supple.     Right lower leg: No edema.     Left lower leg: No edema.  Skin:    General: Skin is warm and dry.     Capillary Refill: Capillary refill takes less than 2 seconds.  Neurological:     Mental Status: He is alert and oriented to person, place, and time.  Psychiatric:        Mood and Affect: Mood normal.      ____________________________________________   LABS (all labs ordered are listed, but only abnormal results are displayed)  Labs Reviewed  COMPREHENSIVE METABOLIC PANEL - Abnormal; Notable for the following components:      Result Value   Glucose, Bld 171 (*)    Calcium 8.6 (*)    All other components within normal limits  TROPONIN I (HIGH SENSITIVITY) - Abnormal; Notable for the following components:   Troponin I (High Sensitivity) 20 (*)    All other components within normal limits  CBC WITH DIFFERENTIAL/PLATELET  CBC WITH DIFFERENTIAL/PLATELET  TROPONIN I (HIGH  SENSITIVITY)   ____________________________________________  EKG  Sinus rhythm with a ventricular rate of 65, sinus arrhythmia, PVC, , right bundle branch block and nonspecific ST changes in V3 without other clear evidence of acute ischemia or significant underlying arrhythmia. ____________________________________________  RADIOLOGY  ED MD interpretation: No focal consolidation, overt edema, thorax, effusion or other clear acute intrathoracic process.  Evidence of chronic bronchitis.  Official radiology report(s): DG Chest 2 View  Result Date: 07/18/2020 CLINICAL DATA:  Chest tightness with shortness of breath.  Smoker. EXAM: CHEST - 2 VIEW COMPARISON:  11/14/2011 CT FINDINGS: Mild hyperinflation. Numerous leads and wires project over the chest. Midline trachea. Normal heart size. Tortuous thoracic aorta. No pleural effusion or pneumothorax. Mild pulmonary interstitial thickening. No lobar consolidation. IMPRESSION: 1. No acute cardiopulmonary disease. 2. Hyperinflation and interstitial thickening, likely related to smoking/chronic bronchitis. Electronically Signed   By: Abigail Miyamoto M.D.   On: 07/18/2020 10:32    ____________________________________________   PROCEDURES  Procedure(s) performed (including Critical Care):  .Critical Care Performed by: Lucrezia Starch, MD Authorized by: Lucrezia Starch, MD   Critical care provider statement:    Critical care time (minutes):  45   Critical care time was exclusive of:  Separately billable procedures and treating other patients   Critical care was necessary to treat or prevent imminent or life-threatening deterioration of the following conditions:  Cardiac failure   Critical care was time spent personally by me on the following activities:  Discussions with consultants, evaluation of patient's response to treatment, examination of patient, ordering and performing treatments and interventions, ordering and review of laboratory studies,  ordering and review of radiographic studies, pulse oximetry, re-evaluation of patient's condition, obtaining history from patient or surrogate and review of old charts     ____________________________________________   INITIAL IMPRESSION / [place] / ED COURSE      Patient presents with above-stated history exam for assessment of chest tightness and shortness of breath associate with some nausea and diaphoresis.  On arrival he is afebrile and hemodynamically stable.  Differential includes ACS, PE, pneumonia, pneumothorax, dissection, pericarditis, colitis and PUD.  Overall low suspicion for PE at this time as she has no hypoxia, tachycardia, tachypnea and denies any shortness of breath and has no risk factors for this other than age.  He describes his chest pain is pressure-like has since eased off since he has been resting in bed denies any tearing ripping sensation or radiation to the back.  In addition he has symmetric upper extremity pulses and is not hypertensive.  Given pneumomediastinum on chest x-ray or low suspicion for dissection at this time.  Chest x-ray has no evidence of pneumonia, acute heart failure, large effusion, pneumothorax or any other clear acute intrathoracic process.  CBC is unremarkable.  CMP with a glucose of 171 without any other significant electrolyte or metabolic derangements.  Troponin is elevated at 20 consistent with mild NSTEMI.  Patient given ASA and start heparin.  Cardiology consulted.  I will admit to medicine service for further evaluation and management.     ____________________________________________   FINAL CLINICAL IMPRESSION(S) / ED DIAGNOSES  Final diagnoses:  NSTEMI (non-ST elevated myocardial infarction) ([place])    Medications  nitroGLYCERIN (NITROSTAT) SL tablet 0.4 mg (has no administration in time range)  aspirin chewable tablet 324 mg (324 mg Oral Given 07/18/20 1007)     ED Discharge Orders    None       Note:   This document was prepared using Dragon voice recognition software and may include unintentional dictation errors.   Lucrezia Starch, MD 07/18/20 1120

## 2020-07-18 NOTE — H&P (Addendum)
History and Physical    Brian Banks HCW:[id number] DOB: Aug 12, 1945 DOA: 07/18/2020  Referring MD/NP/PA:   PCP: Adin Hector, MD   Patient coming from:  The patient is coming from home.  At baseline, pt is independent for most of ADL.        Chief Complaint: chest pain  HPI: Brian Banks is a 75 y.o. male with medical history significant of hyperlipidemia, GERD, hypogonadism, stomach ulcers, kidney stone, BPH, chewing cigar (not smoking cigarette), who presents with chest pain.   Patient states that his chest pain started suddenly and this morning when he was unloading some stuff from the truck.  The chest pain is located in substernal area, pressure-like, initially 4 out of 10 severity, currently chest pain-free, radiating to the left arm, associated with minimal shortness of breath.  Patient does not have cough, fever or chills.  Patient had nausea, no vomiting, diarrhea or abdominal pain.  No symptoms of UTI.  Patient does not have recent fall or head injury.  No rectal bleeding or dark stool.  ED Course: pt was found to have troponin level 20 -->70, WBC 6.0, pending COVID PCR, electrolytes renal function okay, temperature normal, blood pressure 117/71, heart rate 60, RR 17, oxygen saturation 99% on room air.  Chest x-ray negative for infiltration, showed bronchitic change.  Patient is placed on progressive bed for observation.  Dr. Fletcher Anon of card is consulted.  Review of Systems:   General: no fevers, chills, no body weight gain, has fatigue HEENT: no blurry vision, hearing changes or sore throat Respiratory: has mild dyspnea, no coughing, wheezing CV: has chest pain, no palpitations GI: has nausea, no vomiting, abdominal pain, diarrhea, constipation GU: no dysuria, burning on urination, increased urinary frequency, hematuria  Ext: no leg edema Neuro: no unilateral weakness, numbness, or tingling, no vision change or hearing loss Skin: no rash, no skin tear. MSK: No  muscle spasm, no deformity, no limitation of range of movement in spin Heme: No easy bruising.  Travel history: No recent long distant travel.  Allergy:  Allergies  Allergen Reactions  . Androgel [Testosterone]   . Lovastatin     Past Medical History:  Diagnosis Date  . Benign neoplasm of large bowel   . BPH with obstruction/lower urinary tract symptoms   . Dysphagia   . Erectile dysfunction   . Heartburn   . History of kidney stones   . History of stomach ulcers   . HLD (hyperlipidemia)   . Hypogonadism in male   . Obesity   . Urinary frequency   . Urinary urgency   . Vitamin D deficiency     Past Surgical History:  Procedure Laterality Date  . APPENDECTOMY      Social History:  reports that he has been smoking cigars. He has never used smokeless tobacco. He reports current alcohol use. He reports that he does not use drugs.  Family History:  Family History  Problem Relation Age of Onset  . Alzheimer's disease Father   . Prostate cancer Brother   . Diabetes Mellitus II Mother   . Stroke Mother      Prior to Admission medications   Medication Sig Start Date End Date Taking? Authorizing Provider  finasteride (PROSCAR) 5 MG tablet Take 1 tablet (5 mg total) by mouth daily. 03/03/20   Zara Council A, PA-C  Glucosamine-Chondroitin 750-600 MG TABS Take by mouth.    [provider]  omeprazole (PRILOSEC) 40 MG capsule Take  by mouth. 09/21/14   [provider]    Physical Exam: Vitals:   07/18/20 0930 07/18/20 1008 07/18/20 1030 07/18/20 1115  BP: 138/78 117/65 117/71   Pulse: 60 62 60 (!) 58  Resp: 14 17 17 19   Temp:      TempSrc:      SpO2: 99% 98% 99% 98%  Weight:      Height:       General: Not in acute distress HEENT:       Eyes: PERRL, EOMI, no scleral icterus.       ENT: No discharge from the ears and nose, no pharynx injection, no tonsillar enlargement.        Neck: No JVD, no bruit, no mass felt. Heme: No neck lymph node  enlargement. Cardiac: S1/S2, RRR, No murmurs, No gallops or rubs. Respiratory: No rales, wheezing, rhonchi or rubs. GI: Soft, nondistended, nontender, no rebound pain, no organomegaly, BS present. GU: No hematuria Ext: No pitting leg edema bilaterally. 1+DP/PT pulse bilaterally. Musculoskeletal: No joint deformities, No joint redness or warmth, no limitation of ROM in spin. Skin: No rashes.  Neuro: Alert, oriented X3, cranial nerves II-XII grossly intact, moves all extremities normally.  Psych: Patient is not psychotic, no suicidal or hemocidal ideation.  Labs on Admission: I have personally reviewed following labs and imaging studies  CBC: Recent Labs  Lab 07/18/20 1019  WBC 6.0  NEUTROABS 4.4  HGB 13.8  HCT 39.8  MCV 89.2  PLT 700   Basic Metabolic Panel: Recent Labs  Lab 07/18/20 1019  NA 139  K 4.0  CL 106  CO2 23  GLUCOSE 171*  BUN 18  CREATININE 0.91  CALCIUM 8.6*   GFR: Estimated Creatinine Clearance: 103.3 mL/min (by C-G formula based on SCr of 0.91 mg/dL). Liver Function Tests: Recent Labs  Lab 07/18/20 1019  AST 20  ALT 20  ALKPHOS 45  BILITOT 0.7  PROT 6.6  ALBUMIN 4.0   No results for input(s): LIPASE, AMYLASE in the last 168 hours. No results for input(s): AMMONIA in the last 168 hours. Coagulation Profile: Recent Labs  Lab 07/18/20 1134  INR 1.0   Cardiac Enzymes: No results for input(s): CKTOTAL, CKMB, CKMBINDEX, TROPONINI in the last 168 hours. BNP (last 3 results) No results for input(s): PROBNP in the last 8760 hours. HbA1C: No results for input(s): HGBA1C in the last 72 hours. CBG: No results for input(s): GLUCAP in the last 168 hours. Lipid Profile: Recent Labs    07/18/20 1019  CHOL 179  HDL 43  LDLCALC 117*  TRIG 95  CHOLHDL 4.2   Thyroid Function Tests: No results for input(s): TSH, T4TOTAL, FREET4, T3FREE, THYROIDAB in the last 72 hours. Anemia Panel: No results for input(s): VITAMINB12, FOLATE, FERRITIN, TIBC,  IRON, RETICCTPCT in the last 72 hours. Urine analysis:    Component Value Date/Time   APPEARANCEUR Clear 03/26/2019 0847   GLUCOSEU Negative 03/26/2019 0847   BILIRUBINUR Negative 03/26/2019 0847   PROTEINUR Negative 03/26/2019 0847   NITRITE Negative 03/26/2019 0847   LEUKOCYTESUR Negative 03/26/2019 0847   Sepsis Labs: @LABRCNTIP (procalcitonin:4,lacticidven:4) ) Recent Results (from the past 240 hour(s))  Resp Panel by RT-PCR (Flu A&B, Covid) Nasopharyngeal Swab     Status: None   Collection Time: 07/18/20 11:57 AM   Specimen: Nasopharyngeal Swab; Nasopharyngeal(NP) swabs in vial transport medium  Result Value Ref Range Status   SARS Coronavirus 2 by RT PCR NEGATIVE NEGATIVE Final    Comment: (NOTE) SARS-CoV-2 target nucleic acids  are NOT DETECTED.  The SARS-CoV-2 RNA is generally detectable in upper respiratory specimens during the acute phase of infection. The lowest concentration of SARS-CoV-2 viral copies this assay can detect is 138 copies/mL. A negative result does not preclude SARS-Cov-2 infection and should not be used as the sole basis for treatment or other patient management decisions. A negative result may occur with  improper specimen collection/handling, submission of specimen other than nasopharyngeal swab, presence of viral mutation(s) within the areas targeted by this assay, and inadequate number of viral copies(<138 copies/mL). A negative result must be combined with clinical observations, patient history, and epidemiological information. The expected result is Negative.  Fact Sheet for Patients:  EntrepreneurPulse.com.au  Fact Sheet for Healthcare Providers:  IncredibleEmployment.be  This test is no t yet approved or cleared by the [place] FDA and  has been authorized for detection and/or diagnosis of SARS-CoV-2 by FDA under an Emergency Use Authorization (EUA). This EUA will remain  in effect (meaning this  test can be used) for the duration of the COVID-19 declaration under Section 564(b)(1) of the Act, 21 U.S.C.section 360bbb-3(b)(1), unless the authorization is terminated  or revoked sooner.       Influenza A by PCR NEGATIVE NEGATIVE Final   Influenza B by PCR NEGATIVE NEGATIVE Final    Comment: (NOTE) The Xpert Xpress SARS-CoV-2/FLU/RSV plus assay is intended as an aid in the diagnosis of influenza from Nasopharyngeal swab specimens and should not be used as a sole basis for treatment. Nasal washings and aspirates are unacceptable for Xpert Xpress SARS-CoV-2/FLU/RSV testing.  Fact Sheet for Patients: EntrepreneurPulse.com.au  Fact Sheet for Healthcare Providers: IncredibleEmployment.be  This test is not yet approved or cleared by the [place] FDA and has been authorized for detection and/or diagnosis of SARS-CoV-2 by FDA under an Emergency Use Authorization (EUA). This EUA will remain in effect (meaning this test can be used) for the duration of the COVID-19 declaration under Section 564(b)(1) of the Act, 21 U.S.C. section 360bbb-3(b)(1), unless the authorization is terminated or revoked.  Performed at [facility], [address], [place], [place] [address]      Radiological Exams on Admission: DG Chest 2 View  Result Date: 07/18/2020 CLINICAL DATA:  Chest tightness with shortness of breath.  Smoker. EXAM: CHEST - 2 VIEW COMPARISON:  11/14/2011 CT FINDINGS: Mild hyperinflation. Numerous leads and wires project over the chest. Midline trachea. Normal heart size. Tortuous thoracic aorta. No pleural effusion or pneumothorax. Mild pulmonary interstitial thickening. No lobar consolidation. IMPRESSION: 1. No acute cardiopulmonary disease. 2. Hyperinflation and interstitial thickening, likely related to smoking/chronic bronchitis. Electronically Signed   By: Abigail Miyamoto M.D.   On: 07/18/2020 10:32     EKG: I have personally  reviewed.  Sinus rhythm, QTC 464, PVC, right bundle blockade, early R wave progression  Assessment/Plan Principal Problem:   Chest pain Active Problems:   BPH with obstruction/lower urinary tract symptoms   GERD (gastroesophageal reflux disease)   HLD (hyperlipidemia)   Elevated troponin   Chest pain and elevated troponin: trop 20 --->70, indicating possible non-STEMI.  Dr. Fletcher Anon of card is consulted, planning to do cardiac catheterization today.  - place to progressive unit for observation - IV heparin - Trend Trop - Repeat EKG in the am  - prn Nitroglycerin, Morphine, and aspirin - pt is allergic to lovastatin, will f/u FLP. May need to start Zetia - Risk factor stratification: will check FLP and A1C   BPH with obstruction/lower urinary tract symptoms: -  Proscar  HLD (hyperlipidemia) -f/u FLP  GERD: Patient has history of gastric ulcer -Will increase protonix to 40 mg twice daily due to ASA use   DVT ppx: on IV Heparin    Code Status: Full code Family Communication:    Yes, patient's son by phone Disposition Plan:  Anticipate discharge back to previous environment Consults called:  Dr. Fletcher Anon of card Admission status and Level of care: Progressive Cardiac:    for obs   Status is: Observation  The patient remains OBS appropriate and will d/c before 2 midnights.  Dispo: The patient is from: Home              Anticipated d/c is to: Home              Patient currently is not medically stable to d/c.   Difficult to place patient No          Date of Service 07/18/2020    Ivor Costa [facility]   If 7PM-7AM, please contact night-coverage [email or web address] 07/18/2020, 1:30 PM

## 2020-07-19 ENCOUNTER — Inpatient Hospital Stay (HOSPITAL_COMMUNITY): Payer: PPO

## 2020-07-19 ENCOUNTER — Inpatient Hospital Stay: Admit: 2020-07-19 | Payer: PPO | Admitting: Cardiothoracic Surgery

## 2020-07-19 ENCOUNTER — Other Ambulatory Visit (HOSPITAL_COMMUNITY): Payer: PPO

## 2020-07-19 ENCOUNTER — Inpatient Hospital Stay (HOSPITAL_COMMUNITY)
Admission: AD | Disposition: A | Discharge status: Home / Self Care | Payer: Self-pay | Source: Other Acute Inpatient Hospital | Attending: Cardiothoracic Surgery

## 2020-07-19 ENCOUNTER — Inpatient Hospital Stay (HOSPITAL_COMMUNITY): Payer: PPO | Admitting: Anesthesiology

## 2020-07-19 ENCOUNTER — Encounter: Payer: Self-pay | Admitting: Cardiovascular Disease

## 2020-07-19 DIAGNOSIS — I2510 Atherosclerotic heart disease of native coronary artery without angina pectoris: Secondary | ICD-10-CM | POA: Diagnosis present

## 2020-07-19 DIAGNOSIS — I200 Unstable angina: Secondary | ICD-10-CM

## 2020-07-19 DIAGNOSIS — Z951 Presence of aortocoronary bypass graft: Secondary | ICD-10-CM

## 2020-07-19 DIAGNOSIS — I214 Non-ST elevation (NSTEMI) myocardial infarction: Secondary | ICD-10-CM

## 2020-07-19 HISTORY — DX: Presence of aortocoronary bypass graft: Z95.1

## 2020-07-19 HISTORY — PX: ENDOVEIN HARVEST OF GREATER SAPHENOUS VEIN: SHX5059

## 2020-07-19 HISTORY — PX: CORONARY ARTERY BYPASS GRAFT: SHX141

## 2020-07-19 HISTORY — PX: TEE WITHOUT CARDIOVERSION: SHX5443

## 2020-07-19 LAB — CBC
HCT: 43.6 % (ref 39.0–52.0)
HCT: 34.8 % — ABNORMAL LOW (ref 39.0–52.0)
HCT: 28.9 % — ABNORMAL LOW (ref 39.0–52.0)
Hemoglobin: 14.7 g/dL (ref 13.0–17.0)
Hemoglobin: 11.6 g/dL — ABNORMAL LOW (ref 13.0–17.0)
Hemoglobin: 9.8 g/dL — ABNORMAL LOW (ref 13.0–17.0)
MCH: 30.5 pg (ref 26.0–34.0)
MCH: 30.6 pg (ref 26.0–34.0)
MCH: 31.1 pg (ref 26.0–34.0)
MCHC: 33.7 g/dL (ref 30.0–36.0)
MCHC: 33.3 g/dL (ref 30.0–36.0)
MCHC: 33.9 g/dL (ref 30.0–36.0)
MCV: 90.5 fL (ref 80.0–100.0)
MCV: 91.8 fL (ref 80.0–100.0)
MCV: 91.7 fL (ref 80.0–100.0)
Platelets: 229 10*3/uL (ref 150–400)
Platelets: 186 10*3/uL (ref 150–400)
Platelets: 175 10*3/uL (ref 150–400)
RBC: 4.82 MIL/uL (ref 4.22–5.81)
RBC: 3.79 MIL/uL — ABNORMAL LOW (ref 4.22–5.81)
RBC: 3.15 MIL/uL — ABNORMAL LOW (ref 4.22–5.81)
RDW: 12.7 % (ref 11.5–15.5)
RDW: 12.7 % (ref 11.5–15.5)
RDW: 12.8 % (ref 11.5–15.5)
WBC: 8.4 10*3/uL (ref 4.0–10.5)
WBC: 16.2 10*3/uL — ABNORMAL HIGH (ref 4.0–10.5)
WBC: 12.1 10*3/uL — ABNORMAL HIGH (ref 4.0–10.5)
nRBC: 0 % (ref 0.0–0.2)
nRBC: 0 % (ref 0.0–0.2)
nRBC: 0 % (ref 0.0–0.2)

## 2020-07-19 LAB — POCT I-STAT, CHEM 8
BUN: 11 mg/dL (ref 8–23)
BUN: 11 mg/dL (ref 8–23)
BUN: 11 mg/dL (ref 8–23)
BUN: 12 mg/dL (ref 8–23)
Calcium, Ion: 1.2 mmol/L (ref 1.15–1.40)
Calcium, Ion: 1.24 mmol/L (ref 1.15–1.40)
Calcium, Ion: 1.06 mmol/L — ABNORMAL LOW (ref 1.15–1.40)
Calcium, Ion: 1.24 mmol/L (ref 1.15–1.40)
Chloride: 102 mmol/L (ref 98–111)
Chloride: 103 mmol/L (ref 98–111)
Chloride: 102 mmol/L (ref 98–111)
Chloride: 102 mmol/L (ref 98–111)
Creatinine, Ser: 0.7 mg/dL (ref 0.61–1.24)
Creatinine, Ser: 0.8 mg/dL (ref 0.61–1.24)
Creatinine, Ser: 0.7 mg/dL (ref 0.61–1.24)
Creatinine, Ser: 0.7 mg/dL (ref 0.61–1.24)
Glucose, Bld: 118 mg/dL — ABNORMAL HIGH (ref 70–99)
Glucose, Bld: 125 mg/dL — ABNORMAL HIGH (ref 70–99)
Glucose, Bld: 135 mg/dL — ABNORMAL HIGH (ref 70–99)
Glucose, Bld: 173 mg/dL — ABNORMAL HIGH (ref 70–99)
HCT: 37 % — ABNORMAL LOW (ref 39.0–52.0)
HCT: 34 % — ABNORMAL LOW (ref 39.0–52.0)
HCT: 28 % — ABNORMAL LOW (ref 39.0–52.0)
HCT: 31 % — ABNORMAL LOW (ref 39.0–52.0)
Hemoglobin: 12.6 g/dL — ABNORMAL LOW (ref 13.0–17.0)
Hemoglobin: 11.6 g/dL — ABNORMAL LOW (ref 13.0–17.0)
Hemoglobin: 9.5 g/dL — ABNORMAL LOW (ref 13.0–17.0)
Hemoglobin: 10.5 g/dL — ABNORMAL LOW (ref 13.0–17.0)
Potassium: 4 mmol/L (ref 3.5–5.1)
Potassium: 4.2 mmol/L (ref 3.5–5.1)
Potassium: 5 mmol/L (ref 3.5–5.1)
Potassium: 4.9 mmol/L (ref 3.5–5.1)
Sodium: 139 mmol/L (ref 135–145)
Sodium: 138 mmol/L (ref 135–145)
Sodium: 137 mmol/L (ref 135–145)
Sodium: 137 mmol/L (ref 135–145)
TCO2: 28 mmol/L (ref 22–32)
TCO2: 28 mmol/L (ref 22–32)
TCO2: 26 mmol/L (ref 22–32)
TCO2: 24 mmol/L (ref 22–32)

## 2020-07-19 LAB — POCT I-STAT 7, (LYTES, BLD GAS, ICA,H+H)
Acid-Base Excess: 1 mmol/L (ref 0.0–2.0)
Acid-Base Excess: 3 mmol/L — ABNORMAL HIGH (ref 0.0–2.0)
Acid-base deficit: 1 mmol/L (ref 0.0–2.0)
Acid-base deficit: 2 mmol/L (ref 0.0–2.0)
Acid-base deficit: 3 mmol/L — ABNORMAL HIGH (ref 0.0–2.0)
Acid-base deficit: 2 mmol/L (ref 0.0–2.0)
Bicarbonate: 27.5 mmol/L (ref 20.0–28.0)
Bicarbonate: 24.5 mmol/L (ref 20.0–28.0)
Bicarbonate: 26.6 mmol/L (ref 20.0–28.0)
Bicarbonate: 23.3 mmol/L (ref 20.0–28.0)
Bicarbonate: 22 mmol/L (ref 20.0–28.0)
Bicarbonate: 22.4 mmol/L (ref 20.0–28.0)
Calcium, Ion: 1.07 mmol/L — ABNORMAL LOW (ref 1.15–1.40)
Calcium, Ion: 1.07 mmol/L — ABNORMAL LOW (ref 1.15–1.40)
Calcium, Ion: 1.25 mmol/L (ref 1.15–1.40)
Calcium, Ion: 1.24 mmol/L (ref 1.15–1.40)
Calcium, Ion: 1.19 mmol/L (ref 1.15–1.40)
Calcium, Ion: 1.19 mmol/L (ref 1.15–1.40)
HCT: 31 % — ABNORMAL LOW (ref 39.0–52.0)
HCT: 29 % — ABNORMAL LOW (ref 39.0–52.0)
HCT: 32 % — ABNORMAL LOW (ref 39.0–52.0)
HCT: 34 % — ABNORMAL LOW (ref 39.0–52.0)
HCT: 28 % — ABNORMAL LOW (ref 39.0–52.0)
HCT: 26 % — ABNORMAL LOW (ref 39.0–52.0)
Hemoglobin: 10.5 g/dL — ABNORMAL LOW (ref 13.0–17.0)
Hemoglobin: 10.9 g/dL — ABNORMAL LOW (ref 13.0–17.0)
Hemoglobin: 9.9 g/dL — ABNORMAL LOW (ref 13.0–17.0)
Hemoglobin: 11.6 g/dL — ABNORMAL LOW (ref 13.0–17.0)
Hemoglobin: 9.5 g/dL — ABNORMAL LOW (ref 13.0–17.0)
Hemoglobin: 8.8 g/dL — ABNORMAL LOW (ref 13.0–17.0)
O2 Saturation: 100 %
O2 Saturation: 100 %
O2 Saturation: 92 %
O2 Saturation: 98 %
O2 Saturation: 96 %
O2 Saturation: 93 %
Patient temperature: 36.1
Patient temperature: 37
Patient temperature: 37.2
Potassium: 4.5 mmol/L (ref 3.5–5.1)
Potassium: 4.9 mmol/L (ref 3.5–5.1)
Potassium: 5.2 mmol/L — ABNORMAL HIGH (ref 3.5–5.1)
Potassium: 4.4 mmol/L (ref 3.5–5.1)
Potassium: 4.2 mmol/L (ref 3.5–5.1)
Potassium: 4.1 mmol/L (ref 3.5–5.1)
Sodium: 139 mmol/L (ref 135–145)
Sodium: 137 mmol/L (ref 135–145)
Sodium: 138 mmol/L (ref 135–145)
Sodium: 139 mmol/L (ref 135–145)
Sodium: 139 mmol/L (ref 135–145)
Sodium: 139 mmol/L (ref 135–145)
TCO2: 29 mmol/L (ref 22–32)
TCO2: 26 mmol/L (ref 22–32)
TCO2: 28 mmol/L (ref 22–32)
TCO2: 25 mmol/L (ref 22–32)
TCO2: 23 mmol/L (ref 22–32)
TCO2: 24 mmol/L (ref 22–32)
pCO2 arterial: 50 mmHg — ABNORMAL HIGH (ref 32.0–48.0)
pCO2 arterial: 35.1 mmHg (ref 32.0–48.0)
pCO2 arterial: 43 mmHg (ref 32.0–48.0)
pCO2 arterial: 41.1 mmHg (ref 32.0–48.0)
pCO2 arterial: 39 mmHg (ref 32.0–48.0)
pCO2 arterial: 38.1 mmHg (ref 32.0–48.0)
pH, Arterial: 7.348 — ABNORMAL LOW (ref 7.350–7.450)
pH, Arterial: 7.364 (ref 7.350–7.450)
pH, Arterial: 7.488 — ABNORMAL HIGH (ref 7.350–7.450)
pH, Arterial: 7.356 (ref 7.350–7.450)
pH, Arterial: 7.361 (ref 7.350–7.450)
pH, Arterial: 7.378 (ref 7.350–7.450)
pO2, Arterial: 412 mmHg — ABNORMAL HIGH (ref 83.0–108.0)
pO2, Arterial: 323 mmHg — ABNORMAL HIGH (ref 83.0–108.0)
pO2, Arterial: 67 mmHg — ABNORMAL LOW (ref 83.0–108.0)
pO2, Arterial: 112 mmHg — ABNORMAL HIGH (ref 83.0–108.0)
pO2, Arterial: 87 mmHg (ref 83.0–108.0)
pO2, Arterial: 70 mmHg — ABNORMAL LOW (ref 83.0–108.0)

## 2020-07-19 LAB — POCT I-STAT EG7
Acid-Base Excess: 0 mmol/L (ref 0.0–2.0)
Bicarbonate: 26.8 mmol/L (ref 20.0–28.0)
Calcium, Ion: 1.1 mmol/L — ABNORMAL LOW (ref 1.15–1.40)
HCT: 32 % — ABNORMAL LOW (ref 39.0–52.0)
Hemoglobin: 10.9 g/dL — ABNORMAL LOW (ref 13.0–17.0)
O2 Saturation: 83 %
Potassium: 4.5 mmol/L (ref 3.5–5.1)
Sodium: 140 mmol/L (ref 135–145)
TCO2: 28 mmol/L (ref 22–32)
pCO2, Ven: 52.1 mmHg (ref 44.0–60.0)
pH, Ven: 7.319 (ref 7.250–7.430)
pO2, Ven: 52 mmHg — ABNORMAL HIGH (ref 32.0–45.0)

## 2020-07-19 LAB — HEMOGLOBIN AND HEMATOCRIT, BLOOD
HCT: 29.7 % — ABNORMAL LOW (ref 39.0–52.0)
Hemoglobin: 10.2 g/dL — ABNORMAL LOW (ref 13.0–17.0)

## 2020-07-19 LAB — HEMOGLOBIN A1C
Hgb A1c MFr Bld: 5.9 % — ABNORMAL HIGH (ref 4.8–5.6)
Hgb A1c MFr Bld: 5.9 % — ABNORMAL HIGH (ref 4.8–5.6)
Mean Plasma Glucose: 123 mg/dL
Mean Plasma Glucose: 123 mg/dL

## 2020-07-19 LAB — BASIC METABOLIC PANEL
Anion gap: 8 (ref 5–15)
Anion gap: 8 (ref 5–15)
BUN: 12 mg/dL (ref 8–23)
BUN: 12 mg/dL (ref 8–23)
CO2: 24 mmol/L (ref 22–32)
CO2: 24 mmol/L (ref 22–32)
Calcium: 8.8 mg/dL — ABNORMAL LOW (ref 8.9–10.3)
Calcium: 8 mg/dL — ABNORMAL LOW (ref 8.9–10.3)
Chloride: 106 mmol/L (ref 98–111)
Chloride: 105 mmol/L (ref 98–111)
Creatinine, Ser: 0.87 mg/dL (ref 0.61–1.24)
Creatinine, Ser: 0.92 mg/dL (ref 0.61–1.24)
GFR, Estimated: >60 mL/min (ref >60)
GFR, Estimated: >60 mL/min (ref >60)
Glucose, Bld: 114 mg/dL — ABNORMAL HIGH (ref 70–99)
Glucose, Bld: 161 mg/dL — ABNORMAL HIGH (ref 70–99)
Potassium: 4 mmol/L (ref 3.5–5.1)
Potassium: 4.1 mmol/L (ref 3.5–5.1)
Sodium: 138 mmol/L (ref 135–145)
Sodium: 137 mmol/L (ref 135–145)

## 2020-07-19 LAB — PROTIME-INR
INR: 1 (ref 0.8–1.2)
INR: 1.3 — ABNORMAL HIGH (ref 0.8–1.2)
Prothrombin Time: 13.5 seconds (ref 11.4–15.2)
Prothrombin Time: 15.7 seconds — ABNORMAL HIGH (ref 11.4–15.2)

## 2020-07-19 LAB — PLATELET COUNT: Platelets: 190 10*3/uL (ref 150–400)

## 2020-07-19 LAB — SURGICAL PCR SCREEN
MRSA, PCR: NEGATIVE
Staphylococcus aureus: NEGATIVE

## 2020-07-19 LAB — GLUCOSE, CAPILLARY
Glucose-Capillary: 141 mg/dL — ABNORMAL HIGH (ref 70–99)
Glucose-Capillary: 149 mg/dL — ABNORMAL HIGH (ref 70–99)
Glucose-Capillary: 152 mg/dL — ABNORMAL HIGH (ref 70–99)

## 2020-07-19 LAB — MAGNESIUM: Magnesium: 2.7 mg/dL — ABNORMAL HIGH (ref 1.7–2.4)

## 2020-07-19 LAB — APTT: aPTT: 30 seconds (ref 24–36)

## 2020-07-19 SURGERY — CORONARY ARTERY BYPASS GRAFTING (CABG)
Anesthesia: General | Site: Chest | Laterality: Right

## 2020-07-19 MED ORDER — PROTAMINE SULFATE 10 MG/ML IV SOLN
INTRAVENOUS | Status: AC
  Filled 2020-07-19: qty 5

## 2020-07-19 MED ORDER — LACTATED RINGERS IV SOLN: INTRAVENOUS | Status: DC

## 2020-07-19 MED ORDER — PROTAMINE SULFATE 10 MG/ML IV SOLN
INTRAVENOUS | Status: AC
  Filled 2020-07-19: qty 25

## 2020-07-19 MED ORDER — SODIUM CHLORIDE (PF) 0.9 % IJ SOLN
INTRAMUSCULAR | Status: AC
  Filled 2020-07-19: qty 10

## 2020-07-19 MED ORDER — LIDOCAINE 2% (20 MG/ML) 5 ML SYRINGE
INTRAMUSCULAR | Status: DC | PRN
  Administered 2020-07-19 (×2): 100 mg via INTRAVENOUS

## 2020-07-19 MED ORDER — SODIUM CHLORIDE 0.9 % IV SOLN: INTRAVENOUS | Status: DC | PRN

## 2020-07-19 MED ORDER — FENTANYL CITRATE (PF) 250 MCG/5ML IJ SOLN
INTRAMUSCULAR | Status: AC
  Filled 2020-07-19: qty 25

## 2020-07-19 MED ORDER — PROPOFOL 10 MG/ML IV BOLUS
INTRAVENOUS | Status: AC
  Filled 2020-07-19: qty 20

## 2020-07-19 MED ORDER — CEFAZOLIN SODIUM-DEXTROSE 2-4 GM/100ML-% IV SOLN
2.0000 g | Freq: Three times a day (TID) | INTRAVENOUS | Status: AC
  Administered 2020-07-19 – 2020-07-21 (×6): 2 g via INTRAVENOUS
  Filled 2020-07-19 (×6): qty 100

## 2020-07-19 MED ORDER — PANTOPRAZOLE SODIUM 40 MG PO TBEC
40.0000 mg | DELAYED_RELEASE_TABLET | Freq: Every day | ORAL | Status: DC
  Administered 2020-07-21 – 2020-07-24 (×4): 40 mg via ORAL
  Filled 2020-07-19 (×4): qty 1

## 2020-07-19 MED ORDER — POTASSIUM CHLORIDE 10 MEQ/50ML IV SOLN: 10.0000 meq | INTRAVENOUS | Status: AC

## 2020-07-19 MED ORDER — AMIODARONE HCL IN DEXTROSE 360-4.14 MG/200ML-% IV SOLN
30.0000 mg/h | INTRAVENOUS | Status: DC
  Administered 2020-07-19 – 2020-07-21 (×4): 30 mg/h via INTRAVENOUS
  Filled 2020-07-19 (×4): qty 200

## 2020-07-19 MED ORDER — ACETAMINOPHEN 500 MG PO TABS
1000.0000 mg | ORAL_TABLET | Freq: Four times a day (QID) | ORAL | Status: DC
  Administered 2020-07-19 – 2020-07-24 (×17): 1000 mg via ORAL
  Filled 2020-07-19 (×15): qty 2

## 2020-07-19 MED ORDER — METOPROLOL TARTRATE 5 MG/5ML IV SOLN: 2.5000 mg | INTRAVENOUS | Status: DC | PRN

## 2020-07-19 MED ORDER — VANCOMYCIN HCL 1000 MG IV SOLR
INTRAVENOUS | Status: AC
  Filled 2020-07-19: qty 3000

## 2020-07-19 MED ORDER — ALBUMIN HUMAN 5 % IV SOLN: INTRAVENOUS | Status: DC | PRN

## 2020-07-19 MED ORDER — LACTATED RINGERS IV SOLN: 500.0000 mL | Freq: Once | INTRAVENOUS | Status: DC | PRN

## 2020-07-19 MED ORDER — HEMOSTATIC AGENTS (NO CHARGE) OPTIME
TOPICAL | Status: DC | PRN
  Administered 2020-07-19 (×5): 1 via TOPICAL

## 2020-07-19 MED ORDER — FINASTERIDE 5 MG PO TABS
5.0000 mg | ORAL_TABLET | Freq: Every day | ORAL | Status: DC
  Administered 2020-07-20 – 2020-07-23 (×4): 5 mg via ORAL
  Filled 2020-07-19 (×5): qty 1

## 2020-07-19 MED ORDER — CHLORHEXIDINE GLUCONATE CLOTH 2 % EX PADS
6.0000 | MEDICATED_PAD | Freq: Every day | CUTANEOUS | Status: DC
  Administered 2020-07-19 – 2020-07-24 (×4): 6 via TOPICAL

## 2020-07-19 MED ORDER — ACETAMINOPHEN 160 MG/5ML PO SOLN: 1000.0000 mg | Freq: Four times a day (QID) | ORAL | Status: DC

## 2020-07-19 MED ORDER — MORPHINE SULFATE (PF) 2 MG/ML IV SOLN
1.0000 mg | INTRAVENOUS | Status: DC | PRN
  Administered 2020-07-19 – 2020-07-23 (×4): 2 mg via INTRAVENOUS
  Filled 2020-07-19 (×4): qty 1

## 2020-07-19 MED ORDER — PHENYLEPHRINE HCL-NACL 20-0.9 MG/250ML-% IV SOLN: 0.0000 ug/min | INTRAVENOUS | Status: DC

## 2020-07-19 MED ORDER — METOPROLOL TARTRATE 12.5 MG HALF TABLET
12.5000 mg | ORAL_TABLET | Freq: Two times a day (BID) | ORAL | Status: DC
  Administered 2020-07-21 – 2020-07-24 (×7): 12.5 mg via ORAL
  Filled 2020-07-19 (×9): qty 1

## 2020-07-19 MED ORDER — SODIUM CHLORIDE 0.9% FLUSH: 3.0000 mL | INTRAVENOUS | Status: DC | PRN

## 2020-07-19 MED ORDER — OXYCODONE HCL 5 MG PO TABS
5.0000 mg | ORAL_TABLET | ORAL | Status: DC | PRN
  Administered 2020-07-19 – 2020-07-23 (×6): 5 mg via ORAL
  Filled 2020-07-19 (×6): qty 1

## 2020-07-19 MED ORDER — HEPARIN SODIUM (PORCINE) 1000 UNIT/ML IJ SOLN
INTRAMUSCULAR | Status: DC | PRN
  Administered 2020-07-19: 37000 [IU] via INTRAVENOUS

## 2020-07-19 MED ORDER — ROCURONIUM BROMIDE 10 MG/ML (PF) SYRINGE
PREFILLED_SYRINGE | INTRAVENOUS | Status: AC
  Filled 2020-07-19: qty 20

## 2020-07-19 MED ORDER — PROPOFOL 10 MG/ML IV BOLUS
INTRAVENOUS | Status: DC | PRN
  Administered 2020-07-19: 100 mg via INTRAVENOUS
  Administered 2020-07-19: 50 mg via INTRAVENOUS

## 2020-07-19 MED ORDER — PROTAMINE SULFATE 10 MG/ML IV SOLN
INTRAVENOUS | Status: DC | PRN
  Administered 2020-07-19: 350 mg via INTRAVENOUS

## 2020-07-19 MED ORDER — PLASMA-LYTE A IV SOLN: INTRAVENOUS | Status: DC

## 2020-07-19 MED ORDER — KETOROLAC TROMETHAMINE 15 MG/ML IJ SOLN
7.5000 mg | Freq: Four times a day (QID) | INTRAMUSCULAR | Status: AC
  Administered 2020-07-19 – 2020-07-20 (×4): 7.5 mg via INTRAVENOUS
  Filled 2020-07-19 (×4): qty 1

## 2020-07-19 MED ORDER — LIDOCAINE 2% (20 MG/ML) 5 ML SYRINGE
INTRAMUSCULAR | Status: AC
  Filled 2020-07-19: qty 5

## 2020-07-19 MED ORDER — DEXTROSE 50 % IV SOLN: 0.0000 mL | INTRAVENOUS | Status: DC | PRN

## 2020-07-19 MED ORDER — DEXMEDETOMIDINE HCL IN NACL 400 MCG/100ML IV SOLN
0.0000 ug/kg/h | INTRAVENOUS | Status: DC
  Administered 2020-07-19: 0.5 ug/kg/h via INTRAVENOUS
  Filled 2020-07-19: qty 100

## 2020-07-19 MED ORDER — LACTATED RINGERS IV SOLN: INTRAVENOUS | Status: DC | PRN

## 2020-07-19 MED ORDER — MIDAZOLAM HCL 5 MG/5ML IJ SOLN
INTRAMUSCULAR | Status: DC | PRN
  Administered 2020-07-19: 2 mg via INTRAVENOUS
  Administered 2020-07-19 (×2): 1 mg via INTRAVENOUS
  Administered 2020-07-19: 4 mg via INTRAVENOUS
  Administered 2020-07-19: 2 mg via INTRAVENOUS

## 2020-07-19 MED ORDER — ACETAMINOPHEN 650 MG RE SUPP
650.0000 mg | Freq: Once | RECTAL | Status: AC
Start: 2020-07-19 — End: 2020-07-19
  Administered 2020-07-19: 650 mg via RECTAL

## 2020-07-19 MED ORDER — CHLORHEXIDINE GLUCONATE 0.12 % MT SOLN
15.0000 mL | OROMUCOSAL | Status: AC
  Administered 2020-07-19: 15 mL via OROMUCOSAL

## 2020-07-19 MED ORDER — PLATELET POOR PLASMA OPTIME
Status: DC | PRN
  Administered 2020-07-19: 10 mL

## 2020-07-19 MED ORDER — STERILE WATER FOR INJECTION IJ SOLN
INTRAMUSCULAR | Status: DC | PRN
  Administered 2020-07-19: 10 mL

## 2020-07-19 MED ORDER — SODIUM CHLORIDE 0.9 % IV SOLN: INTRAVENOUS | Status: DC

## 2020-07-19 MED ORDER — BUPIVACAINE LIPOSOME 1.3 % IJ SUSP
INTRAMUSCULAR | Status: AC
  Filled 2020-07-19: qty 20

## 2020-07-19 MED ORDER — SODIUM CHLORIDE 0.9 % IV SOLN: 250.0000 mL | INTRAVENOUS | Status: DC

## 2020-07-19 MED ORDER — ROCURONIUM BROMIDE 10 MG/ML (PF) SYRINGE
PREFILLED_SYRINGE | INTRAVENOUS | Status: DC | PRN
  Administered 2020-07-19: 40 mg via INTRAVENOUS
  Administered 2020-07-19: 100 mg via INTRAVENOUS
  Administered 2020-07-19: 60 mg via INTRAVENOUS

## 2020-07-19 MED ORDER — MIDAZOLAM HCL (PF) 10 MG/2ML IJ SOLN
INTRAMUSCULAR | Status: AC
  Filled 2020-07-19: qty 2

## 2020-07-19 MED ORDER — BISACODYL 5 MG PO TBEC
10.0000 mg | DELAYED_RELEASE_TABLET | Freq: Every day | ORAL | Status: DC
  Administered 2020-07-20 – 2020-07-24 (×5): 10 mg via ORAL
  Filled 2020-07-19 (×5): qty 2

## 2020-07-19 MED ORDER — THROMBIN 5000 UNITS EX SOLR
INTRAVENOUS | Status: DC | PRN
  Administered 2020-07-19: 2 mL

## 2020-07-19 MED ORDER — METOPROLOL TARTRATE 25 MG/10 ML ORAL SUSPENSION
12.5000 mg | Freq: Two times a day (BID) | ORAL | Status: DC
  Filled 2020-07-19 (×6): qty 5

## 2020-07-19 MED ORDER — ASPIRIN EC 325 MG PO TBEC
325.0000 mg | DELAYED_RELEASE_TABLET | Freq: Every day | ORAL | Status: DC
  Administered 2020-07-20 – 2020-07-23 (×4): 325 mg via ORAL
  Filled 2020-07-19 (×4): qty 1

## 2020-07-19 MED ORDER — LEVALBUTEROL TARTRATE 45 MCG/ACT IN AERO: 2.0000 | INHALATION_SPRAY | Freq: Four times a day (QID) | RESPIRATORY_TRACT | Status: DC

## 2020-07-19 MED ORDER — BUPIVACAINE LIPOSOME 1.3 % IJ SUSP
INTRAMUSCULAR | Status: DC | PRN
  Administered 2020-07-19: 30 mL

## 2020-07-19 MED ORDER — TRAMADOL HCL 50 MG PO TABS
50.0000 mg | ORAL_TABLET | ORAL | Status: DC | PRN
Start: 2020-07-19 — End: 2020-07-24
  Administered 2020-07-20 (×2): 50 mg via ORAL
  Filled 2020-07-19 (×2): qty 1

## 2020-07-19 MED ORDER — INSULIN REGULAR(HUMAN) IN NACL 100-0.9 UT/100ML-% IV SOLN
INTRAVENOUS | Status: DC
  Administered 2020-07-20: 1.2 [IU]/h via INTRAVENOUS
  Filled 2020-07-19 (×3): qty 100

## 2020-07-19 MED ORDER — VANCOMYCIN HCL IN DEXTROSE 1-5 GM/200ML-% IV SOLN
1000.0000 mg | Freq: Once | INTRAVENOUS | Status: AC
  Administered 2020-07-19: 1000 mg via INTRAVENOUS
  Filled 2020-07-19: qty 200

## 2020-07-19 MED ORDER — ACETAMINOPHEN 160 MG/5ML PO SOLN: 650.0000 mg | Freq: Once | ORAL | Status: AC

## 2020-07-19 MED ORDER — ONDANSETRON HCL 4 MG/2ML IJ SOLN: 4.0000 mg | Freq: Four times a day (QID) | INTRAMUSCULAR | Status: DC | PRN

## 2020-07-19 MED ORDER — SODIUM CHLORIDE 0.45 % IV SOLN: INTRAVENOUS | Status: DC | PRN

## 2020-07-19 MED ORDER — BUPIVACAINE HCL (PF) 0.5 % IJ SOLN
INTRAMUSCULAR | Status: AC
  Filled 2020-07-19: qty 30

## 2020-07-19 MED ORDER — PHENYLEPHRINE 40 MCG/ML (10ML) SYRINGE FOR IV PUSH (FOR BLOOD PRESSURE SUPPORT)
PREFILLED_SYRINGE | INTRAVENOUS | Status: DC | PRN
Start: 2020-07-19 — End: 2020-07-19
  Administered 2020-07-19: 80 ug via INTRAVENOUS
  Administered 2020-07-19: 120 ug via INTRAVENOUS

## 2020-07-19 MED ORDER — AMIODARONE HCL IN DEXTROSE 360-4.14 MG/200ML-% IV SOLN: 60.0000 mg/h | INTRAVENOUS | Status: DC

## 2020-07-19 MED ORDER — ALBUMIN HUMAN 5 % IV SOLN
250.0000 mL | INTRAVENOUS | Status: AC | PRN
  Administered 2020-07-19 (×4): 12.5 g via INTRAVENOUS
  Filled 2020-07-19 (×2): qty 250

## 2020-07-19 MED ORDER — PLASMA-LYTE A IV SOLN
INTRAVENOUS | Status: DC | PRN
  Administered 2020-07-19: 1000 mL via INTRAVASCULAR

## 2020-07-19 MED ORDER — NITROGLYCERIN IN D5W 200-5 MCG/ML-% IV SOLN: 0.0000 ug/min | INTRAVENOUS | Status: DC

## 2020-07-19 MED ORDER — SODIUM CHLORIDE 0.9 % IV SOLN
20.0000 ug | Freq: Once | INTRAVENOUS | Status: AC
  Administered 2020-07-19: 20 ug via INTRAVENOUS
  Filled 2020-07-19: qty 5

## 2020-07-19 MED ORDER — NOREPINEPHRINE 4 MG/250ML-% IV SOLN
0.0000 ug/min | INTRAVENOUS | Status: DC
  Administered 2020-07-19: 8 ug/min via INTRAVENOUS
  Administered 2020-07-20: 6 ug/min via INTRAVENOUS
  Filled 2020-07-19 (×2): qty 250

## 2020-07-19 MED ORDER — PHENYLEPHRINE 40 MCG/ML (10ML) SYRINGE FOR IV PUSH (FOR BLOOD PRESSURE SUPPORT)
PREFILLED_SYRINGE | INTRAVENOUS | Status: AC
  Filled 2020-07-19: qty 10

## 2020-07-19 MED ORDER — BISACODYL 10 MG RE SUPP: 10.0000 mg | Freq: Every day | RECTAL | Status: DC

## 2020-07-19 MED ORDER — HEPARIN SODIUM (PORCINE) 1000 UNIT/ML IJ SOLN
INTRAMUSCULAR | Status: AC
  Filled 2020-07-19: qty 1

## 2020-07-19 MED ORDER — PLATELET RICH PLASMA OPTIME
Status: DC | PRN
  Administered 2020-07-19: 10 mL

## 2020-07-19 MED ORDER — FAMOTIDINE IN NACL 20-0.9 MG/50ML-% IV SOLN
20.0000 mg | Freq: Two times a day (BID) | INTRAVENOUS | Status: AC
  Administered 2020-07-19: 20 mg via INTRAVENOUS
  Filled 2020-07-19 (×2): qty 50

## 2020-07-19 MED ORDER — TRANEXAMIC ACID 1000 MG/10ML IV SOLN
1.5000 mg/kg/h | INTRAVENOUS | Status: DC
  Filled 2020-07-19: qty 25

## 2020-07-19 MED ORDER — STERILE WATER FOR INJECTION IJ SOLN
INTRAMUSCULAR | Status: AC
  Filled 2020-07-19: qty 10

## 2020-07-19 MED ORDER — MIDAZOLAM HCL 2 MG/2ML IJ SOLN
2.0000 mg | INTRAMUSCULAR | Status: DC | PRN
  Administered 2020-07-19 (×2): 2 mg via INTRAVENOUS
  Filled 2020-07-19 (×2): qty 2

## 2020-07-19 MED ORDER — SODIUM CHLORIDE 0.9% FLUSH: 3.0000 mL | Freq: Two times a day (BID) | INTRAVENOUS | Status: DC

## 2020-07-19 MED ORDER — MAGNESIUM SULFATE 4 GM/100ML IV SOLN
4.0000 g | Freq: Once | INTRAVENOUS | Status: AC
  Administered 2020-07-19: 4 g via INTRAVENOUS
  Filled 2020-07-19: qty 100

## 2020-07-19 MED ORDER — SODIUM CHLORIDE 0.9% IV SOLUTION: Freq: Once | INTRAVENOUS | Status: DC

## 2020-07-19 MED ORDER — VANCOMYCIN HCL 1000 MG IV SOLR
INTRAVENOUS | Status: DC | PRN
  Administered 2020-07-19 (×3): 1000 mg

## 2020-07-19 MED ORDER — LEVALBUTEROL HCL 0.63 MG/3ML IN NEBU
0.6300 mg | INHALATION_SOLUTION | Freq: Four times a day (QID) | RESPIRATORY_TRACT | Status: DC
  Administered 2020-07-19 – 2020-07-20 (×5): 0.63 mg via RESPIRATORY_TRACT
  Filled 2020-07-19 (×6): qty 3

## 2020-07-19 MED ORDER — AMIODARONE HCL IN DEXTROSE 360-4.14 MG/200ML-% IV SOLN
INTRAVENOUS | Status: DC | PRN
  Administered 2020-07-19: 30 mg/h via INTRAVENOUS

## 2020-07-19 MED ORDER — 0.9 % SODIUM CHLORIDE (POUR BTL) OPTIME
TOPICAL | Status: DC | PRN
  Administered 2020-07-19: 5000 mL

## 2020-07-19 MED ORDER — DOCUSATE SODIUM 100 MG PO CAPS
200.0000 mg | ORAL_CAPSULE | Freq: Every day | ORAL | Status: DC
  Administered 2020-07-20 – 2020-07-24 (×5): 200 mg via ORAL
  Filled 2020-07-19 (×5): qty 2

## 2020-07-19 MED ORDER — PHENYLEPHRINE HCL-NACL 20-0.9 MG/250ML-% IV SOLN
INTRAVENOUS | Status: DC | PRN
  Administered 2020-07-19: 25 ug/min via INTRAVENOUS

## 2020-07-19 MED ORDER — FENTANYL CITRATE (PF) 250 MCG/5ML IJ SOLN
INTRAMUSCULAR | Status: DC | PRN
  Administered 2020-07-19: 50 ug via INTRAVENOUS
  Administered 2020-07-19: 200 ug via INTRAVENOUS
  Administered 2020-07-19: 150 ug via INTRAVENOUS
  Administered 2020-07-19 (×2): 100 ug via INTRAVENOUS
  Administered 2020-07-19 (×2): 50 ug via INTRAVENOUS
  Administered 2020-07-19: 150 ug via INTRAVENOUS

## 2020-07-19 MED ORDER — ASPIRIN 81 MG PO CHEW
324.0000 mg | CHEWABLE_TABLET | Freq: Every day | ORAL | Status: DC
  Filled 2020-07-19 (×2): qty 4

## 2020-07-19 SURGICAL SUPPLY — 98 items
ADAPTER CARDIO PERF ANTE/RETRO (ADAPTER) ×4 IMPLANT
ADH SKN CLS APL DERMABOND .7 (GAUZE/BANDAGES/DRESSINGS) ×3
ADPR PRFSN 84XANTGRD RTRGD (ADAPTER) ×3
APL SRG 7X2 LUM MLBL SLNT (VASCULAR PRODUCTS)
APPLICATOR TIP COSEAL (VASCULAR PRODUCTS) IMPLANT
BAG DECANTER FOR FLEXI CONT (MISCELLANEOUS) ×4 IMPLANT
BLADE CLIPPER SURG (BLADE) ×4 IMPLANT
BLADE STERNUM SYSTEM 6 (BLADE) ×4 IMPLANT
BLADE SURG 11 STRL SS (BLADE) ×1 IMPLANT
BNDG ELASTIC 4X5.8 VLCR STR LF (GAUZE/BANDAGES/DRESSINGS) ×4 IMPLANT
BNDG ELASTIC 6X5.8 VLCR STR LF (GAUZE/BANDAGES/DRESSINGS) ×4 IMPLANT
BNDG GAUZE ELAST 4 BULKY (GAUZE/BANDAGES/DRESSINGS) ×4 IMPLANT
CANISTER SUCT 3000ML PPV (MISCELLANEOUS) ×4 IMPLANT
CANNULA NON VENT 22FR 12 (CANNULA) ×1 IMPLANT
CATH CPB KIT HENDRICKSON (MISCELLANEOUS) ×4 IMPLANT
CATH ROBINSON RED A/P 18FR (CATHETERS) ×8 IMPLANT
CLIP RETRACTION 3.0MM CORONARY (MISCELLANEOUS) ×4 IMPLANT
CLIP VESOCCLUDE MED 24/CT (CLIP) ×1 IMPLANT
CLIP VESOCCLUDE SM WIDE 24/CT (CLIP) ×9 IMPLANT
CONTAINER PROTECT SURGISLUSH (MISCELLANEOUS) ×4 IMPLANT
DERMABOND ADVANCED (GAUZE/BANDAGES/DRESSINGS) ×1
DERMABOND ADVANCED .7 DNX12 (GAUZE/BANDAGES/DRESSINGS) ×3 IMPLANT
DRAIN CHANNEL 28F RND 3/8 FF (WOUND CARE) ×12 IMPLANT
DRAPE CARDIOVASCULAR INCISE (DRAPES) ×4
DRAPE SPACE STATION 30X60X34 (DRAPES) ×1 IMPLANT
DRAPE SRG 135X102X78XABS (DRAPES) ×3 IMPLANT
DRAPE WARM FLUID 44X44 (DRAPES) ×4 IMPLANT
DRSG AQUACEL AG ADV 3.5X14 (GAUZE/BANDAGES/DRESSINGS) ×4 IMPLANT
ELECT CAUTERY BLADE 6.4 (BLADE) ×4 IMPLANT
ELECT REM PT RETURN 9FT ADLT (ELECTROSURGICAL) ×8
ELECTRODE REM PT RTRN 9FT ADLT (ELECTROSURGICAL) ×6 IMPLANT
FELT TEFLON 1X6 (MISCELLANEOUS) ×8 IMPLANT
GAUZE SPONGE 4X4 12PLY STRL (GAUZE/BANDAGES/DRESSINGS) ×8 IMPLANT
GLOVE NEODERM STRL 7.5  LF PF (GLOVE) ×9
GLOVE NEODERM STRL 7.5 LF PF (GLOVE) ×9 IMPLANT
GLOVE SURG NEODERM 7.5  LF PF (GLOVE) ×3
GOWN STRL REUS W/ TWL LRG LVL3 (GOWN DISPOSABLE) ×12 IMPLANT
GOWN STRL REUS W/TWL LRG LVL3 (GOWN DISPOSABLE) ×32
HEMOSTAT POWDER SURGIFOAM 1G (HEMOSTASIS) ×10 IMPLANT
INSERT FOGARTY XLG (MISCELLANEOUS) ×4 IMPLANT
INSERT SUTURE HOLDER (MISCELLANEOUS) ×4 IMPLANT
KIT APPLICATOR RATIO 11:1 (KITS) ×1 IMPLANT
KIT BASIN OR (CUSTOM PROCEDURE TRAY) ×4 IMPLANT
KIT SUCTION CATH 14FR (SUCTIONS) ×4 IMPLANT
KIT TURNOVER KIT B (KITS) ×4 IMPLANT
KIT VASOVIEW HEMOPRO 2 VH 4000 (KITS) ×4 IMPLANT
MARKER GRAFT CORONARY BYPASS (MISCELLANEOUS) ×12 IMPLANT
NDL 18GX1X1/2 (RX/OR ONLY) (NEEDLE) ×3 IMPLANT
NEEDLE 18GX1X1/2 (RX/OR ONLY) (NEEDLE) ×4 IMPLANT
NS IRRIG 1000ML POUR BTL (IV SOLUTION) ×20 IMPLANT
PACK E OPEN HEART (SUTURE) ×4 IMPLANT
PACK OPEN HEART (CUSTOM PROCEDURE TRAY) ×4 IMPLANT
PACK PLATELET PROCEDURE 60 (MISCELLANEOUS) ×1 IMPLANT
PACK SPY-PHI (KITS) ×1 IMPLANT
PAD ARMBOARD 7.5X6 YLW CONV (MISCELLANEOUS) ×8 IMPLANT
PAD ELECT DEFIB RADIOL ZOLL (MISCELLANEOUS) ×4 IMPLANT
PENCIL BUTTON HOLSTER BLD 10FT (ELECTRODE) ×4 IMPLANT
POSITIONER HEAD DONUT 9IN (MISCELLANEOUS) ×4 IMPLANT
POWDER SURGICEL 3.0 GRAM (HEMOSTASIS) ×5 IMPLANT
PUNCH AORTIC ROTATE 5MM 8IN (MISCELLANEOUS) ×1 IMPLANT
SEALANT SURG COSEAL 4ML (VASCULAR PRODUCTS) ×4 IMPLANT
SEALANT SURG COSEAL 8ML (VASCULAR PRODUCTS) IMPLANT
SET MPS 3-ND DEL (MISCELLANEOUS) ×1 IMPLANT
SPONGE LAP 18X18 RF (DISPOSABLE) ×1 IMPLANT
SUT BONE WAX W31G (SUTURE) ×4 IMPLANT
SUT MNCRL AB 3-0 PS2 18 (SUTURE) ×8 IMPLANT
SUT MNCRL AB 4-0 PS2 18 (SUTURE) ×1 IMPLANT
SUT PDS AB 1 CTX 36 (SUTURE) ×8 IMPLANT
SUT PROLENE 3 0 SH DA (SUTURE) ×4 IMPLANT
SUT PROLENE 5 0 C 1 36 (SUTURE) IMPLANT
SUT PROLENE 6 0 C 1 30 (SUTURE) ×12 IMPLANT
SUT PROLENE 7 0 BV 1 (SUTURE) ×4 IMPLANT
SUT PROLENE 8 0 BV175 6 (SUTURE) IMPLANT
SUT PROLENE BLUE 7 0 (SUTURE) ×4 IMPLANT
SUT SILK  1 MH (SUTURE) ×4
SUT SILK 1 MH (SUTURE) IMPLANT
SUT SILK 2 0 SH CR/8 (SUTURE) IMPLANT
SUT SILK 3 0 SH CR/8 (SUTURE) IMPLANT
SUT STEEL 6MS V (SUTURE) ×4 IMPLANT
SUT STEEL SZ 6 DBL 3X14 BALL (SUTURE) ×4 IMPLANT
SUT VIC AB 2-0 CT1 27 (SUTURE) ×4
SUT VIC AB 2-0 CT1 TAPERPNT 27 (SUTURE) IMPLANT
SUT VIC AB 2-0 CTX 27 (SUTURE) IMPLANT
SUT VIC AB 3-0 X1 27 (SUTURE) IMPLANT
SYR 10ML LL (SYRINGE) IMPLANT
SYR 30ML LL (SYRINGE) ×4 IMPLANT
SYR 3ML LL SCALE MARK (SYRINGE) ×4 IMPLANT
SYSTEM SAHARA CHEST DRAIN ATS (WOUND CARE) ×4 IMPLANT
TAPE CLOTH SURG 4X10 WHT LF (GAUZE/BANDAGES/DRESSINGS) ×1 IMPLANT
TAPE PAPER 2X10 WHT MICROPORE (GAUZE/BANDAGES/DRESSINGS) ×1 IMPLANT
TIP DUAL SPRAY TOPICAL (TIP) ×2 IMPLANT
TOWEL GREEN STERILE (TOWEL DISPOSABLE) ×4 IMPLANT
TOWEL GREEN STERILE FF (TOWEL DISPOSABLE) ×4 IMPLANT
TRAY FOLEY SLVR 16FR TEMP STAT (SET/KITS/TRAYS/PACK) ×4 IMPLANT
TUBING LAP HI FLOW INSUFFLATIO (TUBING) ×4 IMPLANT
UNDERPAD 30X36 HEAVY ABSORB (UNDERPADS AND DIAPERS) ×4 IMPLANT
WATER STERILE IRR 1000ML POUR (IV SOLUTION) ×8 IMPLANT
WATER STERILE IRR 1000ML UROMA (IV SOLUTION) IMPLANT

## 2020-07-19 NOTE — Anesthesia Procedure Notes (Signed)
Procedure Name: Intubation Date/Time: 07/19/2020 8:55 AM Performed by: Kyung Rudd, CRNA Pre-anesthesia Checklist: Patient identified, Emergency Drugs available, Suction available and Patient being monitored Patient Re-evaluated:Patient Re-evaluated prior to induction Oxygen Delivery Method: Circle system utilized Preoxygenation: Pre-oxygenation with 100% oxygen Induction Type: IV induction Ventilation: Mask ventilation without difficulty and Oral airway inserted - appropriate to patient size Laryngoscope Size: Mac and 4 Grade View: Grade I Tube type: Oral Tube size: 8.0 mm Number of attempts: 1 Airway Equipment and Method: Stylet and Oral airway Placement Confirmation: ETT inserted through vocal cords under direct vision,  positive ETCO2 and breath sounds checked- equal and bilateral Secured at: 23 cm Tube secured with: Tape Dental Injury: Teeth and Oropharynx as per pre-operative assessment

## 2020-07-19 NOTE — Procedures (Signed)
Extubation Procedure Note  Patient Details:   Name: Brian Banks DOB: 03-Nov-1945 MRN: [id number]   Airway Documentation:    Vent end date: 07/19/20 Vent end time: 1910   Evaluation  O2 sats: stable throughout Complications: No apparent complications Patient did tolerate procedure well. Bilateral Breath Sounds: Clear   Yes  [name] 07/19/2020, 7:16 PM

## 2020-07-19 NOTE — Hospital Course (Addendum)
HPI: This is a 75 year old man was in his usual state of health until this morning when he experienced dull chest pain and nausea.  This did not subside with rest.  He had had no prior episodes that were like this.  He does have a history of reflux and it was not reminiscent of that.  He presented to the emergency room in [place] where his EKG demonstrated right bundle branch block and PVCs.  He also had a mildly elevated high-sensitivity troponin.  He underwent left heart catheterization demonstrating severe proximal LAD disease and a left dominant coronary system.  There was distal branch disease as well.  He is transferred to [facility] for CABG.  He is chest pain-free now and hemodynamically stable.  He denies any history of strokes or dysrhythmias. Dr. Orvan Seen discussed the need for coronary artery bypass grafting surgery. Potential risks, benefits, and complications of the surgery were discussed with the patient and he agreed to proceed with surgery. Pre op operative carotid duplex US showed no significant internal carotid artery stenosis bilaterally. Patient underwent a CABG x on 07/19/2020.  Hospital Course: Patient was extubated the evening of surgery. He remained afebrile and hemodynamically stable. He was weaned off Neo Synephrine and Nitro drips. He remained on an Amiodarone drip.  This was initiated due to frequent PVCs in the operating room.  He was initially AAI paced.  He has transitioned to a sinus rhythm with a intermittent bundle branch block with occasional PVCs.  He was transitioned to oral Amiodarone on 06/10. Gordy Councilman, a line, chest tubes, and foley were removed early in his post operative course. He was later started on Lopressor. He was volume overloaded and diuresed accordingly. He was weaned off the Insulin drip. His pre op HGA1C is 5.9. He will need further surveillance by his medical doctor of his [facility] after discharge. He had expected post op blood loss anemia. He did  not require a post op transfusion. His H and H was 8.7 and 24.7 on 06/10. He was started on Trinsicon. He was started on Lopressor on 06/10.  He will be on DAPT therapy at time of discharge.  Aspirin will be 81 mg daily and Plavix will be 75 mg daily.  Chest tubes were removed on 07/23/2020 as were epicardial pacing leads.  Renal function has remained within normal limits.  Incisions are noted to be healing well without evidence of infection.  He is tolerating gradually increasing activities using standard cardiac rehab protocols.

## 2020-07-19 NOTE — Progress Notes (Signed)
EVENING ROUNDS NOTE :     [address]       [place] [address]             [phone]                 Day of Surgery Procedure(s) (LRB): CORONARY ARTERY BYPASS GRAFTING (CABG) TIMES THREE ON PUMP USING LEFT INTERNAL MAMMARY ARTERY AND ENDOSCOPICALLY HARVESTED RIGHT GREATER SAPHENOUS VEIN (N/A) TRANSESOPHAGEAL ECHOCARDIOGRAM (TEE) (N/A) INDOCYANINE GREEN FLUORESCENCE IMAGING (ICG) (N/A) ENDOVEIN HARVEST OF GREATER SAPHENOUS VEIN (Right)   Total Length of Stay:  LOS: 1 day  Events:   855mls of CT output 70 in the last hr Weaning to extubate    BP (!) 162/64   Pulse 92   Temp 98.6 F (37 C)   Resp 20   Ht 6\' 5"  (1.956 m)   Wt 123.8 kg   SpO2 99%   BMI 32.37 kg/m   PAP: (14-26)/(2-16) 20/10 CO:  [4 L/min] 4 L/min CI:  [1.6 L/min/m2] 1.6 L/min/m2  Vent Mode: PSV;CPAP FiO2 (%):  [40 %-50 %] 40 % Set Rate:  [4 bmp-12 bmp] 4 bmp Vt Set:  [710 mL] 710 mL PEEP:  [5 cmH20] 5 cmH20 Pressure Support:  [10 cmH20] 10 cmH20 Plateau Pressure:  [19 cmH20] 19 cmH20  . sodium chloride Stopped (07/19/20 1748)  . [START ON 07/20/2020] sodium chloride    . sodium chloride 20 mL/hr at 07/19/20 1320  . amiodarone 30 mg/hr (07/19/20 1822)  .  ceFAZolin (ANCEF) IV 200 mL/hr at 07/19/20 1800  . dexmedetomidine (PRECEDEX) IV infusion 0.5 mcg/kg/hr (07/19/20 1800)  . electrolyte-A 75 mL/hr at 07/19/20 1800  . famotidine (PEPCID) IV Stopped (07/19/20 1447)  . insulin 2.6 mL/hr at 07/19/20 1800  . lactated ringers    . lactated ringers Stopped (07/19/20 1320)  . lactated ringers 20 mL/hr at 07/19/20 1824  . magnesium sulfate 20 mL/hr at 07/19/20 1800  . nitroGLYCERIN Stopped (07/19/20 1320)  . norepinephrine (LEVOPHED) Adult infusion 8 mcg/min (07/19/20 1800)  . phenylephrine (NEO-SYNEPHRINE) Adult infusion Stopped (07/19/20 1645)  . tranexamic acid (CYKLOKAPRON) infusion ([facility])    . vancomycin      I/O last 3 completed shifts: In: 79.9 [I.V.:79.9] Out: 850  [Urine:850]   CBC Latest Ref Rng & Units 07/19/2020 07/19/2020 07/19/2020  WBC 4.0 - 10.5 K/uL 16.2(H) - -  Hemoglobin 13.0 - 17.0 g/dL 11.6(L) 10.5(L) 10.9(L)  Hematocrit 39.0 - 52.0 % 34.8(L) 31.0(L) 32.0(L)  Platelets 150 - 400 K/uL 186 - -    BMP Latest Ref Rng & Units 07/19/2020 07/19/2020 07/19/2020  Glucose 70 - 99 mg/dL 173(H) - -  BUN 8 - 23 mg/dL 12 - -  Creatinine 0.61 - 1.24 mg/dL 0.70 - -  Sodium 135 - 145 mmol/L 137 138 137  Potassium 3.5 - 5.1 mmol/L 4.9 4.9 5.2(H)  Chloride 98 - 111 mmol/L 102 - -  CO2 22 - 32 mmol/L - - -  Calcium 8.9 - 10.3 mg/dL - - -    ABG    Component Value Date/Time   PHART 7.364 07/19/2020 1209   PCO2ART 43.0 07/19/2020 1209   PO2ART 67 (L) 07/19/2020 1209   HCO3 24.5 07/19/2020 1209   TCO2 24 07/19/2020 1213   ACIDBASEDEF 1.0 07/19/2020 1209   O2SAT 92.0 07/19/2020 1209       Melodie Bouillon, MD 07/19/2020 6:54 PM

## 2020-07-19 NOTE — H&P (Signed)
History and Physical Interval Note:  07/19/2020 7:41 AM  Brian Banks  has presented today for surgery, with the diagnosis of coronary artery disease.  The various methods of treatment have been discussed with the patient and family. After consideration of risks, benefits and other options for treatment, the patient has consented to  Procedure(s): CORONARY ARTERY BYPASS GRAFTING (CABG) (N/A) TRANSESOPHAGEAL ECHOCARDIOGRAM (TEE) (N/A) INDOCYANINE GREEN FLUORESCENCE IMAGING (ICG) (N/A) as a surgical intervention.  The patient's history has been reviewed, patient examined, no change in status, stable for surgery.  I have reviewed the patient's chart and labs.  Questions were answered to the patient's satisfaction.     Wonda Olds

## 2020-07-19 NOTE — Anesthesia Procedure Notes (Signed)
Anesthesia Procedure Image    

## 2020-07-19 NOTE — Discharge Instructions (Addendum)

## 2020-07-19 NOTE — Progress Notes (Signed)
NIF -30 cmh20, VC 1.4

## 2020-07-19 NOTE — Brief Op Note (Addendum)
07/18/2020 - 07/19/2020  11:48 AM  PATIENT:  [name]  75 y.o. male  PRE-OPERATIVE DIAGNOSIS:  1. S/p NSTEMI 2. Coronary artery disease  POST-OPERATIVE DIAGNOSIS:  1. S/p NSEMI 2. Coronary artery disease  PROCEDURE: TRANSESOPHAGEAL ECHOCARDIOGRAM (TEE), CORONARY ARTERY BYPASS GRAFTING (CABG) TIMES THREE (LIMA to LAD, SVG to OM, SVG to PDA) USING LEFT INTERNAL MAMMARY ARTERY AND ENDOSCOPICALLY HARVESTED RIGHT GREATER SAPHENOUS VEIN, and  INDOCYANINE GREEN FLUORESCENCE IMAGING (ICG)   RIGHT EVH HARVEST TIME: 39 minutes; RIGHT EVH PREP TIME: 15 minutes  SURGEON:  Surgeon(s) and Role:    Wonda Olds, MD - Primary  PHYSICIAN ASSISTANT: Lars Pinks PA-C  ASSISTANTS: Dineen Kid RNFA   ANESTHESIA:   general  EBL:  Per anesthesia, perfusion record  DRAINS: Chest tubes placed in the mediastinal and pleural spaces   LOCAL MEDICATIONS USED:  OTHER Exparel  COUNTS CORRECT:  YES  DICTATION: .Dragon Dictation  PLAN OF CARE: Admit to inpatient   PATIENT DISPOSITION:  ICU - intubated and hemodynamically stable.   Delay start of Pharmacological VTE agent (>24hrs) due to surgical blood loss or risk of bleeding: yes  BASELINE WEIGHT: 123.8 kg  Agree with documentation. Niasia Lanphear Z. Orvan Seen, [place]

## 2020-07-19 NOTE — Op Note (Signed)
CARDIOTHORACIC SURGERY OPERATIVE NOTE  Date of Procedure: 07/19/2020  Preoperative Diagnosis: Severe 2-vessel Coronary Artery Disease, status post NSTEMI  Postoperative Diagnosis: Same  Procedure:    Coronary Artery Bypass Grafting x 3  Left Internal Mammary Artery to Distal Left Anterior Descending Coronary Artery; Saphenous Vein Graft to left posterior Descending Coronary Artery; Saphenous Vein Graft to second obtuse Marginal Branch of Left Circumflex Coronary Artery Endoscopic Vein Harvest from right thigh and Lower Leg Completion graft surveillance with indocyanine green fluorescence angiography  Surgeon: B.  Murvin Natal, MD  Assistant: Josie Saunders, PA-C  Anesthesia: General  Operative Findings:  Preserved left ventricular systolic function  Good quality left internal mammary artery conduit  Good quality saphenous vein conduit  Good quality target vessels for grafting    BRIEF CLINICAL NOTE AND INDICATIONS FOR SURGERY  75 year old male was in his usual state of health until yesterday when he began to have chest burning with mild exertion.  The symptoms persisted and were accompanied by nausea.  He presented to the local emergency department where he was found to have a right bundle blanch rhythm and frequent PVCs and was diagnosed with NSTEMI by blood work.  He was taken to the Cath Lab where he was demonstrated to have severe proximal LAD disease and distal branch disease.  He was transferred to [facility] for surgery.  He has been evaluated and is considered an  excellent candidate.   DETAILS OF THE OPERATIVE PROCEDURE  Preparation:  The patient is brought to the operating room on the above mentioned date and central monitoring was established by the anesthesia team including placement of Swan-Ganz catheter and radial arterial line. The patient is placed in the supine position on the operating table.  Intravenous antibiotics are administered. General endotracheal  anesthesia is induced uneventfully. A Foley catheter is placed.  Baseline transesophageal echocardiogram was performed.  Findings were notable for preserved LV function and no significant valvular disease  The patient's chest, abdomen, both groins, and both lower extremities are prepared and draped in a sterile manner. A time out procedure is performed.   Surgical Approach and Conduit Harvest:  A median sternotomy incision was performed and the left internal mammary artery is dissected from the chest wall and prepared for bypass grafting. The left internal mammary artery is notably good quality conduit. Simultaneously, the greater saphenous vein is obtained from the patient's right thigh using endoscopic vein harvest technique. The saphenous vein is notably good quality conduit. After removal of the saphenous vein, the small surgical incisions in the lower extremity are closed with absorbable suture. Following systemic heparinization, the left internal mammary artery was transected distally noted to have excellent flow.   Extracorporeal Cardiopulmonary Bypass and Myocardial Protection:  The pericardium is opened. The ascending aorta is nondiseased in appearance. The ascending aorta and the right atrium are cannulated for cardiopulmonary bypass.  Adequate heparinization is verified.    The entire pre-bypass portion of the operation was notable for stable hemodynamics.  Cardiopulmonary bypass was begun and the surface of the heart is inspected. Distal target vessels are selected for coronary artery bypass grafting. A cardioplegia cannula is placed in the ascending aorta.   The patient is allowed to cool passively to 35C systemic temperature.  The aortic cross clamp is applied and cold blood cardioplegia is delivered initially in an antegrade fashion through the aortic root. Iced saline slush is applied for topical hypothermia.  The initial cardioplegic arrest is rapid with early diastolic arrest.  Repeat doses of cardioplegia are administered intermittently throughout the entire cross clamp portion of the operation through the aortic root and through subsequently placed vein grafts in order to maintain completely flat electrocardiogram.   Coronary Artery Bypass Grafting:   The posterior descending branch of the left coronary artery was grafted using a reversed saphenous vein graft in an end-to-side fashion.  At the site of distal anastomosis the target vessel was good quality and measured approximately 1.5 mm in diameter.  The second obtuse marginal branch of the left circumflex coronary artery was grafted using a reversed saphenous vein graft in an end-to-side fashion.  At the site of distal anastomosis the target vessel was good quality and measured approximately 1.5 mm in diameter.  The distal left anterior coronary artery was grafted with the left internal mammary artery in an end-to-side fashion.  At the site of distal anastomosis the target vessel was good quality and measured approximately 1.5 mm in diameter. Anastomotic patency and runoff was confirmed with indocyanine green fluorescence imaging (SPY).  All proximal vein graft anastomoses were placed directly to the ascending aorta prior to removal of the aortic cross clamp.  De-airing procedures were performed and the aortic cross-clamp was removed.   Procedure Completion:  All proximal and distal coronary anastomoses were inspected for hemostasis and appropriate graft orientation. Epicardial pacing wires are fixed to the right ventricular outflow tract and to the right atrial appendage. The patient is rewarmed to 37C temperature. The patient is weaned and disconnected from cardiopulmonary bypass.  The patient's rhythm at separation from bypass was sinus bradycardia.  The patient was weaned from cardiopulmonary bypass without any inotropic support.   Followup transesophageal echocardiogram performed after separation from bypass  revealed no changes from the preoperative exam.  The aortic and venous cannula were removed uneventfully. Protamine was administered to reverse the anticoagulation. The mediastinum and pleural space were inspected for hemostasis and irrigated with saline solution. The mediastinum and bilateral pleural spaces were drained using fluted chest tubes placed through separate stab incisions inferiorly.  The soft tissues anterior to the aorta were reapproximated loosely. The sternum is closed with double strength sternal wire. The soft tissues anterior to the sternum were closed in multiple layers and the skin is closed with a running subcuticular skin closure.  The post-bypass portion of the operation was notable for stable rhythm and hemodynamics.  No blood products were administered during the operation.   Disposition:  The patient tolerated the procedure well and is transported to the surgical intensive care in stable condition. There are no intraoperative complications. All sponge instrument and needle counts are verified correct at completion of the operation.    Jayme Cloud, MD 07/19/2020 4:15 PM

## 2020-07-19 NOTE — Anesthesia Procedure Notes (Signed)
Central Venous Catheter Insertion Performed by: Myrtie Soman, MD, anesthesiologist Start/End6/09/2020 8:07 AM, 07/19/2020 8:22 AM Patient location: Pre-op. Preanesthetic checklist: patient identified, IV checked, site marked, risks and benefits discussed, surgical consent, monitors and equipment checked, pre-op evaluation, timeout performed and anesthesia consent Position: Trendelenburg Lidocaine 1% used for infiltration and patient sedated Hand hygiene performed  and maximum sterile barriers used  Catheter size: 8.5 Fr PA cath was placed.Sheath introducer Swan type:thermodilution Procedure performed without using ultrasound guided technique. Ultrasound Notes:anatomy identified, needle tip was noted to be adjacent to the nerve/plexus identified, no ultrasound evidence of intravascular and/or intraneural injection and image(s) printed for medical record Attempts: 1 Following insertion, line sutured, dressing applied and Biopatch. Post procedure assessment: blood return through all ports, free fluid flow and no air  Patient tolerated the procedure well with no immediate complications.

## 2020-07-19 NOTE — Transfer of Care (Signed)
Immediate Anesthesia Transfer of Care Note  Patient: Brian Banks  Procedure(s) Performed: CORONARY ARTERY BYPASS GRAFTING (CABG) TIMES THREE ON PUMP USING LEFT INTERNAL MAMMARY ARTERY AND ENDOSCOPICALLY HARVESTED RIGHT GREATER SAPHENOUS VEIN (N/A Chest) TRANSESOPHAGEAL ECHOCARDIOGRAM (TEE) (N/A ) INDOCYANINE GREEN FLUORESCENCE IMAGING (ICG) (N/A ) ENDOVEIN HARVEST OF GREATER SAPHENOUS VEIN (Right )  Patient Location: SICU  Anesthesia Type:General  Level of Consciousness: sedated and Patient remains intubated per anesthesia plan  Airway & Oxygen Therapy: Patient remains intubated per anesthesia plan and Patient placed on Ventilator (see vital sign flow sheet for setting)  Post-op Assessment: Report given to RN and Post -op Vital signs reviewed and stable  Post vital signs: Reviewed and stable  Last Vitals:  Vitals Value Taken Time  BP    Temp 36.2 C 07/19/20 1329  Pulse 77 07/19/20 1329  Resp 12 07/19/20 1329  SpO2 99 % 07/19/20 1329  Vitals shown include unvalidated device data.  Last Pain:  Vitals:   07/19/20 0755  TempSrc: Oral  PainSc:          Complications: No complications documented.

## 2020-07-19 NOTE — Anesthesia Preprocedure Evaluation (Signed)
Anesthesia Evaluation  Patient identified by MRN, date of birth, ID band Patient awake    Reviewed: Allergy & Precautions, NPO status , Patient's Chart, lab work & pertinent test results  Airway Mallampati: II  TM Distance: >3 FB Neck ROM: Full    Dental no notable dental hx.    Pulmonary Current Smoker,    Pulmonary exam normal breath sounds clear to auscultation       Cardiovascular + CAD and + Past MI  Normal cardiovascular exam Rhythm:Regular Rate:Normal   The left ventricular systolic function is normal.  LV end diastolic pressure is mildly elevated.  The left ventricular ejection fraction is 55-65% by visual estimate.   1.  Left dominant coronary arteries with moderate left main disease, critical ostial LAD disease with 2 other lesions in the proximal segment and significant ostial left PDA disease.  The ostial/proximal LAD is moderately to severely calcified.  The takeoff from the left main does not allow stenting of the LAD without jailing the large dominant left circumflex. 2.  Normal LV systolic function and mildly elevated left ventricular end-diastolic pressure.     Neuro/Psych negative neurological ROS  negative psych ROS   GI/Hepatic Neg liver ROS, GERD  ,  Endo/Other  negative endocrine ROS  Renal/GU negative Renal ROS  negative genitourinary   Musculoskeletal negative musculoskeletal ROS (+)   Abdominal   Peds negative pediatric ROS (+)  Hematology negative hematology ROS (+)   Anesthesia Other Findings   Reproductive/Obstetrics negative OB ROS                             Anesthesia Physical Anesthesia Plan  ASA: III  Anesthesia Plan: General   Post-op Pain Management:    Induction: Intravenous  PONV Risk Score and Plan: 1 and Ondansetron, Dexamethasone and Treatment may vary due to age or medical condition  Airway Management Planned: Oral ETT  Additional  Equipment: Arterial line, CVP, PA Cath, TEE and Ultrasound Guidance Line Placement  Intra-op Plan:   Post-operative Plan: Post-operative intubation/ventilation  Informed Consent: I have reviewed the patients History and Physical, chart, labs and discussed the procedure including the risks, benefits and alternatives for the proposed anesthesia with the patient or authorized representative who has indicated his/her understanding and acceptance.     Dental advisory given  Plan Discussed with: CRNA and Surgeon  Anesthesia Plan Comments:         Anesthesia Quick Evaluation

## 2020-07-19 NOTE — Anesthesia Procedure Notes (Signed)
Arterial Line Insertion Start/End6/09/2020 8:15 AM, 07/19/2020 8:25 AM Performed by: Kyung Rudd, CRNA, CRNA  Patient location: Pre-op. Preanesthetic checklist: patient identified, IV checked, site marked, risks and benefits discussed, surgical consent, monitors and equipment checked, pre-op evaluation and timeout performed Lidocaine 1% used for infiltration and patient sedated Left, radial was placed Catheter size: 20 G Hand hygiene performed , maximum sterile barriers used  and Seldinger technique used Allen's test indicative of satisfactory collateral circulation Attempts: 1 Procedure performed without using ultrasound guided technique. Following insertion, Biopatch and dressing applied. Post procedure assessment: normal  Patient tolerated the procedure well with no immediate complications.

## 2020-07-20 ENCOUNTER — Encounter (HOSPITAL_COMMUNITY): Payer: Self-pay | Admitting: Cardiothoracic Surgery

## 2020-07-20 ENCOUNTER — Inpatient Hospital Stay (HOSPITAL_COMMUNITY): Payer: PPO

## 2020-07-20 LAB — BPAM FFP
Blood Product Expiration Date: 202206122359
Blood Product Expiration Date: 202206132359
ISSUE DATE / TIME: 202206081550
ISSUE DATE / TIME: 202206081550
Unit Type and Rh: 6200
Unit Type and Rh: 6200

## 2020-07-20 LAB — BASIC METABOLIC PANEL
Anion gap: 8 (ref 5–15)
Anion gap: 7 (ref 5–15)
BUN: 12 mg/dL (ref 8–23)
BUN: 14 mg/dL (ref 8–23)
CO2: 23 mmol/L (ref 22–32)
CO2: 25 mmol/L (ref 22–32)
Calcium: 7.8 mg/dL — ABNORMAL LOW (ref 8.9–10.3)
Calcium: 7.7 mg/dL — ABNORMAL LOW (ref 8.9–10.3)
Chloride: 103 mmol/L (ref 98–111)
Chloride: 102 mmol/L (ref 98–111)
Creatinine, Ser: 0.86 mg/dL (ref 0.61–1.24)
Creatinine, Ser: 0.92 mg/dL (ref 0.61–1.24)
GFR, Estimated: >60 mL/min (ref >60)
GFR, Estimated: >60 mL/min (ref >60)
Glucose, Bld: 130 mg/dL — ABNORMAL HIGH (ref 70–99)
Glucose, Bld: 108 mg/dL — ABNORMAL HIGH (ref 70–99)
Potassium: 3.9 mmol/L (ref 3.5–5.1)
Potassium: 3.7 mmol/L (ref 3.5–5.1)
Sodium: 134 mmol/L — ABNORMAL LOW (ref 135–145)
Sodium: 134 mmol/L — ABNORMAL LOW (ref 135–145)

## 2020-07-20 LAB — CBC
HCT: 26.2 % — ABNORMAL LOW (ref 39.0–52.0)
HCT: 24.6 % — ABNORMAL LOW (ref 39.0–52.0)
Hemoglobin: 8.9 g/dL — ABNORMAL LOW (ref 13.0–17.0)
Hemoglobin: 8.4 g/dL — ABNORMAL LOW (ref 13.0–17.0)
MCH: 31.2 pg (ref 26.0–34.0)
MCH: 31.5 pg (ref 26.0–34.0)
MCHC: 34 g/dL (ref 30.0–36.0)
MCHC: 34.1 g/dL (ref 30.0–36.0)
MCV: 91.9 fL (ref 80.0–100.0)
MCV: 92.1 fL (ref 80.0–100.0)
Platelets: 157 10*3/uL (ref 150–400)
RBC: 2.85 MIL/uL — ABNORMAL LOW (ref 4.22–5.81)
RBC: 2.67 MIL/uL — ABNORMAL LOW (ref 4.22–5.81)
RDW: 13 % (ref 11.5–15.5)
RDW: 13.2 % (ref 11.5–15.5)
WBC: 9.8 10*3/uL (ref 4.0–10.5)
WBC: 7.2 10*3/uL (ref 4.0–10.5)
nRBC: 0 % (ref 0.0–0.2)
nRBC: 0 % (ref 0.0–0.2)

## 2020-07-20 LAB — GLUCOSE, CAPILLARY
Glucose-Capillary: 138 mg/dL — ABNORMAL HIGH (ref 70–99)
Glucose-Capillary: 140 mg/dL — ABNORMAL HIGH (ref 70–99)
Glucose-Capillary: 141 mg/dL — ABNORMAL HIGH (ref 70–99)
Glucose-Capillary: 147 mg/dL — ABNORMAL HIGH (ref 70–99)
Glucose-Capillary: 149 mg/dL — ABNORMAL HIGH (ref 70–99)
Glucose-Capillary: 152 mg/dL — ABNORMAL HIGH (ref 70–99)
Glucose-Capillary: 155 mg/dL — ABNORMAL HIGH (ref 70–99)
Glucose-Capillary: 159 mg/dL — ABNORMAL HIGH (ref 70–99)
Glucose-Capillary: 167 mg/dL — ABNORMAL HIGH (ref 70–99)
Glucose-Capillary: 168 mg/dL — ABNORMAL HIGH (ref 70–99)
Glucose-Capillary: 176 mg/dL — ABNORMAL HIGH (ref 70–99)
Glucose-Capillary: 128 mg/dL — ABNORMAL HIGH (ref 70–99)
Glucose-Capillary: 135 mg/dL — ABNORMAL HIGH (ref 70–99)
Glucose-Capillary: 136 mg/dL — ABNORMAL HIGH (ref 70–99)
Glucose-Capillary: 117 mg/dL — ABNORMAL HIGH (ref 70–99)
Glucose-Capillary: 192 mg/dL — ABNORMAL HIGH (ref 70–99)
Glucose-Capillary: 124 mg/dL — ABNORMAL HIGH (ref 70–99)
Glucose-Capillary: 103 mg/dL — ABNORMAL HIGH (ref 70–99)
Glucose-Capillary: 124 mg/dL — ABNORMAL HIGH (ref 70–99)
Glucose-Capillary: 112 mg/dL — ABNORMAL HIGH (ref 70–99)
Glucose-Capillary: 143 mg/dL — ABNORMAL HIGH (ref 70–99)
Glucose-Capillary: 198 mg/dL — ABNORMAL HIGH (ref 70–99)
Glucose-Capillary: 115 mg/dL — ABNORMAL HIGH (ref 70–99)

## 2020-07-20 LAB — ECHO INTRAOPERATIVE TEE
Height: 77 in
S' Lateral: 4 cm
Single Plane A4C EF: 47.8 %
Weight: 4368 oz

## 2020-07-20 LAB — PREPARE FRESH FROZEN PLASMA
Unit division: 0
Unit division: 0

## 2020-07-20 LAB — MAGNESIUM
Magnesium: 2.2 mg/dL (ref 1.7–2.4)
Magnesium: 2.1 mg/dL (ref 1.7–2.4)

## 2020-07-20 MED ORDER — POTASSIUM CHLORIDE CRYS ER 20 MEQ PO TBCR
20.0000 meq | EXTENDED_RELEASE_TABLET | ORAL | Status: AC
  Administered 2020-07-20 – 2020-07-21 (×3): 20 meq via ORAL
  Filled 2020-07-20 (×3): qty 1

## 2020-07-20 MED ORDER — ORAL CARE MOUTH RINSE
15.0000 mL | Freq: Two times a day (BID) | OROMUCOSAL | Status: DC
  Administered 2020-07-20 – 2020-07-24 (×9): 15 mL via OROMUCOSAL

## 2020-07-20 MED ORDER — DIAZEPAM 2 MG PO TABS
2.0000 mg | ORAL_TABLET | Freq: Three times a day (TID) | ORAL | Status: AC
  Administered 2020-07-20 – 2020-07-21 (×6): 2 mg via ORAL
  Filled 2020-07-20 (×6): qty 1

## 2020-07-20 MED ORDER — COLCHICINE 0.3 MG HALF TABLET
0.3000 mg | ORAL_TABLET | Freq: Two times a day (BID) | ORAL | Status: DC
  Administered 2020-07-20 – 2020-07-24 (×9): 0.3 mg via ORAL
  Filled 2020-07-20 (×10): qty 1

## 2020-07-20 MED ORDER — THIAMINE HCL 100 MG/ML IJ SOLN
Freq: Once | INTRAVENOUS | Status: AC
  Filled 2020-07-20: qty 1000

## 2020-07-20 MED ORDER — INSULIN ASPART 100 UNIT/ML IJ SOLN
0.0000 [IU] | INTRAMUSCULAR | Status: DC
  Administered 2020-07-21: 8 [IU] via SUBCUTANEOUS
  Administered 2020-07-21 (×2): 2 [IU] via SUBCUTANEOUS
  Administered 2020-07-21: 4 [IU] via SUBCUTANEOUS
  Administered 2020-07-21 – 2020-07-22 (×3): 2 [IU] via SUBCUTANEOUS
  Administered 2020-07-22 (×2): 4 [IU] via SUBCUTANEOUS
  Administered 2020-07-22 – 2020-07-23 (×2): 2 [IU] via SUBCUTANEOUS

## 2020-07-20 MED ORDER — LEVALBUTEROL HCL 0.63 MG/3ML IN NEBU: 0.6300 mg | INHALATION_SOLUTION | Freq: Four times a day (QID) | RESPIRATORY_TRACT | Status: DC | PRN

## 2020-07-20 NOTE — Progress Notes (Signed)
1 Day Post-Op Procedure(s) (LRB): CORONARY ARTERY BYPASS GRAFTING (CABG) TIMES THREE ON PUMP USING LEFT INTERNAL MAMMARY ARTERY AND ENDOSCOPICALLY HARVESTED RIGHT GREATER SAPHENOUS VEIN (N/A) TRANSESOPHAGEAL ECHOCARDIOGRAM (TEE) (N/A) INDOCYANINE GREEN FLUORESCENCE IMAGING (ICG) (N/A) ENDOVEIN HARVEST OF GREATER SAPHENOUS VEIN (Right) Subjective: soreness  Objective: Vital signs in last 24 hours: Temp:  [96.98 F (36.1 C)-99.32 F (37.4 C)] 98.06 F (36.7 C) (06/09 0800) Pulse Rate:  [77-93] 93 (06/09 0800) Cardiac Rhythm: Atrial paced (06/08 2015) Resp:  [10-27] 21 (06/09 0800) BP: (105-125)/(61-73) 125/73 (06/09 0800) SpO2:  [91 %-100 %] 97 % (06/09 0800) Arterial Line BP: (92-150)/(45-74) 150/56 (06/09 0800) FiO2 (%):  [40 %-50 %] 40 % (06/08 1835) Weight:  [129.6 kg] 129.6 kg (06/09 0600)  Hemodynamic parameters for last 24 hours: PAP: (14-31)/(2-19) 25/14 CO:  [4 L/min-7.8 L/min] 7.8 L/min CI:  [1.6 L/min/m2-3.1 L/min/m2] 3.1 L/min/m2  Intake/Output from previous day: 06/08 0701 - 06/09 0700 In: 7809.3 [I.V.:5609.1; Blood:693; IV Piggyback:1507.2] Out: 4616 [Urine:2206; Blood:1200; Chest Tube:1210] Intake/Output this shift: Total I/O In: 156 [I.V.:156] Out: -   General appearance: alert and cooperative Neurologic: intact Heart: regular rate and rhythm, S1, S2 normal, no murmur, click, rub or gallop Lungs: clear to auscultation bilaterally Abdomen: soft, non-tender; bowel sounds normal; no masses,  no organomegaly Extremities: mild edema Wound: c/d/i  Lab Results: Recent Labs    07/19/20 1856 07/19/20 1900 07/19/20 2002 07/20/20 0436  WBC 12.1*  --   --  9.8  HGB 9.8*   < > 8.8* 8.9*  HCT 28.9*   < > 26.0* 26.2*  PLT 175  --   --  157   < > = values in this interval not displayed.   BMET:  Recent Labs    07/19/20 1856 07/19/20 1900 07/19/20 2002 07/20/20 0436  NA 137   < > 139 134*  K 4.1   < > 4.1 3.9  CL 105  --   --  103  CO2 24  --   --  23   GLUCOSE 161*  --   --  130*  BUN 12  --   --  12  CREATININE 0.92  --   --  0.86  CALCIUM 8.0*  --   --  7.8*   < > = values in this interval not displayed.    PT/INR:  Recent Labs    07/19/20 1338  LABPROT 15.7*  INR 1.3*   ABG    Component Value Date/Time   PHART 7.378 07/19/2020 2002   HCO3 22.4 07/19/2020 2002   TCO2 24 07/19/2020 2002   ACIDBASEDEF 2.0 07/19/2020 2002   O2SAT 93.0 07/19/2020 2002   CBG (last 3)  Recent Labs    07/20/20 0424 07/20/20 0525 07/20/20 0739  GLUCAP 128* 135* 136*    Assessment/Plan: S/P Procedure(s) (LRB): CORONARY ARTERY BYPASS GRAFTING (CABG) TIMES THREE ON PUMP USING LEFT INTERNAL MAMMARY ARTERY AND ENDOSCOPICALLY HARVESTED RIGHT GREATER SAPHENOUS VEIN (N/A) TRANSESOPHAGEAL ECHOCARDIOGRAM (TEE) (N/A) INDOCYANINE GREEN FLUORESCENCE IMAGING (ICG) (N/A) ENDOVEIN HARVEST OF GREATER SAPHENOUS VEIN (Right) Mobilize D/c swan, arterial line PT/OT Pain control   LOS: 2 days    Wonda Olds 07/20/2020

## 2020-07-20 NOTE — Anesthesia Postprocedure Evaluation (Signed)
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 ARTERY AND ENDOSCOPICALLY HARVESTED RIGHT GREATER SAPHENOUS VEIN (Chest) TRANSESOPHAGEAL ECHOCARDIOGRAM (TEE) INDOCYANINE GREEN FLUORESCENCE IMAGING (ICG) ENDOVEIN HARVEST OF GREATER SAPHENOUS VEIN (Right)     Patient location during evaluation: SICU Anesthesia Type: General Level of consciousness: sedated Pain management: pain level controlled Vital Signs Assessment: post-procedure vital signs reviewed and stable Respiratory status: patient remains intubated per anesthesia plan Cardiovascular status: stable Postop Assessment: no apparent nausea or vomiting Anesthetic complications: no   No notable events documented.  Last Vitals:  Vitals:   07/20/20 1500 07/20/20 1530  BP: 104/60 (!) 101/58  Pulse: 92 92  Resp: 18 (!) 21  Temp:    SpO2: 97% 93%    Last Pain:  Vitals:   07/20/20 1526  TempSrc:   PainSc: Asleep                 Kendle Turbin S

## 2020-07-20 NOTE — Discharge Summary (Signed)
Physician Discharge Summary       [address]       [place] [address]             [phone]    Patient ID: Brian Banks MRN: [id number] DOB/AGE: 75-Mar-1947 75 y.o.  Admit date: 07/18/2020 Discharge date: 07/25/2020  Admission Diagnoses: Unstable angina ([place]) 2. NSTEMI (non-ST elevated myocardial infarction) ([place]) 3. CAD (coronary artery disease)  Discharge Diagnoses:  S/P CABG x 3 2. Expected post op blood loss anemia 3. History of Hyperlipidemia, unspecified 4. History of Morbidly obese ([place]) 5. History of BPH with obstruction/lower urinary tract symptoms 6. History of GERD (gastroesophageal reflux disease) 7. History of Benign neoplasm of large bowel 8. History of Dysphagia 9. History of Vitamin D deficiency 10. History of Hypogonadism in male    Consults: cardiology  Procedure (s):  Coronary Artery Bypass Grafting x 3             Left Internal Mammary Artery to Distal Left Anterior Descending Coronary Artery; Saphenous Vein Graft to left posterior Descending Coronary Artery; Saphenous Vein Graft to second obtuse Marginal Branch of Left Circumflex Coronary Artery Endoscopic Vein Harvest from right thigh and Lower Leg Completion graft surveillance with indocyanine green fluorescence angiography by Dr. Orvan Seen on 07/19/2020.  History of Presenting Illness: This is a 75 year old man was in his usual state of health until this morning when he experienced dull chest pain and nausea.  This did not subside with rest.  He had had no prior episodes that were like this.  He does have a history of reflux and it was not reminiscent of that.  He presented to the emergency room in [place] where his EKG demonstrated right bundle branch block and PVCs.  He also had a mildly elevated high-sensitivity troponin.  He underwent left heart catheterization demonstrating severe proximal LAD disease and a left dominant coronary system.  There was distal branch disease as  well.  He is transferred to [facility] for CABG.  He is chest pain-free now and hemodynamically stable.  He denies any history of strokes or dysrhythmias. Dr. Orvan Seen discussed the need for coronary artery bypass grafting surgery. Potential risks, benefits, and complications of the surgery were discussed with the patient and he agreed to proceed with surgery. Pre op operative carotid duplex US showed no significant internal carotid artery stenosis bilaterally. Patient underwent a CABG x on 07/19/2020.  Brief Hospital Course:  Patient was extubated the evening of surgery. He remained afebrile and hemodynamically stable. He was weaned off Neo Synephrine and Nitro drips. He remained on an Amiodarone drip. He was initially AAI paced. He was transitioned to oral Amiodarone on 06/10. Gordy Councilman, a line, chest tubes, and foley were removed early in his post operative course. He was later started on Lopressor. He was volume overloaded and diuresed accordingly. He was weaned off the Insulin drip. His pre op HGA1C is 5.9. He will need further surveillance by his medical doctor of his [facility] after discharge. He had expected post op blood loss anemia. He did not require a post op transfusion. His H and H was 8.7 and 24.7 on 06/10. He was started on Trinsicon. He was started on Lopressor on 06/10. PT consult was obtained to assist with ambulation.  The patient continues to make good progress following transfer to the 4 E. cardiology telemetry unit.  His rhythm has remained stable.  Chest tubes continue to have moderate drainage but were significantly decreased  on 07/23/2020 and they were removed at that time.  Epicardial pacing wires were removed on 07/23/2020.  He continued to do well in regards to cardiac rehabilitation modalities.  Incisions were noted to be healing well without evidence of infection.  Oxygen was weaned and he maintained good saturations on room air.  He was tolerating diet and having normal bowel  movements.  Most recent labs dated 07/24/2020 were stable with BUN 25/creatinine 0.95.  Hemoglobin and hematocrit were improving with equilibration of volume status.  Most recent value also on 07/24/2020 are 9.2 and 26.8 respectively.  At the time of discharge the patient was felt to be quite stable.   Latest Vital Signs: Blood pressure 113/64, pulse 83, temperature 98.7 F (37.1 C), temperature source Oral, resp. rate 18, height 6\' 5"  (1.956 m), weight 121.4 kg, SpO2 98 %.  Physical Exam:  General appearance: alert, cooperative, and no distress Heart: regular rate and rhythm Lungs: clear to auscultation bilaterally Abdomen: benign Extremities: no edema Wound: incis healing well Discharge Condition:Stable and discharged to home.  Recent laboratory studies:  Lab Results  Component Value Date   WBC 8.2 07/24/2020   HGB 9.2 (L) 07/24/2020   HCT 26.8 (L) 07/24/2020   MCV 91.2 07/24/2020   PLT 231 07/24/2020   Lab Results  Component Value Date   NA 136 07/24/2020   K 3.9 07/24/2020   CL 103 07/24/2020   CO2 24 07/24/2020   CREATININE 0.95 07/24/2020   GLUCOSE 110 (H) 07/24/2020      Diagnostic Studies: DG Chest 2 View  Result Date: 07/24/2020 CLINICAL DATA:  History of cardiac surgery EXAM: CHEST - 2 VIEW COMPARISON:  Two days ago FINDINGS: Decreased left pleural effusion and adjacent atelectasis. Trace left apical pneumothorax. Stable heart size and mediastinal contours. IMPRESSION: Improving atelectasis and pleural fluid. Trace left apical pneumothorax. Electronically Signed   By: Monte Fantasia M.D.   On: 07/24/2020 09:17   DG Chest 2 View  Result Date: 07/18/2020 CLINICAL DATA:  Preoperative evaluation. EXAM: CHEST - 2 VIEW COMPARISON:  Earlier today. FINDINGS: Normal sized heart. Tortuous aorta. Clear lungs with normal vascularity. Stable mild prominence of the interstitial markings. No pleural fluid. Unremarkable bones. IMPRESSION: Stable mild changes of COPD.  No acute  abnormality. Electronically Signed   By: Claudie Revering M.D.   On: 07/18/2020 22:00   DG Chest 2 View  Result Date: 07/18/2020 CLINICAL DATA:  Chest tightness with shortness of breath.  Smoker. EXAM: CHEST - 2 VIEW COMPARISON:  11/14/2011 CT FINDINGS: Mild hyperinflation. Numerous leads and wires project over the chest. Midline trachea. Normal heart size. Tortuous thoracic aorta. No pleural effusion or pneumothorax. Mild pulmonary interstitial thickening. No lobar consolidation. IMPRESSION: 1. No acute cardiopulmonary disease. 2. Hyperinflation and interstitial thickening, likely related to smoking/chronic bronchitis. Electronically Signed   By: Abigail Miyamoto M.D.   On: 07/18/2020 10:32   CT CHEST WO CONTRAST  Result Date: 07/18/2020 CLINICAL DATA:  Pre CABG evaluation. EXAM: CT CHEST WITHOUT CONTRAST TECHNIQUE: Multidetector CT imaging of the chest was performed following the standard protocol without IV contrast. COMPARISON:  11/14/2011 FINDINGS: Cardiovascular: Heart size near the upper limit of normal with enlarged left atrium and left ventricle. The aortic root measures 3.3 cm in maximum AP diameter. The ascending thoracic aorta measures 3.2 cm in maximum diameter. The proximal aortic arch measures 2.9 cm. The distal aortic arch measures 3.7 cm in maximum diameter. The descending thoracic aorta measures 2.8 cm in maximum diameter. Dense  coronary artery calcifications. Mild thoracic aortic calcifications. Mediastinum/Nodes: No enlarged mediastinal or axillary lymph nodes. Thyroid gland, trachea, and esophagus demonstrate no significant findings. Lungs/Pleura: Lungs are clear. No pleural effusion or pneumothorax. Upper Abdomen: Unremarkable. Musculoskeletal: Thoracic and lower cervical spine degenerative changes. IMPRESSION: 1. 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 Recommend annual imaging followup by CTA or MRA. This recommendation follows 2010  ACCF/AHA/AATS/ACR/ASA/SCA/SCAI/SIR/STS/SVM Guidelines for the Diagnosis and Management of Patients with Thoracic Aortic Disease. Circulation.2010; 121: H846-N629. Aortic aneurysm NOS (ICD10-I71.9) 2.  Calcific coronary artery and aortic atherosclerosis. Aortic Atherosclerosis (ICD10-I70.0). Electronically Signed   By: Claudie Revering M.D.   On: 07/18/2020 22:07   CARDIAC CATHETERIZATION  Result Date: 07/18/2020  Ost LM to Dist LM lesion is 40% stenosed.  Dist LM to Ost LAD lesion is 95% stenosed.  Ost Cx to Prox Cx lesion is 20% stenosed.  Prox LAD lesion is 80% stenosed.  Prox LAD to Mid LAD lesion is 95% stenosed.  LPDA lesion is 70% stenosed.  3rd Mrg lesion is 50% stenosed.  The left ventricular systolic function is normal.  LV end diastolic pressure is mildly elevated.  The left ventricular ejection fraction is 55-65% by visual estimate.  1.  Left dominant coronary arteries with moderate left main disease, critical ostial LAD disease with 2 other lesions in the proximal segment and significant ostial left PDA disease.  The ostial/proximal LAD is moderately to severely calcified.  The takeoff from the left main does not allow stenting of the LAD without jailing the large dominant left circumflex. 2.  Normal LV systolic function and mildly elevated left ventricular end-diastolic pressure. Recommendations: The patient's LAD disease is not approachable percutaneously.  I think the best option is CABG. Transfer the patient to [facility]. Resume heparin at 9 PM. Obtain an echocardiogram.   DG Chest Port 1 View  Result Date: 07/22/2020 CLINICAL DATA:  Status post median sternotomy EXAM: PORTABLE CHEST 1 VIEW COMPARISON:  07/21/2020 FINDINGS: Right IJ Cordis sheath is unchanged. Prior median sternotomy. Midline trachea. Mild cardiomegaly. Small left pleural effusion is similar, given differences in technique. No pneumothorax. Improved interstitial edema with mild venous congestion remaining. Left  greater than right base airspace disease is not significantly changed. IMPRESSION: Slightly improved aeration, attributed to it improved interstitial edema. Persistent left pleural effusion with bibasilar Airspace disease, likely atelectasis. Electronically Signed   By: Abigail Miyamoto M.D.   On: 07/22/2020 09:53   DG Chest Port 1 View  Result Date: 07/21/2020 CLINICAL DATA:  Status post cardiac surgery. EXAM: PORTABLE CHEST 1 VIEW COMPARISON:  July 20, 2020. FINDINGS: Stable cardiomegaly. Sternotomy wires are noted. Swan-Ganz catheter has been removed. Stable left-sided chest tube is noted without pneumothorax. Stable bilateral pulmonary passed CT's are noted, left greater than right, concerning for atelectasis or possibly edema. Bony thorax is unremarkable. IMPRESSION: Stable left-sided chest tube without pneumothorax. Stable bilateral lung opacities as described above. Electronically Signed   By: Marijo Conception M.D.   On: 07/21/2020 08:07   DG Chest Port 1 View  Result Date: 07/20/2020 CLINICAL DATA:  Chest tube.  Open-heart surgery. EXAM: PORTABLE CHEST 1 VIEW COMPARISON:  07/19/2020. FINDINGS: Interim extubation and removal of NG tube. Swan-Ganz catheter, mediastinal drainage catheter, left chest tube in stable position. Prior CABG. Cardiomegaly. Diffuse bilateral pulmonary infiltrates/edema noted on today's exam. Small left pleural effusion cannot be excluded. No pneumothorax. IMPRESSION: 1. Interim extubation removal of NG tube. Swan-Ganz catheter, mediastinal drainage  catheter, left chest tube in stable position. 2. Prior CABG. Cardiomegaly. Diffuse bilateral pulmonary infiltrates/edema noted on today's exam. Small left pleural effusion cannot be excluded. Findings suggest CHF. Electronically Signed   By: Marcello Moores  Register   On: 07/20/2020 07:58   DG Chest Port 1 View  Result Date: 07/19/2020 CLINICAL DATA:  Status post CABG EXAM: PORTABLE CHEST 1 VIEW COMPARISON:  Chest radiograph 07/18/2020, chest CT  07/18/2020 FINDINGS: Endotracheal tube overlies the midthoracic trachea. Intact sternotomy wires. Postsurgical changes of CABG. There is a right neck approach pulmonary artery catheter overlying the main pulmonary artery. There is a left basilar chest tube and a mediastinal drain noted. There is a nasogastric tube with side port overlying the gastroesophageal junction. Mildly enlarged cardiac silhouette. There are mild interstitial opacities. Bibasilar subsegmental atelectasis. No large pleural effusion. Possible trace left apical pneumothorax, likely expected given recent thoracic surgery. Other than a new median sternotomy, there are no other acute osseous abnormalities. Bilateral shoulder degenerative changes. IMPRESSION: Postsurgical chest with lines and tubes as described above. Of note, and the nasogastric tube side port overlies the gastroesophageal junction, recommend advancement by 5.0 cm. Trace left apical pneumothorax, likely related to recent surgery, with basilar chest tube in place. Mild diffuse interstitial opacities, likely mild interstitial edema. Bibasilar atelectasis. These results will be called to the ordering clinician or representative by the Radiologist Assistant, and communication documented in the PACS or [employer]. Electronically Signed   By: Maurine Simmering   On: 07/19/2020 14:15   ECHO INTRAOPERATIVE TEE  Result Date: 07/20/2020  *INTRAOPERATIVE TRANSESOPHAGEAL REPORT *  Patient Name:   Brian Banks Date of Exam: 07/19/2020 Medical Rec #:  [id number]        Height:       77.0 in Accession #:    [id number]       Weight:       273.0 lb Date of Birth:  09-20-1945         BSA:          2.55 m Patient Age:    56 years         BP:           162/64 mmHg Patient Gender: M                HR:           62 bpm. Exam Location:  Inpatient Transesophogeal exam was perform intraoperatively during surgical procedure. Patient was closely monitored under general anesthesia during the entirety of  examination. Indications:     CABG Sonographer:     Merrie Roof RDCS Performing Phys: [id number] [facility] ATKINS Diagnosing Phys: Myrtie Soman MD Complications: No known complications during this procedure. POST-OP IMPRESSIONS - Left Ventricle: The left ventricle is unchanged from pre-bypass. - Right Ventricle: The right ventricle appears unchanged from pre-bypass. - Aorta: The aorta appears unchanged from pre-bypass. - Left Atrium: The left atrium appears unchanged from pre-bypass. - Left Atrial Appendage: The left atrial appendage appears unchanged from pre-bypass. - Aortic Valve: The aortic valve appears unchanged from pre-bypass. - Mitral Valve: The mitral valve appears unchanged from pre-bypass. - Tricuspid Valve: The tricuspid valve appears unchanged from pre-bypass. - Pulmonic Valve: The pulmonic valve appears unchanged from pre-bypass. - Interatrial Septum: The interatrial septum appears unchanged from pre-bypass. - Interventricular Septum: The interventricular septum appears unchanged from pre-bypass. - Pericardium: The pericardium appears unchanged from pre-bypass. - Comments: Good LV function post bypass, No RWMA. PRE-OP FINDINGS  Left  Ventricle: The left ventricle has low normal systolic function, with an ejection fraction of 50-55%. The cavity size was normal. There is no increase in left ventricular wall thickness. There is mild concentric left ventricular hypertrophy. Right Ventricle: The right ventricle has normal systolic function. The cavity was normal. There is no increase in right ventricular wall thickness. Right ventricular systolic pressure is normal. Left Atrium: Left atrial size was normal in size. No left atrial/left atrial appendage thrombus was detected. The left atrial appendage is well visualized and there is no evidence of thrombus present. Right Atrium: Right atrial size was normal in size. Interatrial Septum: No atrial level shunt detected by color flow Doppler. Agitated saline contrast  was given intravenously to evaluate for intracardiac shunting. Agitated saline contrast bubble study was negative, with no evidence of any interatrial shunt. Pericardium: The pericardium was not assessed. Mitral Valve: The mitral valve is normal in structure. Mitral valve regurgitation is trivial by color flow Doppler. There is No evidence of mitral stenosis. Tricuspid Valve: The tricuspid valve was normal in structure. Tricuspid valve regurgitation is mild by color flow Doppler. Aortic Valve: The aortic valve is normal in structure. Aortic valve regurgitation was not visualized by color flow Doppler. There is no stenosis of the aortic valve. Pulmonic Valve: The pulmonic valve was normal in structure, with normal. Pulmonic valve regurgitation is not visualized by color flow Doppler. Aorta: The aortic root and ascending aorta are normal in size and structure. +--------------+-------++ LEFT VENTRICLE        +--------------+-------++ PLAX 2D               +--------------+-------++ LVIDd:        6.30 cm +--------------+-------++ LVIDs:        4.00 cm +--------------+-------++ LV SV:        131 ml  +--------------+-------++ LV SV Index:  50.01   +--------------+-------++                       +--------------+-------++  +------------------+---------++ LV Volumes (MOD)            +------------------+---------++ LV area d, A4C:   48.10 cm +------------------+---------++ LV area s, A4C:   31.00 cm +------------------+---------++ LV major d, A4C:  9.43 cm   +------------------+---------++ LV major s, A4C:  7.68 cm   +------------------+---------++ LV vol d, MOD A4C:201.0 ml  +------------------+---------++ LV vol s, MOD A4C:105.0 ml  +------------------+---------++ LV SV MOD A4C:    201.0 ml  +------------------+---------++  Myrtie Soman MD Electronically signed by Myrtie Soman MD Signature Date/Time: 07/20/2020/3:39:47 PM    Final    VAS US DOPPLER PRE  CABG  Result Date: 07/19/2020 PREOPERATIVE VASCULAR EVALUATION Patient Name:  Brian Banks  Date of Exam:   07/18/2020 Medical Rec #: [id number]         Accession #:    [id number] Date of Birth: 06-Apr-1945          Patient Gender: M Patient Age:   20Y Exam Location:  [facility] Procedure:      VAS US DOPPLER PRE CABG Referring Phys: [id number] [place] --------------------------------------------------------------------------------  Indications:     Pre-CABG. Risk Factors:    Hyperlipidemia, current smoker, prior MI, coronary artery                  disease. Comparison       No prior studies. Study: Performing Technologist: Darlin Coco RDMS,RVT  Examination Guidelines: A complete evaluation includes B-mode imaging,  spectral Doppler, color Doppler, and power Doppler as needed of all accessible portions of each vessel. Bilateral testing is considered an integral part of a complete examination. Limited examinations for reoccurring indications may be performed as noted.  Right Carotid Findings: +----------+--------+--------+--------+-----------------------+--------+           PSV cm/sEDV cm/sStenosisDescribe               Comments +----------+--------+--------+--------+-----------------------+--------+ CCA Prox  124     21                                              +----------+--------+--------+--------+-----------------------+--------+ CCA Distal88      20                                              +----------+--------+--------+--------+-----------------------+--------+ ICA Prox  110     27      1-39%   heterogenous and smooth         +----------+--------+--------+--------+-----------------------+--------+ ICA Distal65      20                                              +----------+--------+--------+--------+-----------------------+--------+ ECA       110     28                                               +----------+--------+--------+--------+-----------------------+--------+ Portions of this table do not appear on this page. +----------+--------+-------+----------------+------------+           PSV cm/sEDV cmsDescribe        Arm Pressure +----------+--------+-------+----------------+------------+ Subclavian95             Multiphasic, WNL             +----------+--------+-------+----------------+------------+ +---------+--------+--+--------+--+---------+ VertebralPSV cm/s48EDV cm/s12Antegrade +---------+--------+--+--------+--+---------+ Left Carotid Findings: +----------+--------+--------+--------+------------+--------+           PSV cm/sEDV cm/sStenosisDescribe    Comments +----------+--------+--------+--------+------------+--------+ CCA Prox  132     18                                   +----------+--------+--------+--------+------------+--------+ CCA Distal81      18                                   +----------+--------+--------+--------+------------+--------+ ICA Prox  97      30      1-39%   heterogenous         +----------+--------+--------+--------+------------+--------+ ICA Distal90      31                                   +----------+--------+--------+--------+------------+--------+ ECA       76      10                                   +----------+--------+--------+--------+------------+--------+ +----------+--------+--------+----------------+------------+  SubclavianPSV cm/sEDV cm/sDescribe        Arm Pressure +----------+--------+--------+----------------+------------+           100             Multiphasic, WNL             +----------+--------+--------+----------------+------------+ +---------+--------+--+--------+--+---------+ VertebralPSV cm/s34EDV cm/s10Antegrade +---------+--------+--+--------+--+---------+  ABI Findings: +--------+------------------+-----+---------+--------+ Right   Rt Pressure (mmHg)IndexWaveform  Comment  +--------+------------------+-----+---------+--------+ Brachial                       triphasic         +--------+------------------+-----+---------+--------+ PTA                            triphasic         +--------+------------------+-----+---------+--------+ DP                             biphasic          +--------+------------------+-----+---------+--------+ +--------+------------------+-----+---------+-------+ Left    Lt Pressure (mmHg)IndexWaveform Comment +--------+------------------+-----+---------+-------+ Brachial                       triphasic        +--------+------------------+-----+---------+-------+ PTA                            biphasic         +--------+------------------+-----+---------+-------+ DP                             biphasic         +--------+------------------+-----+---------+-------+  Right Doppler Findings: +--------+--------+-----+---------+--------+ Site    PressureIndexDoppler  Comments +--------+--------+-----+---------+--------+ Brachial             triphasic         +--------+--------+-----+---------+--------+ Radial               triphasic         +--------+--------+-----+---------+--------+ Ulnar                triphasic         +--------+--------+-----+---------+--------+  Left Doppler Findings: +--------+--------+-----+---------+--------+ Site    PressureIndexDoppler  Comments +--------+--------+-----+---------+--------+ Brachial             triphasic         +--------+--------+-----+---------+--------+ Radial               triphasic         +--------+--------+-----+---------+--------+ Ulnar                triphasic         +--------+--------+-----+---------+--------+  Summary: Right Carotid: Velocities in the right ICA are consistent with a 1-39% stenosis. Left Carotid: Velocities in the left ICA are consistent with a 1-39% stenosis. Vertebrals:  Bilateral vertebral  arteries demonstrate antegrade flow. Subclavians: Normal flow hemodynamics were seen in bilateral subclavian              arteries. Right Upper Extremity: Doppler waveform obliterate with right radial compression. Doppler waveforms remain within normal limits with right ulnar compression. Left Upper Extremity: Doppler waveform obliterate with left radial compression. Doppler waveforms remain within normal limits with left ulnar compression.  Electronically signed by Jamelle Haring on 07/19/2020 at 7:12:04 PM.    Final    Discharge Instructions     Amb Referral  to Cardiac Rehabilitation   Complete by: As directed    Diagnosis: CABG   CABG X ___: 3   After initial evaluation and assessments completed: Virtual Based Care may be provided alone or in conjunction with Phase 2 Cardiac Rehab based on patient barriers.: Yes   Discharge patient   Complete by: As directed    Discharge disposition: 01-Home or Self Care   Discharge patient date: 07/24/2020       Discharge Medications: Allergies as of 07/24/2020       Reactions   Lovastatin    Androgel [testosterone] Rash        Medication List     TAKE these medications    amiodarone 200 MG tablet Commonly known as: PACERONE Take 1 tablet (200 mg total) by mouth 2 (two) times daily.   aspirin 81 MG EC tablet Take 1 tablet (81 mg total) by mouth daily. Swallow whole.   clopidogrel 75 MG tablet Commonly known as: PLAVIX Take 1 tablet (75 mg total) by mouth daily.   colchicine 0.6 MG tablet Take 0.5 tablets (0.3 mg total) by mouth daily.   ferrous [id number] C-folic acid capsule Commonly known as: TRINSICON / FOLTRIN Take 1 capsule by mouth 2 (two) times daily after a meal.   finasteride 5 MG tablet Commonly known as: PROSCAR Take 1 tablet (5 mg total) by mouth daily with supper.   metoprolol tartrate 25 MG tablet Commonly known as: LOPRESSOR Take 0.5 tablets (12.5 mg total) by mouth 2 (two) times daily.   omeprazole 20  MG capsule Commonly known as: PRILOSEC Take 20 mg by mouth daily with supper.   rosuvastatin 20 MG tablet Commonly known as: CRESTOR Take 1 tablet (20 mg total) by mouth daily.   traMADol 50 MG tablet Commonly known as: ULTRAM Take 1 tablet (50 mg total) by mouth every 6 (six) hours as needed for up to 7 days for moderate pain.       Follow Up Appointments:  Follow-up Information     Wonda Olds, MD. Go on 07/27/2020.   Specialty: Cardiothoracic Surgery Why: Appointment time is at 11:45 am Contact information: [address] [place] [address] [phone]         Adin Hector, MD. Call.   Specialty: Internal Medicine Why: for a follow up regarding further surveillance of pre op HGA1C 5.9 (borderline diabetes) Contact information: [place] [address] [phone]         Arvil Chaco, PA-C. Go on 08/11/2020.   Specialties: Physician Assistant, Cardiology Why: Appointment time is at 8:30 am Contact information: [place] [address] [phone]                 The patient has been discharged on:   1.Beta Blocker:  Yes [  x ]                              No   [   ]                              If No, reason:  2.Ace Inhibitor/ARB: Yes [   ]  No  [  n  ]                                     If No, reason:BP too low too initiate  3.Statin:   Yes [  y ]                  No  [   ]                  If No, reason:  4.Ecasa:  Yes  [  x ]                  No   [   ]                  If No, reason:  Patient had ACS upon admission:  Plavix/P2Y12 inhibitor: Yes [ y  ]                                      No  [   ]   Signed: Gaspar Bidding 07/25/2020, 1:58 PM

## 2020-07-20 NOTE — Progress Notes (Signed)
PT Cancellation Note  Patient Details Name: Brian Banks MRN: [id number] DOB: 1945/07/17   Cancelled Treatment:    Reason Eval/Treat Not Completed: Patient not medically ready (pt remains with swan introducer and await lines pulled for mobility)   Takya Vandivier B Khalidah Herbold 07/20/2020, 11:59 AM Bayard Males, PT Acute Rehabilitation Services Pager: [phone] Office: [phone]

## 2020-07-21 ENCOUNTER — Inpatient Hospital Stay (HOSPITAL_COMMUNITY): Payer: PPO

## 2020-07-21 LAB — GLUCOSE, CAPILLARY
Glucose-Capillary: 122 mg/dL — ABNORMAL HIGH (ref 70–99)
Glucose-Capillary: 122 mg/dL — ABNORMAL HIGH (ref 70–99)
Glucose-Capillary: 140 mg/dL — ABNORMAL HIGH (ref 70–99)
Glucose-Capillary: 174 mg/dL — ABNORMAL HIGH (ref 70–99)
Glucose-Capillary: 213 mg/dL — ABNORMAL HIGH (ref 70–99)
Glucose-Capillary: 96 mg/dL (ref 70–99)

## 2020-07-21 LAB — BASIC METABOLIC PANEL
Anion gap: 5 (ref 5–15)
BUN: 13 mg/dL (ref 8–23)
CO2: 25 mmol/L (ref 22–32)
Calcium: 7.8 mg/dL — ABNORMAL LOW (ref 8.9–10.3)
Chloride: 102 mmol/L (ref 98–111)
Creatinine, Ser: 0.88 mg/dL (ref 0.61–1.24)
GFR, Estimated: >60 mL/min (ref >60)
Glucose, Bld: 132 mg/dL — ABNORMAL HIGH (ref 70–99)
Potassium: 4.1 mmol/L (ref 3.5–5.1)
Sodium: 132 mmol/L — ABNORMAL LOW (ref 135–145)

## 2020-07-21 LAB — CBC
HCT: 24.7 % — ABNORMAL LOW (ref 39.0–52.0)
Hemoglobin: 8.7 g/dL — ABNORMAL LOW (ref 13.0–17.0)
MCH: 31.9 pg (ref 26.0–34.0)
MCHC: 35.2 g/dL (ref 30.0–36.0)
MCV: 90.5 fL (ref 80.0–100.0)
Platelets: 123 10*3/uL — ABNORMAL LOW (ref 150–400)
RBC: 2.73 MIL/uL — ABNORMAL LOW (ref 4.22–5.81)
RDW: 13.1 % (ref 11.5–15.5)
WBC: 9 10*3/uL (ref 4.0–10.5)
nRBC: 0 % (ref 0.0–0.2)

## 2020-07-21 MED ORDER — FUROSEMIDE 10 MG/ML IJ SOLN
40.0000 mg | Freq: Two times a day (BID) | INTRAMUSCULAR | Status: DC
  Administered 2020-07-21 – 2020-07-22 (×4): 40 mg via INTRAVENOUS
  Filled 2020-07-21 (×4): qty 4

## 2020-07-21 MED ORDER — FINASTERIDE 5 MG PO TABS: 5.0000 mg | ORAL_TABLET | Freq: Every day | ORAL | Status: DC

## 2020-07-21 MED ORDER — FE FUMARATE-B12-VIT C-FA-IFC PO CAPS
1.0000 | ORAL_CAPSULE | Freq: Two times a day (BID) | ORAL | Status: DC
  Administered 2020-07-21 – 2020-07-24 (×7): 1 via ORAL
  Filled 2020-07-21 (×7): qty 1

## 2020-07-21 MED ORDER — POTASSIUM CHLORIDE CRYS ER 20 MEQ PO TBCR
20.0000 meq | EXTENDED_RELEASE_TABLET | Freq: Two times a day (BID) | ORAL | Status: DC
  Administered 2020-07-21 – 2020-07-22 (×4): 20 meq via ORAL
  Filled 2020-07-21 (×4): qty 1

## 2020-07-21 MED ORDER — AMIODARONE HCL 200 MG PO TABS
400.0000 mg | ORAL_TABLET | Freq: Two times a day (BID) | ORAL | Status: DC
  Administered 2020-07-21 – 2020-07-24 (×7): 400 mg via ORAL
  Filled 2020-07-21 (×6): qty 2

## 2020-07-21 MED FILL — Calcium Chloride Inj 10%: INTRAVENOUS | Qty: 10 | Status: AC

## 2020-07-21 MED FILL — Electrolyte-R (PH 7.4) Solution: INTRAVENOUS | Qty: 4000 | Status: AC

## 2020-07-21 MED FILL — Sodium Bicarbonate IV Soln 8.4%: INTRAVENOUS | Qty: 50 | Status: AC

## 2020-07-21 MED FILL — Heparin Sodium (Porcine) Inj 1000 Unit/ML: INTRAMUSCULAR | Qty: 30 | Status: AC

## 2020-07-21 MED FILL — Heparin Sodium (Porcine) Inj 1000 Unit/ML: INTRAMUSCULAR | Qty: 10 | Status: AC

## 2020-07-21 MED FILL — Magnesium Sulfate Inj 50%: INTRAMUSCULAR | Qty: 10 | Status: AC

## 2020-07-21 MED FILL — Sodium Chloride IV Soln 0.9%: INTRAVENOUS | Qty: 3000 | Status: AC

## 2020-07-21 MED FILL — Mannitol IV Soln 20%: INTRAVENOUS | Qty: 500 | Status: AC

## 2020-07-21 MED FILL — Potassium Chloride Inj 2 mEq/ML: INTRAVENOUS | Qty: 40 | Status: AC

## 2020-07-21 NOTE — Progress Notes (Signed)
Patient transported to 4E via bed and monitor.  In no distress at time of transfer.  Pts belongings all taken per family prior to transfer.  POC continued.

## 2020-07-21 NOTE — Evaluation (Signed)
Occupational Therapy Evaluation Patient Details Name: Brian Banks MRN: [id number] DOB: 11/01/1945 Today's Date: 07/21/2020    History of Present Illness 75 yo admitted 6/7 to [facility] with chest pain with NSTEMI and s/p cath transferred to [facility] with critical CAD. CABG x 4 performed 6/8. PMhx: HLD, BPH, GERD, obesity   Clinical Impression   PTA patient independent. Admitted for above and presenting with problem list below, including impaired activity tolerance, generalized weakness, sternal precautions. Reviewed sternal precautions and ADL compensatory techniques.  Pt requires min guard for transfers and functional mobility using RW, up to mod assist for LB ADLs.  Patient will benefit from continued OT services while admitted to optimize independence and safety with ADLs, mobility but anticipate he will progress well with no further needs required after dc.      Follow Up Recommendations  No OT follow up;Supervision - Intermittent    Equipment Recommendations  3 in 1 bedside commode    Recommendations for Other Services       Precautions / Restrictions Precautions Precautions: Sternal Precaution Booklet Issued: Yes (comment) Precaution Comments: chest tube, pacer Restrictions Weight Bearing Restrictions: Yes Other Position/Activity Restrictions: sternal      Mobility Bed Mobility               General bed mobility comments: in chair on arrival and end of session    Transfers Overall transfer level: Needs assistance Equipment used: Rolling walker (2 wheeled) Transfers: Sit to/from Stand Sit to Stand: Min guard         General transfer comment: good recall of hand placement, min guard for safety/balance    Balance Overall balance assessment: Mild deficits observed, not formally tested                                         ADL either performed or assessed with clinical judgement   ADL Overall ADL's : Needs assistance/impaired      Grooming: Min guard;Standing   Upper Body Bathing: Minimal assistance;Sitting   Lower Body Bathing: Minimal assistance;Sit to/from stand   Upper Body Dressing : Sitting;Minimal assistance   Lower Body Dressing: Moderate assistance;Sit to/from stand Lower Body Dressing Details (indicate cue type and reason): requires assist for socks, min guard sit to stand Toilet Transfer: Min guard;Ambulation;RW           Functional mobility during ADLs: Min guard;Rolling walker;Cueing for safety General ADL Comments: pt limited by pain, weakness, precautions     Vision         Perception     Praxis      Pertinent Vitals/Pain Pain Assessment: 0-10 Pain Score: 4  Pain Location: L ribs Pain Descriptors / Indicators: Sore Pain Intervention(s): Limited activity within patient's tolerance;Monitored during session;Repositioned     Hand Dominance Right   Extremity/Trunk Assessment Upper Extremity Assessment Upper Extremity Assessment: Overall WFL for tasks assessed   Lower Extremity Assessment Lower Extremity Assessment: Defer to PT evaluation   Cervical / Trunk Assessment Cervical / Trunk Assessment: Normal   Communication Communication Communication: No difficulties   Cognition Arousal/Alertness: Awake/alert Behavior During Therapy: WFL for tasks assessed/performed Overall Cognitive Status: Within Functional Limits for tasks assessed                                     General  Comments  daughter present and supportive    Exercises General Exercises - Lower Extremity Long Arc Quad: AROM;Both;15 reps;Seated Hip Flexion/Marching: AROM;Both;15 reps;Seated   Shoulder Instructions      Home Living Family/patient expects to be discharged to:: Private residence Living Arrangements: Alone Available Help at Discharge: Family;Available 24 hours/day Type of Home: House Home Access: Stairs to enter [employer] of Steps: 2 Entrance Stairs-Rails:  Left Home Layout: Multi-level;Bed/bath upstairs Alternate Level Stairs-Number of Steps: 3 steps on main level to get to [employer] Shower/Tub: Occupational psychologist: Handicapped height     Home Equipment: Environmental consultant - 4 wheels;Grab bars - tub/shower;Grab bars - toilet;Cane - single point;Crutches   Additional Comments: pt checking to see if he has a BSC      Prior Functioning/Environment Level of Independence: Independent                 OT Problem List: Decreased activity tolerance;Impaired balance (sitting and/or standing);Decreased safety awareness;Decreased knowledge of use of DME or AE;Decreased knowledge of precautions;Cardiopulmonary status limiting activity;Pain      OT Treatment/Interventions: Self-care/ADL training;DME and/or AE instruction;Therapeutic activities;Patient/family education;Balance training;Therapeutic exercise;Energy conservation    OT Goals(Current goals can be found in the care plan section) Acute Rehab OT Goals Patient Stated Goal: return to shagging, blue grass and playing guitar OT Goal Formulation: With patient Time For Goal Achievement: 08/04/20 Potential to Achieve Goals: Good  OT Frequency: Min 2X/week   Barriers to D/C:            Co-evaluation              AM-PAC OT "6 Clicks" Daily Activity     Outcome Measure Help from another person eating meals?: None Help from another person taking care of personal grooming?: A Little Help from another person toileting, which includes using toliet, bedpan, or urinal?: A Little Help from another person bathing (including washing, rinsing, drying)?: A Little Help from another person to put on and taking off regular upper body clothing?: A Little Help from another person to put on and taking off regular lower body clothing?: A Lot 6 Click Score: 18   End of Session Equipment Utilized During Treatment: Rolling walker;Oxygen Nurse Communication: Mobility status  Activity  Tolerance: Patient tolerated treatment well Patient left: with call bell/phone within reach;in chair;with chair alarm set;with family/visitor present  OT Visit Diagnosis: Other abnormalities of gait and mobility (R26.89);Muscle weakness (generalized) (M62.81);Pain Pain - part of body:  (L ribs)                Time: 4158-3094 OT Time Calculation (min): 20 min Charges:  OT General Charges $OT Visit: 1 Visit OT Evaluation $OT Eval Moderate Complexity: 1 Mod  Jolaine Artist, OT Acute Rehabilitation Services Pager [phone] Office [phone]   [name] 07/21/2020, 1:07 PM

## 2020-07-21 NOTE — Evaluation (Signed)
Physical Therapy Evaluation Patient Details Name: Brian Banks MRN: [id number] DOB: 08/09/1945 Today's Date: 07/21/2020   History of Present Illness  75 yo admitted 6/7 to [facility] with chest pain with NSTEMI and s/p cath transferred to [facility] with critical CAD. CABG x 4 performed 6/8. PMhx: HLD, BPH, GERD, obesity  Clinical Impression  Pt very pleasant and reports being very active at baseline with daughters who are a RN and OT who can assist at D/C as well as girlfriend who will stay with him. Pt normally lives alone and is active with Architect work, Museum/gallery exhibitions officer. Pt educated for all sternal precautions with instruction for activity progression and walking program. Pt with decreased strength, transfers and gait who will benefit from acute therapy to maximize mobility, safety and precautions to decrease burden of care.   HR 80-90 SpO2 96-100% on 3L Pre gait 118/58 (76) Post gait 127/76 (90)    Follow Up Recommendations No PT follow up    Equipment Recommendations  Rolling walker with 5" wheels;3in1 (PT)    Recommendations for Other Services       Precautions / Restrictions Precautions Precautions: Sternal Precaution Booklet Issued: Yes (comment) Precaution Comments: chest tube, pacer Restrictions Weight Bearing Restrictions: Yes (sternal precautions)      Mobility  Bed Mobility               General bed mobility comments: in chair on arrival and end of session    Transfers Overall transfer level: Needs assistance   Transfers: Sit to/from Stand Sit to Stand: Min guard         General transfer comment: cues for hand placement, anterior translation and safety  Ambulation/Gait Ambulation/Gait assistance: Min guard Gait Distance (Feet): 200 Feet Assistive device: Rolling walker (2 wheeled) Gait Pattern/deviations: Step-through pattern;Decreased stride length   Gait velocity interpretation: 1.31 - 2.62 ft/sec, indicative of limited  community ambulator General Gait Details: cues for posture and proximity to RW as well as directional cues  Financial trader [name] (Stroke Patients Only)       Balance Overall balance assessment: Mild deficits observed, not formally tested                                           Pertinent Vitals/Pain Pain Assessment: 0-10 Pain Score: 4  Pain Location: incision Pain Descriptors / Indicators: Sore Pain Intervention(s): Limited activity within patient's tolerance;Monitored during session;Repositioned    Home Living Family/patient expects to be discharged to:: Private residence Living Arrangements: Alone Available Help at Discharge: Family;Available 24 hours/day Type of Home: House Home Access: Stairs to enter Entrance Stairs-Rails: Left Entrance Stairs-Number of Steps: 2 Home Layout: Multi-level;Bed/bath upstairs Home Equipment: Walker - 4 wheels;Grab bars - tub/shower;Grab bars - toilet;Cane - single point;Crutches      Prior Function Level of Independence: Independent               Hand Dominance        Extremity/Trunk Assessment   Upper Extremity Assessment Upper Extremity Assessment: Overall WFL for tasks assessed    Lower Extremity Assessment Lower Extremity Assessment: Overall WFL for tasks assessed    Cervical / Trunk Assessment Cervical / Trunk Assessment: Normal  Communication   Communication: No difficulties  Cognition Arousal/Alertness: Awake/alert Behavior During Therapy: WFL for  tasks assessed/performed Overall Cognitive Status: Within Functional Limits for tasks assessed                                        General Comments      Exercises General Exercises - Lower Extremity Long Arc Quad: AROM;Both;15 reps;Seated Hip Flexion/Marching: AROM;Both;15 reps;Seated   Assessment/Plan    PT Assessment Patient needs continued PT services  PT Problem List  Decreased strength;Decreased mobility;Decreased activity tolerance;Decreased knowledge of use of DME;Decreased knowledge of precautions       PT Treatment Interventions Gait training;Stair training;Functional mobility training;Therapeutic activities;Patient/family education;DME instruction;Therapeutic exercise    PT Goals (Current goals can be found in the Care Plan section)  Acute Rehab PT Goals Patient Stated Goal: return to shagging, blue grass and playing guitar PT Goal Formulation: With patient/family Time For Goal Achievement: 08/04/20 Potential to Achieve Goals: Good    Frequency Min 3X/week   Barriers to discharge        Co-evaluation               AM-PAC PT "6 Clicks" Mobility  Outcome Measure Help needed turning from your back to your side while in a flat bed without using bedrails?: A Little Help needed moving from lying on your back to sitting on the side of a flat bed without using bedrails?: A Little Help needed moving to and from a bed to a chair (including a wheelchair)?: A Little Help needed standing up from a chair using your arms (e.g., wheelchair or bedside chair)?: A Little Help needed to walk in hospital room?: A Little Help needed climbing 3-5 steps with a railing? : A Lot 6 Click Score: 17    End of Session Equipment Utilized During Treatment: Gait belt Activity Tolerance: Patient tolerated treatment well Patient left: in chair;with call bell/phone within reach;with family/visitor present Nurse Communication: Mobility status;Precautions PT Visit Diagnosis: Other abnormalities of gait and mobility (R26.89);Difficulty in walking, not elsewhere classified (R26.2)    Time: 0177-9390 PT Time Calculation (min) (ACUTE ONLY): 24 min   Charges:   PT Evaluation $PT Eval Moderate Complexity: 1 Mod PT Treatments $Gait Training: 8-22 mins        Seleste Tallman P, PT Acute Rehabilitation Services Pager: [phone] Office: [place] 07/21/2020, 10:37 AM

## 2020-07-21 NOTE — Progress Notes (Signed)
      [address]       [place] [address]             [phone]      Sitting up   POD # 2 CABG x 3  No complaints  BP (!) 103/52 (BP Location: Right Arm)   Pulse 92   Temp 98.3 F (36.8 C) (Oral)   Resp 19   Ht 6\' 5"  (1.956 m)   Wt 135.5 kg   SpO2 98%   BMI 35.42 kg/m  3L [place] 94% sat   Intake/Output Summary (Last 24 hours) at 07/21/2020 1753 Last data filed at 07/21/2020 1600 Gross per 24 hour  Intake 1049.49 ml  Output 2570 ml  Net -1520.51 ml   Awaiting bed on 4E  Cabrina Shiroma C. Roxan Hockey, MD [facility] [phone]

## 2020-07-21 NOTE — Progress Notes (Signed)
2 Days Post-Op Procedure(s) (LRB): CORONARY ARTERY BYPASS GRAFTING (CABG) TIMES THREE ON PUMP USING LEFT INTERNAL MAMMARY ARTERY AND ENDOSCOPICALLY HARVESTED RIGHT GREATER SAPHENOUS VEIN (N/A) TRANSESOPHAGEAL ECHOCARDIOGRAM (TEE) (N/A) INDOCYANINE GREEN FLUORESCENCE IMAGING (ICG) (N/A) ENDOVEIN HARVEST OF GREATER SAPHENOUS VEIN (Right) Subjective: No complaints  Objective: Vital signs in last 24 hours: Temp:  [97.88 F (36.6 C)-98.9 F (37.2 C)] 98.9 F (37.2 C) (06/10 0400) Pulse Rate:  [76-93] 89 (06/10 0600) Cardiac Rhythm: Atrial paced (06/10 0600) Resp:  [15-25] 22 (06/10 0600) BP: (99-116)/(53-70) 112/67 (06/10 0600) SpO2:  [87 %-100 %] 93 % (06/10 0600) Arterial Line BP: (95-146)/(38-59) 129/46 (06/10 0500) Weight:  [135.5 kg] 135.5 kg (06/10 0445)  Hemodynamic parameters for last 24 hours: PAP: (15-26)/(7-14) 16/10 CO:  [10 L/min] 10 L/min CI:  [3.9 L/min/m2] 3.9 L/min/m2  Intake/Output from previous day: 06/09 0701 - 06/10 0700 In: 1582.3 [I.V.:1482.3; IV Piggyback:100] Out: 2110 [Urine:1285; Chest Tube:825] Intake/Output this shift: No intake/output data recorded.  General appearance: alert and cooperative Neurologic: intact Heart: regular rate and rhythm, S1, S2 normal, no murmur, click, rub or gallop Lungs: clear to auscultation bilaterally Abdomen: soft Extremities: edema 2+ Wound: dressed, dry  Lab Results: Recent Labs    07/20/20 1621 07/21/20 0445  WBC 7.2 9.0  HGB 8.4* 8.7*  HCT 24.6* 24.7*  PLT ACLMP 123*   BMET:  Recent Labs    07/20/20 1621 07/21/20 0445  NA 134* 132*  K 3.7 4.1  CL 102 102  CO2 25 25  GLUCOSE 108* 132*  BUN 14 13  CREATININE 0.92 0.88  CALCIUM 7.7* 7.8*    PT/INR:  Recent Labs    07/19/20 1338  LABPROT 15.7*  INR 1.3*   ABG    Component Value Date/Time   PHART 7.378 07/19/2020 2002   HCO3 22.4 07/19/2020 2002   TCO2 24 07/19/2020 2002   ACIDBASEDEF 2.0 07/19/2020 2002   O2SAT 93.0 07/19/2020 2002    CBG (last 3)  Recent Labs    07/20/20 1818 07/20/20 2009 07/21/20 0005  GLUCAP 198* 115* 122*    Assessment/Plan: S/P Procedure(s) (LRB): CORONARY ARTERY BYPASS GRAFTING (CABG) TIMES THREE ON PUMP USING LEFT INTERNAL MAMMARY ARTERY AND ENDOSCOPICALLY HARVESTED RIGHT GREATER SAPHENOUS VEIN (N/A) TRANSESOPHAGEAL ECHOCARDIOGRAM (TEE) (N/A) INDOCYANINE GREEN FLUORESCENCE IMAGING (ICG) (N/A) ENDOVEIN HARVEST OF GREATER SAPHENOUS VEIN (Right) Mobilize Diuresis Po amiodarone   LOS: 3 days    Wonda Olds 07/21/2020

## 2020-07-22 ENCOUNTER — Inpatient Hospital Stay (HOSPITAL_COMMUNITY): Payer: PPO

## 2020-07-22 LAB — BASIC METABOLIC PANEL
Anion gap: 7 (ref 5–15)
BUN: 19 mg/dL (ref 8–23)
CO2: 28 mmol/L (ref 22–32)
Calcium: 8 mg/dL — ABNORMAL LOW (ref 8.9–10.3)
Chloride: 101 mmol/L (ref 98–111)
Creatinine, Ser: 0.96 mg/dL (ref 0.61–1.24)
GFR, Estimated: >60 mL/min (ref >60)
Glucose, Bld: 119 mg/dL — ABNORMAL HIGH (ref 70–99)
Potassium: 4.1 mmol/L (ref 3.5–5.1)
Sodium: 136 mmol/L (ref 135–145)

## 2020-07-22 LAB — TYPE AND SCREEN
ABO/RH(D): A POS
Antibody Screen: POSITIVE
Donor AG Type: NEGATIVE
Donor AG Type: NEGATIVE
Donor AG Type: NEGATIVE
Donor AG Type: NEGATIVE
PT AG Type: NEGATIVE
Unit division: 0
Unit division: 0
Unit division: 0
Unit division: 0

## 2020-07-22 LAB — BPAM RBC
Blood Product Expiration Date: 202207072359
Blood Product Expiration Date: 202207072359
Blood Product Expiration Date: 202207072359
Blood Product Expiration Date: 202207072359
Unit Type and Rh: 6200
Unit Type and Rh: 6200
Unit Type and Rh: 6200
Unit Type and Rh: 6200

## 2020-07-22 LAB — CBC
HCT: 26.2 % — ABNORMAL LOW (ref 39.0–52.0)
Hemoglobin: 8.9 g/dL — ABNORMAL LOW (ref 13.0–17.0)
MCH: 31.4 pg (ref 26.0–34.0)
MCHC: 34 g/dL (ref 30.0–36.0)
MCV: 92.6 fL (ref 80.0–100.0)
Platelets: 145 10*3/uL — ABNORMAL LOW (ref 150–400)
RBC: 2.83 MIL/uL — ABNORMAL LOW (ref 4.22–5.81)
RDW: 13.2 % (ref 11.5–15.5)
WBC: 8.7 10*3/uL (ref 4.0–10.5)
nRBC: 0 % (ref 0.0–0.2)

## 2020-07-22 LAB — GLUCOSE, CAPILLARY
Glucose-Capillary: 111 mg/dL — ABNORMAL HIGH (ref 70–99)
Glucose-Capillary: 191 mg/dL — ABNORMAL HIGH (ref 70–99)
Glucose-Capillary: 132 mg/dL — ABNORMAL HIGH (ref 70–99)
Glucose-Capillary: 127 mg/dL — ABNORMAL HIGH (ref 70–99)
Glucose-Capillary: 172 mg/dL — ABNORMAL HIGH (ref 70–99)

## 2020-07-22 NOTE — Progress Notes (Signed)
CARDIAC REHAB PHASE I   PRE:  Rate/Rhythm: 7 SR  BP:  Sitting: 128/70      SaO2: 94 RA  MODE:  Ambulation: 400 ft   POST:  Rate/Rhythm: 90 SR  BP:  Sitting: 138/70    SaO2: 99 RA  Pt ambulated 485ft in hallway standby assist with front wheel walker. Pt denies SOB or dizziness, does endorse pain at CT site. Pt returned to recliner, demonstrating 1500 on IS. Encouraged continued ambulation and IS use. Will continue to follow.  [id number] Rufina Falco, RN BSN 07/22/2020 11:02 AM

## 2020-07-22 NOTE — Progress Notes (Signed)
Pt arrived to unit from [facility]. Oriented to room. Vss. Denies pain at this time. Placed on tele. Plan of care reviewed. No questions at this time.

## 2020-07-22 NOTE — Progress Notes (Signed)
Mobility Specialist: Progress Note   07/22/20 1436  Mobility  Activity Ambulated in hall  Level of Assistance Modified independent, requires aide device or extra time  Assistive Device Front wheel walker  Distance Ambulated (ft) 390 ft  Mobility Ambulated independently in hallway  Mobility Response Tolerated well  Mobility performed by Mobility specialist  $Mobility charge 1 Mobility   Post-Mobility: 84 HR, 123/64 BP, 97% SpO2  Pt c/o some tightness in his chest during ambulation, otherwise asx. Pt to bed after walk per request with RN present in room.   [facility] Friedrich Harriott Mobility Specialist Mobility Specialist Phone: [phone]

## 2020-07-22 NOTE — Progress Notes (Addendum)
[address]       [place] [address]             [phone]      3 Days Post-Op Procedure(s) (LRB): CORONARY ARTERY BYPASS GRAFTING (CABG) TIMES THREE ON PUMP USING LEFT INTERNAL MAMMARY ARTERY AND ENDOSCOPICALLY HARVESTED RIGHT GREATER SAPHENOUS VEIN (N/A) TRANSESOPHAGEAL ECHOCARDIOGRAM (TEE) (N/A) INDOCYANINE GREEN FLUORESCENCE IMAGING (ICG) (N/A) ENDOVEIN HARVEST OF GREATER SAPHENOUS VEIN (Right) Subjective: Some soreness  Objective: Vital signs in last 24 hours: Temp:  [98.1 F (36.7 C)-98.7 F (37.1 C)] 98.1 F (36.7 C) (06/11 0804) Pulse Rate:  [83-93] 92 (06/11 0804) Cardiac Rhythm: Atrial paced (06/10 2023) Resp:  [14-23] 18 (06/11 0804) BP: (96-122)/(52-71) 122/70 (06/11 0804) SpO2:  [93 %-98 %] 98 % (06/11 0804)  Hemodynamic parameters for last 24 hours:    Intake/Output from previous day: 06/10 0701 - 06/11 0700 In: 460.1 [P.O.:240; I.V.:220.1] Out: 4362 [STMHD:6222; Chest Tube:530] Intake/Output this shift: No intake/output data recorded.  General appearance: alert, cooperative, and no distress Heart: regular rate and rhythm Lungs: mildly dim in lower fields Abdomen: benign Extremities: no edema Wound: leg incis healing well, chest aquaseal CDI  Lab Results: Recent Labs    07/21/20 0445 07/22/20 0520  WBC 9.0 8.7  HGB 8.7* 8.9*  HCT 24.7* 26.2*  PLT 123* 145*   BMET:  Recent Labs    07/21/20 0445 07/22/20 0520  NA 132* 136  K 4.1 4.1  CL 102 101  CO2 25 28  GLUCOSE 132* 119*  BUN 13 19  CREATININE 0.88 0.96  CALCIUM 7.8* 8.0*    PT/INR:  Recent Labs    07/19/20 1338  LABPROT 15.7*  INR 1.3*   ABG    Component Value Date/Time   PHART 7.378 07/19/2020 2002   HCO3 22.4 07/19/2020 2002   TCO2 24 07/19/2020 2002   ACIDBASEDEF 2.0 07/19/2020 2002   O2SAT 93.0 07/19/2020 2002   CBG (last 3)  Recent Labs    07/21/20 2037 07/21/20 2341 07/22/20 0425  GLUCAP 174* 96 111*    Meds Scheduled Meds:   acetaminophen  1,000 mg Oral Q6H   Or   acetaminophen (TYLENOL) oral liquid 160 mg/5 mL  1,000 mg Per Tube Q6H   amiodarone  400 mg Oral BID   aspirin EC  325 mg Oral Daily   Or   aspirin  324 mg Per Tube Daily   bisacodyl  10 mg Oral Daily   Or   bisacodyl  10 mg Rectal Daily   Chlorhexidine Gluconate Cloth  6 each Topical Daily   colchicine  0.3 mg Oral BID   docusate sodium  200 mg Oral Daily   ferrous [id number] C-folic acid  1 capsule Oral BID PC   finasteride  5 mg Oral Q supper   furosemide  40 mg Intravenous BID   insulin aspart  0-24 Units Subcutaneous Q4H   mouth rinse  15 mL Mouth Rinse BID   metoprolol tartrate  12.5 mg Oral BID   Or   metoprolol tartrate  12.5 mg Per Tube BID   pantoprazole  40 mg Oral Daily   potassium chloride  20 mEq Oral BID   rosuvastatin  20 mg Oral Daily   Continuous Infusions:  lactated ringers     PRN Meds:.dextrose, lactated ringers, levalbuterol, metoprolol tartrate, morphine injection, ondansetron (ZOFRAN) IV, oxyCODONE, traMADol  Xrays DG Chest Port 1 View  Result Date: 07/21/2020 CLINICAL DATA:  Status post  cardiac surgery. EXAM: PORTABLE CHEST 1 VIEW COMPARISON:  July 20, 2020. FINDINGS: Stable cardiomegaly. Sternotomy wires are noted. Swan-Ganz catheter has been removed. Stable left-sided chest tube is noted without pneumothorax. Stable bilateral pulmonary passed CT's are noted, left greater than right, concerning for atelectasis or possibly edema. Bony thorax is unremarkable. IMPRESSION: Stable left-sided chest tube without pneumothorax. Stable bilateral lung opacities as described above. Electronically Signed   By: Marijo Conception M.D.   On: 07/21/2020 08:07    Assessment/Plan: S/P Procedure(s) (LRB): CORONARY ARTERY BYPASS GRAFTING (CABG) TIMES THREE ON PUMP USING LEFT INTERNAL MAMMARY ARTERY AND ENDOSCOPICALLY HARVESTED RIGHT GREATER SAPHENOUS VEIN (N/A) TRANSESOPHAGEAL ECHOCARDIOGRAM (TEE) (N/A) INDOCYANINE GREEN  FLUORESCENCE IMAGING (ICG) (N/A) ENDOVEIN HARVEST OF GREATER SAPHENOUS VEIN (Right)  1 afeb, VSS, on po amio, don't see documentation of afib but must have had at some point post op 2 sats good on 3 liters 3 CT 530 cc  4 good UOP- not weighed today yet, normal renal fxn, cont diuresis, will be able to decrease soon 5 expected ABLA improved with equilibration 5 reasonable control- not a diabetic  6 CXR  small left effusion, improved aeration in lower fields 7 d/c central line 8 d/c foley 9 disconnect pacer- sinus in 80's underneath 10 routine pulm toilet/rehab 11 poss start plavix soon- chest tubes still in place   LOS: 4 days    John Giovanni 07/22/2020   Patient seen and examined, agree with above On amiodarone for frequent PVCs Agree with assessment and plan as outlined above  Remo Lipps C. Roxan Hockey, MD [facility] [phone]

## 2020-07-22 NOTE — Progress Notes (Signed)
Removed central line, Patient tolerated well, VSS, will continue to monitor  Chrisandra Carota, RN

## 2020-07-23 LAB — GLUCOSE, CAPILLARY
Glucose-Capillary: 107 mg/dL — ABNORMAL HIGH (ref 70–99)
Glucose-Capillary: 108 mg/dL — ABNORMAL HIGH (ref 70–99)
Glucose-Capillary: 111 mg/dL — ABNORMAL HIGH (ref 70–99)
Glucose-Capillary: 132 mg/dL — ABNORMAL HIGH (ref 70–99)

## 2020-07-23 MED ORDER — FUROSEMIDE 40 MG PO TABS
40.0000 mg | ORAL_TABLET | Freq: Every day | ORAL | Status: DC
  Administered 2020-07-23 – 2020-07-24 (×2): 40 mg via ORAL
  Filled 2020-07-23 (×2): qty 1

## 2020-07-23 MED ORDER — ASPIRIN 81 MG PO CHEW
81.0000 mg | CHEWABLE_TABLET | Freq: Every day | ORAL | Status: DC
  Administered 2020-07-24: 81 mg
  Filled 2020-07-23: qty 1

## 2020-07-23 MED ORDER — ASPIRIN EC 81 MG PO TBEC: 81.0000 mg | DELAYED_RELEASE_TABLET | Freq: Every day | ORAL | Status: DC

## 2020-07-23 MED ORDER — POTASSIUM CHLORIDE CRYS ER 20 MEQ PO TBCR
20.0000 meq | EXTENDED_RELEASE_TABLET | Freq: Every day | ORAL | Status: DC
  Administered 2020-07-23 – 2020-07-24 (×2): 20 meq via ORAL
  Filled 2020-07-23 (×2): qty 1

## 2020-07-23 MED ORDER — CLOPIDOGREL BISULFATE 75 MG PO TABS
75.0000 mg | ORAL_TABLET | Freq: Every day | ORAL | Status: DC
  Administered 2020-07-23 – 2020-07-24 (×2): 75 mg via ORAL
  Filled 2020-07-23 (×2): qty 1

## 2020-07-23 NOTE — Progress Notes (Signed)
Mobility Specialist: Progress Note   07/23/20 1102  Mobility  Activity Ambulated in hall  Level of Assistance Modified independent, requires aide device or extra time  Assistive Device Front wheel walker  Distance Ambulated (ft) 470 ft  Mobility Ambulated independently in hallway  Mobility Response Tolerated well  Mobility performed by Mobility specialist  $Mobility charge 1 Mobility   Pre-Mobility: 72 HR, 100% SpO2 Post-Mobility: 80 HR  Pt c/o some soreness at his chest tube site during ambulation, otherwise asx. Pt is set-up at the sink with the NT present in the room to wash up and brush his teeth. Encouraged pt to walk two more times today.   [facility] Brian Banks Mobility Specialist Mobility Specialist Phone: [phone]

## 2020-07-23 NOTE — Progress Notes (Addendum)
[address]       [place] [address]             [phone]      4 Days Post-Op Procedure(s) (LRB): CORONARY ARTERY BYPASS GRAFTING (CABG) TIMES THREE ON PUMP USING LEFT INTERNAL MAMMARY ARTERY AND ENDOSCOPICALLY HARVESTED RIGHT GREATER SAPHENOUS VEIN (N/A) TRANSESOPHAGEAL ECHOCARDIOGRAM (TEE) (N/A) INDOCYANINE GREEN FLUORESCENCE IMAGING (ICG) (N/A) ENDOVEIN HARVEST OF GREATER SAPHENOUS VEIN (Right) Subjective: Conts to feel better, getting stronger  Objective: Vital signs in last 24 hours: Temp:  [98 F (36.7 C)-99 F (37.2 C)] 98.2 F (36.8 C) (06/12 0400) Pulse Rate:  [84-92] 84 (06/11 1309) Cardiac Rhythm: Normal sinus rhythm;Heart block;Bundle branch block (06/11 2100) Resp:  [18-20] 19 (06/12 0400) BP: (93-124)/(54-70) 124/60 (06/12 0400) SpO2:  [93 %-99 %] 99 % (06/12 0400) Weight:  [123.5 kg] 123.5 kg (06/12 0458)  Hemodynamic parameters for last 24 hours:    Intake/Output from previous day: 06/11 0701 - 06/12 0700 In: 554 [P.O.:554] Out: 4595 [Urine:3345; Chest Tube:1250] Intake/Output this shift: No intake/output data recorded.  General appearance: alert, cooperative, and no distress Heart: regular rate and rhythm and + rub with CT's in place Lungs: some scattered ronchi Abdomen: benign Extremities: minimal edema Wound: evh site ok, aquaseal to be removed today  Lab Results: Recent Labs    07/21/20 0445 07/22/20 0520  WBC 9.0 8.7  HGB 8.7* 8.9*  HCT 24.7* 26.2*  PLT 123* 145*   BMET:  Recent Labs    07/21/20 0445 07/22/20 0520  NA 132* 136  K 4.1 4.1  CL 102 101  CO2 25 28  GLUCOSE 132* 119*  BUN 13 19  CREATININE 0.88 0.96  CALCIUM 7.8* 8.0*    PT/INR: No results for input(s): LABPROT, INR in the last 72 hours. ABG    Component Value Date/Time   PHART 7.378 07/19/2020 2002   HCO3 22.4 07/19/2020 2002   TCO2 24 07/19/2020 2002   ACIDBASEDEF 2.0 07/19/2020 2002   O2SAT 93.0 07/19/2020 2002   CBG (last  3)  Recent Labs    07/22/20 2057 07/23/20 0023 07/23/20 0452  GLUCAP 172* 108* 111*    Meds Scheduled Meds:  acetaminophen  1,000 mg Oral Q6H   Or   acetaminophen (TYLENOL) oral liquid 160 mg/5 mL  1,000 mg Per Tube Q6H   amiodarone  400 mg Oral BID   aspirin EC  325 mg Oral Daily   Or   aspirin  324 mg Per Tube Daily   bisacodyl  10 mg Oral Daily   Or   bisacodyl  10 mg Rectal Daily   Chlorhexidine Gluconate Cloth  6 each Topical Daily   colchicine  0.3 mg Oral BID   docusate sodium  200 mg Oral Daily   ferrous [id number] C-folic acid  1 capsule Oral BID PC   finasteride  5 mg Oral Q supper   furosemide  40 mg Intravenous BID   insulin aspart  0-24 Units Subcutaneous Q4H   mouth rinse  15 mL Mouth Rinse BID   metoprolol tartrate  12.5 mg Oral BID   Or   metoprolol tartrate  12.5 mg Per Tube BID   pantoprazole  40 mg Oral Daily   potassium chloride  20 mEq Oral BID   rosuvastatin  20 mg Oral Daily   Continuous Infusions:  lactated ringers     PRN Meds:.dextrose, lactated ringers, levalbuterol, metoprolol tartrate, morphine injection, ondansetron (ZOFRAN) IV, oxyCODONE,  traMADol  Xrays DG Chest Port 1 View  Result Date: 07/22/2020 CLINICAL DATA:  Status post median sternotomy EXAM: PORTABLE CHEST 1 VIEW COMPARISON:  07/21/2020 FINDINGS: Right IJ Cordis sheath is unchanged. Prior median sternotomy. Midline trachea. Mild cardiomegaly. Small left pleural effusion is similar, given differences in technique. No pneumothorax. Improved interstitial edema with mild venous congestion remaining. Left greater than right base airspace disease is not significantly changed. IMPRESSION: Slightly improved aeration, attributed to it improved interstitial edema. Persistent left pleural effusion with bibasilar Airspace disease, likely atelectasis. Electronically Signed   By: Abigail Miyamoto M.D.   On: 07/22/2020 09:53    Assessment/Plan: S/P Procedure(s) (LRB): CORONARY ARTERY  BYPASS GRAFTING (CABG) TIMES THREE ON PUMP USING LEFT INTERNAL MAMMARY ARTERY AND ENDOSCOPICALLY HARVESTED RIGHT GREATER SAPHENOUS VEIN (N/A) TRANSESOPHAGEAL ECHOCARDIOGRAM (TEE) (N/A) INDOCYANINE GREEN FLUORESCENCE IMAGING (ICG) (N/A) ENDOVEIN HARVEST OF GREATER SAPHENOUS VEIN (Right)  1 afeb, VSS , sBP 90's -120's, sinus with PVC's/BBB- should risk stratify amiodarone for PVC supression without PVC induced cardiomyopathy or structural valvular/heart dz. 2 sats good on RA 3 CT 430 y3sterday but only 80 in last 12 hours- will remove tubes today. Serosanguinous drainage 4 no new labs 5 CBG well controlled 6 repeat labs and CXR in am 7 excellent UOP- will decrease lasix and prob stop soon 8 d/c wires  9 routine progression/pulm toilet    LOS: 5 days    John Giovanni PA-C Pager [phone] 07/23/2020   Patient seen and examined, agree with assessment and plan as outlined above Will defer decision on amiodarone to Dr. Orvan Seen as the attending physician  Revonda Standard. Roxan Hockey, MD [facility] [phone]

## 2020-07-23 NOTE — Progress Notes (Signed)
Chest tubes removed, pt tolerated well, VSS, bedrest up at 1330.   Chrisandra Carota, RN

## 2020-07-23 NOTE — Progress Notes (Signed)
Epicardial pacing wires removed, Pt tolerated well, VSS, will continue to monitor. Bedrest intiaited.   Chrisandra Carota, RN

## 2020-07-24 ENCOUNTER — Inpatient Hospital Stay (HOSPITAL_COMMUNITY): Payer: PPO

## 2020-07-24 LAB — CBC
HCT: 26.8 % — ABNORMAL LOW (ref 39.0–52.0)
Hemoglobin: 9.2 g/dL — ABNORMAL LOW (ref 13.0–17.0)
MCH: 31.3 pg (ref 26.0–34.0)
MCHC: 34.3 g/dL (ref 30.0–36.0)
MCV: 91.2 fL (ref 80.0–100.0)
Platelets: 231 10*3/uL (ref 150–400)
RBC: 2.94 MIL/uL — ABNORMAL LOW (ref 4.22–5.81)
RDW: 12.8 % (ref 11.5–15.5)
WBC: 8.2 10*3/uL (ref 4.0–10.5)
nRBC: 0 % (ref 0.0–0.2)

## 2020-07-24 LAB — BASIC METABOLIC PANEL
Anion gap: 9 (ref 5–15)
BUN: 25 mg/dL — ABNORMAL HIGH (ref 8–23)
CO2: 24 mmol/L (ref 22–32)
Calcium: 8.2 mg/dL — ABNORMAL LOW (ref 8.9–10.3)
Chloride: 103 mmol/L (ref 98–111)
Creatinine, Ser: 0.95 mg/dL (ref 0.61–1.24)
GFR, Estimated: >60 mL/min (ref >60)
Glucose, Bld: 110 mg/dL — ABNORMAL HIGH (ref 70–99)
Potassium: 3.9 mmol/L (ref 3.5–5.1)
Sodium: 136 mmol/L (ref 135–145)

## 2020-07-24 MED ORDER — ASPIRIN 81 MG PO TBEC: 81.0000 mg | DELAYED_RELEASE_TABLET | Freq: Every day | ORAL | Status: AC

## 2020-07-24 MED ORDER — COLCHICINE 0.6 MG PO TABS: 0.3000 mg | ORAL_TABLET | Freq: Every day | ORAL | 0 refills | Status: DC

## 2020-07-24 MED ORDER — AMIODARONE HCL 200 MG PO TABS: 200.0000 mg | ORAL_TABLET | Freq: Two times a day (BID) | ORAL | 1 refills | Status: DC

## 2020-07-24 MED ORDER — TRAMADOL HCL 50 MG PO TABS: 50.0000 mg | ORAL_TABLET | Freq: Four times a day (QID) | ORAL | 0 refills | Status: AC | PRN

## 2020-07-24 MED ORDER — METOPROLOL TARTRATE 25 MG PO TABS: 12.5000 mg | ORAL_TABLET | Freq: Two times a day (BID) | ORAL | 1 refills | Status: DC

## 2020-07-24 MED ORDER — CLOPIDOGREL BISULFATE 75 MG PO TABS: 75.0000 mg | ORAL_TABLET | Freq: Every day | ORAL | 1 refills | Status: DC

## 2020-07-24 MED ORDER — FE FUMARATE-B12-VIT C-FA-IFC PO CAPS: 1.0000 | ORAL_CAPSULE | Freq: Two times a day (BID) | ORAL | 2 refills | Status: DC

## 2020-07-24 MED ORDER — ROSUVASTATIN CALCIUM 20 MG PO TABS: 20.0000 mg | ORAL_TABLET | Freq: Every day | ORAL | 1 refills | Status: DC

## 2020-07-24 NOTE — Progress Notes (Signed)
[address]       [place] [address]             [phone]      5 Days Post-Op Procedure(s) (LRB): CORONARY ARTERY BYPASS GRAFTING (CABG) TIMES THREE ON PUMP USING LEFT INTERNAL MAMMARY ARTERY AND ENDOSCOPICALLY HARVESTED RIGHT GREATER SAPHENOUS VEIN (N/A) TRANSESOPHAGEAL ECHOCARDIOGRAM (TEE) (N/A) INDOCYANINE GREEN FLUORESCENCE IMAGING (ICG) (N/A) ENDOVEIN HARVEST OF GREATER SAPHENOUS VEIN (Right) Subjective: Feels well  Objective: Vital signs in last 24 hours: Temp:  [97.8 F (36.6 C)-98.2 F (36.8 C)] 98 F (36.7 C) (06/13 0359) Pulse Rate:  [71-93] 80 (06/13 0359) Cardiac Rhythm: Heart block;Bundle branch block (06/12 1900) Resp:  [17-20] 19 (06/13 0359) BP: (108-130)/(46-62) 109/57 (06/13 0359) SpO2:  [98 %-100 %] 98 % (06/13 0359) Weight:  [121.4 kg] 121.4 kg (06/13 0359)  Hemodynamic parameters for last 24 hours:    Intake/Output from previous day: 06/12 0701 - 06/13 0700 In: 472 [P.O.:472] Out: 300 [Urine:300] Intake/Output this shift: No intake/output data recorded.  General appearance: alert, cooperative, and no distress Heart: regular rate and rhythm Lungs: clear to auscultation bilaterally Abdomen: benign Extremities: no edema Wound: incis healing well  Lab Results: Recent Labs    07/22/20 0520 07/24/20 0337  WBC 8.7 8.2  HGB 8.9* 9.2*  HCT 26.2* 26.8*  PLT 145* 231   BMET:  Recent Labs    07/22/20 0520 07/24/20 0337  NA 136 136  K 4.1 3.9  CL 101 103  CO2 28 24  GLUCOSE 119* 110*  BUN 19 25*  CREATININE 0.96 0.95  CALCIUM 8.0* 8.2*    PT/INR: No results for input(s): LABPROT, INR in the last 72 hours. ABG    Component Value Date/Time   PHART 7.378 07/19/2020 2002   HCO3 22.4 07/19/2020 2002   TCO2 24 07/19/2020 2002   ACIDBASEDEF 2.0 07/19/2020 2002   O2SAT 93.0 07/19/2020 2002   CBG (last 3)  Recent Labs    07/23/20 0023 07/23/20 0452 07/23/20 1145  GLUCAP 108* 111* 107*    Meds Scheduled  Meds:  acetaminophen  1,000 mg Oral Q6H   Or   acetaminophen (TYLENOL) oral liquid 160 mg/5 mL  1,000 mg Per Tube Q6H   amiodarone  400 mg Oral BID   aspirin EC  81 mg Oral Daily   Or   aspirin  81 mg Per Tube Daily   bisacodyl  10 mg Oral Daily   Or   bisacodyl  10 mg Rectal Daily   Chlorhexidine Gluconate Cloth  6 each Topical Daily   clopidogrel  75 mg Oral Daily   colchicine  0.3 mg Oral BID   docusate sodium  200 mg Oral Daily   ferrous [id number] C-folic acid  1 capsule Oral BID PC   finasteride  5 mg Oral Q supper   furosemide  40 mg Oral Daily   mouth rinse  15 mL Mouth Rinse BID   metoprolol tartrate  12.5 mg Oral BID   Or   metoprolol tartrate  12.5 mg Per Tube BID   pantoprazole  40 mg Oral Daily   potassium chloride  20 mEq Oral Daily   rosuvastatin  20 mg Oral Daily   Continuous Infusions:  lactated ringers     PRN Meds:.dextrose, lactated ringers, levalbuterol, metoprolol tartrate, morphine injection, ondansetron (ZOFRAN) IV, oxyCODONE, traMADol  Xrays No results found.  Assessment/Plan: S/P Procedure(s) (LRB): CORONARY ARTERY BYPASS GRAFTING (CABG) TIMES THREE ON PUMP USING  LEFT INTERNAL MAMMARY ARTERY AND ENDOSCOPICALLY HARVESTED RIGHT GREATER SAPHENOUS VEIN (N/A) TRANSESOPHAGEAL ECHOCARDIOGRAM (TEE) (N/A) INDOCYANINE GREEN FLUORESCENCE IMAGING (ICG) (N/A) ENDOVEIN HARVEST OF GREATER SAPHENOUS VEIN (Right)  1 afeb, VSS 2 sats excellent on RA 3 labs very stable 4 stable for d/c       LOS: 6 days    John Giovanni PA-C Pager [phone] 07/24/2020

## 2020-07-24 NOTE — Progress Notes (Signed)
Occupational Therapy Treatment Patient Details Name: Brian Banks MRN: [id number] DOB: Apr 07, 1945 Today's Date: 07/24/2020    History of present illness 75 yo admitted 6/7 to [facility] with chest pain with NSTEMI and s/p cath transferred to [facility] with critical CAD. CABG x 4 performed 6/8. PMhx: HLD, BPH, GERD, obesity   OT comments  Patient progressing well towards OT goals. Demonstrating ability to complete transfers and in room mobility without AD and modified independence, ADLs with supervision and good recall of sternal precautions.  Simulated shower transfer with supervision. Plan to dc home with family support today.    Follow Up Recommendations  No OT follow up;Supervision - Intermittent    Equipment Recommendations  3 in 1 bedside commode    Recommendations for Other Services      Precautions / Restrictions Precautions Precautions: Sternal Precaution Booklet Issued: Yes (comment) Restrictions Other Position/Activity Restrictions: sternal       Mobility Bed Mobility Overal bed mobility: Modified Independent                  Transfers Overall transfer level: Modified independent                    Balance Overall balance assessment: Mild deficits observed, not formally tested                                         ADL either performed or assessed with clinical judgement   ADL Overall ADL's : Needs assistance/impaired     Grooming: Modified independent;Standing           Upper Body Dressing : Set up;Sitting   Lower Body Dressing: Set up;Supervision/safety;Sit to/from stand   Toilet Transfer: Modified Independent;Ambulation   Toileting- Clothing Manipulation and Hygiene: Modified independent;Sit to/from stand   Tub/ Banker: Walk-in shower;Modified independent;Ambulation;Grab bars   Functional mobility during ADLs: Modified independent General ADL Comments: pt progressing well towards OT goals      Vision       Perception     Praxis      Cognition Arousal/Alertness: Awake/alert Behavior During Therapy: WFL for tasks assessed/performed Overall Cognitive Status: Within Functional Limits for tasks assessed                                          Exercises     Shoulder Instructions       General Comments      Pertinent Vitals/ Pain       Pain Assessment: No/denies pain  Home Living                                          Prior Functioning/Environment              Frequency  Min 2X/week        Progress Toward Goals  OT Goals(current goals can now be found in the care plan section)  Progress towards OT goals: Progressing toward goals  Acute Rehab OT Goals Patient Stated Goal: return to shagging, blue grass and playing guitar OT Goal Formulation: With patient  Plan Discharge plan remains appropriate;Frequency remains appropriate    Co-evaluation  AM-PAC OT "6 Clicks" Daily Activity     Outcome Measure   Help from another person eating meals?: None Help from another person taking care of personal grooming?: None Help from another person toileting, which includes using toliet, bedpan, or urinal?: A Little Help from another person bathing (including washing, rinsing, drying)?: A Little Help from another person to put on and taking off regular upper body clothing?: A Little Help from another person to put on and taking off regular lower body clothing?: A Little 6 Click Score: 20    End of Session    OT Visit Diagnosis: Other abnormalities of gait and mobility (R26.89);Muscle weakness (generalized) (M62.81);Pain   Activity Tolerance Patient tolerated treatment well   Patient Left with call bell/phone within reach;Other (comment) (seated EOB)   Nurse Communication Mobility status        Time: 8676-1950 OT Time Calculation (min): 15 min  Charges: OT General Charges $OT Visit: 1  Visit OT Treatments $Self Care/Home Management : 8-22 mins  Jolaine Artist, OT Acute Rehabilitation Services Pager [phone] Office [phone]    [name] 07/24/2020, 10:29 AM

## 2020-07-24 NOTE — Progress Notes (Signed)
CARDIAC REHAB PHASE I   D/c education completed with pt. Pt educated on importance of site care and monitoring incisions daily. Encouraged continued IS use, walks, and sternal precautions. Pt given in-the-tube sheet along with heart healthy and diabetic diets. Reviewed restrictions and exercise guidelines. Will refer to CRP II [place].  [id number] Rufina Falco, RN BSN 07/24/2020 9:27 AM

## 2020-07-24 NOTE — Care Management Important Message (Signed)
Important Message  Patient Details  Name: Brian Banks MRN: [id number] Date of Birth: 12-01-1945   Medicare Important Message Given:  Yes Pt. D/C mailed IM letter to patient home address.    Holli Humbles Smith 07/24/2020, 10:34 AM

## 2020-07-24 NOTE — Progress Notes (Signed)
Pt discharged per MD order. IV and tele removed. Discharge instructions reviewed with pt and family. Pt and family verbalized understanding and have no further questions. Pt escorted to personal vehicle.   Mandee Pluta M

## 2020-07-24 NOTE — Plan of Care (Signed)

## 2020-07-26 ENCOUNTER — Other Ambulatory Visit: Payer: Self-pay | Admitting: Cardiothoracic Surgery

## 2020-07-26 DIAGNOSIS — Z951 Presence of aortocoronary bypass graft: Secondary | ICD-10-CM

## 2020-07-27 ENCOUNTER — Ambulatory Visit: Payer: Self-pay | Admitting: Cardiothoracic Surgery

## 2020-08-01 ENCOUNTER — Other Ambulatory Visit: Payer: Self-pay

## 2020-08-01 ENCOUNTER — Encounter: Payer: Self-pay | Admitting: Cardiothoracic Surgery

## 2020-08-01 ENCOUNTER — Ambulatory Visit (INDEPENDENT_AMBULATORY_CARE_PROVIDER_SITE_OTHER): Payer: Self-pay | Admitting: Cardiothoracic Surgery

## 2020-08-01 ENCOUNTER — Ambulatory Visit
Admission: RE | Admit: 2020-08-01 | Discharge: 2020-08-01 | Disposition: A | Discharge status: Home / Self Care | Payer: PPO | Source: Ambulatory Visit | Attending: Cardiothoracic Surgery | Admitting: Cardiothoracic Surgery

## 2020-08-01 VITALS — BP 126/74 | HR 81 | Resp 20 | Ht 77.0 in | Wt 272.0 lb

## 2020-08-01 DIAGNOSIS — Z951 Presence of aortocoronary bypass graft: Secondary | ICD-10-CM

## 2020-08-01 DIAGNOSIS — Z9889 Other specified postprocedural states: Secondary | ICD-10-CM | POA: Diagnosis not present

## 2020-08-02 NOTE — Progress Notes (Signed)
      [address]       [place] [address]             [phone]     CARDIOTHORACIC SURGERY OFFICE NOTE  Referring Provider is Wellington Hampshire, MD Primary Cardiologist is [name], MD PCP is Adin Hector, MD   HPI:  75 year old man underwent coronary bypass grafting 2 weeks ago.  He did very well after surgery and was discharged a few days later.  He presents for his first postoperative visit.  He denies any chest pain or fevers and chills at home.  He has been increasing his ambulation and his diet and appetite are good.   Current Outpatient Medications  Medication Sig Dispense Refill   amiodarone (PACERONE) 200 MG tablet Take 1 tablet (200 mg total) by mouth 2 (two) times daily. 60 tablet 1   aspirin EC 81 MG EC tablet Take 1 tablet (81 mg total) by mouth daily. Swallow whole.     clopidogrel (PLAVIX) 75 MG tablet Take 1 tablet (75 mg total) by mouth daily. 30 tablet 1   colchicine 0.6 MG tablet Take 0.5 tablets (0.3 mg total) by mouth daily. 15 tablet 0   ferrous [id number] C-folic acid (TRINSICON / FOLTRIN) capsule Take 1 capsule by mouth 2 (two) times daily after a meal. 60 capsule 2   finasteride (PROSCAR) 5 MG tablet Take 1 tablet (5 mg total) by mouth daily with supper.     metoprolol tartrate (LOPRESSOR) 25 MG tablet Take 0.5 tablets (12.5 mg total) by mouth 2 (two) times daily. 30 tablet 1   omeprazole (PRILOSEC) 20 MG capsule Take 20 mg by mouth daily with supper.     rosuvastatin (CRESTOR) 20 MG tablet Take 1 tablet (20 mg total) by mouth daily. 30 tablet 1   No current facility-administered medications for this visit.      Physical Exam:   BP 126/74 (BP Location: Left Arm, Patient Position: Sitting, Cuff Size: Normal)   Pulse 81   Resp 20   Ht 6\' 5"  (1.956 m)   Wt 123.4 kg   SpO2 96% Comment: RA  BMI 32.25 kg/m   General:  Well-appearing no acute distress  Chest:   Clear to auscultation bilaterally except at left  base  CV:   Regular rate and rhythm  Incisions:  Healing well  Abdomen:  Soft nontender  Extremities:  No edema  Diagnostic Tests:  Chest x-ray from today demonstrates a very small left pleural effusion otherwise clear lung fields and stable mediastinum   Impression:  Doing well after CABG  Plan:  Follow-up with cardiology as scheduled Follow follow-up with thoracic surgery as needed Okay to drive in 1 week   I spent in excess of 15 minutes during the conduct of this office consultation and >50% of this time involved direct face-to-face encounter with the patient for counseling and/or coordination of their care.  Level 2                 10 minutes Level 3                 15 minutes Level 4                 25 minutes Level 5                 40 minutes  B.  Murvin Natal, MD 08/02/2020 3:42 PM

## 2020-08-08 DIAGNOSIS — I2510 Atherosclerotic heart disease of native coronary artery without angina pectoris: Secondary | ICD-10-CM | POA: Diagnosis not present

## 2020-08-08 DIAGNOSIS — K219 Gastro-esophageal reflux disease without esophagitis: Secondary | ICD-10-CM | POA: Diagnosis not present

## 2020-08-08 DIAGNOSIS — R739 Hyperglycemia, unspecified: Secondary | ICD-10-CM | POA: Diagnosis not present

## 2020-08-08 DIAGNOSIS — E7849 Other hyperlipidemia: Secondary | ICD-10-CM | POA: Diagnosis not present

## 2020-08-11 ENCOUNTER — Ambulatory Visit: Payer: PPO | Admitting: Physician Assistant

## 2020-08-11 ENCOUNTER — Other Ambulatory Visit: Payer: Self-pay

## 2020-08-11 ENCOUNTER — Encounter: Payer: Self-pay | Admitting: Physician Assistant

## 2020-08-11 VITALS — BP 108/60 | HR 77 | Ht 77.0 in | Wt 272.0 lb

## 2020-08-11 DIAGNOSIS — I10 Essential (primary) hypertension: Secondary | ICD-10-CM

## 2020-08-11 DIAGNOSIS — I4510 Unspecified right bundle-branch block: Secondary | ICD-10-CM | POA: Diagnosis not present

## 2020-08-11 DIAGNOSIS — I252 Old myocardial infarction: Secondary | ICD-10-CM | POA: Diagnosis not present

## 2020-08-11 DIAGNOSIS — I491 Atrial premature depolarization: Secondary | ICD-10-CM | POA: Diagnosis not present

## 2020-08-11 DIAGNOSIS — Z951 Presence of aortocoronary bypass graft: Secondary | ICD-10-CM | POA: Diagnosis not present

## 2020-08-11 DIAGNOSIS — R7989 Other specified abnormal findings of blood chemistry: Secondary | ICD-10-CM

## 2020-08-11 DIAGNOSIS — I712 Thoracic aortic aneurysm, without rupture, unspecified: Secondary | ICD-10-CM

## 2020-08-11 DIAGNOSIS — I2510 Atherosclerotic heart disease of native coronary artery without angina pectoris: Secondary | ICD-10-CM

## 2020-08-11 DIAGNOSIS — I440 Atrioventricular block, first degree: Secondary | ICD-10-CM | POA: Diagnosis not present

## 2020-08-11 DIAGNOSIS — J90 Pleural effusion, not elsewhere classified: Secondary | ICD-10-CM

## 2020-08-11 DIAGNOSIS — I6523 Occlusion and stenosis of bilateral carotid arteries: Secondary | ICD-10-CM

## 2020-08-11 DIAGNOSIS — D649 Anemia, unspecified: Secondary | ICD-10-CM

## 2020-08-11 DIAGNOSIS — Z79899 Other long term (current) drug therapy: Secondary | ICD-10-CM | POA: Diagnosis not present

## 2020-08-11 DIAGNOSIS — K219 Gastro-esophageal reflux disease without esophagitis: Secondary | ICD-10-CM | POA: Diagnosis not present

## 2020-08-11 DIAGNOSIS — E785 Hyperlipidemia, unspecified: Secondary | ICD-10-CM | POA: Diagnosis not present

## 2020-08-11 DIAGNOSIS — Z789 Other specified health status: Secondary | ICD-10-CM

## 2020-08-11 DIAGNOSIS — I5189 Other ill-defined heart diseases: Secondary | ICD-10-CM | POA: Diagnosis not present

## 2020-08-11 DIAGNOSIS — I493 Ventricular premature depolarization: Secondary | ICD-10-CM

## 2020-08-11 DIAGNOSIS — Z87891 Personal history of nicotine dependence: Secondary | ICD-10-CM

## 2020-08-11 DIAGNOSIS — R7309 Other abnormal glucose: Secondary | ICD-10-CM

## 2020-08-11 MED ORDER — PANTOPRAZOLE SODIUM 40 MG PO TBEC: 40.0000 mg | DELAYED_RELEASE_TABLET | Freq: Every day | ORAL | 11 refills | Status: DC

## 2020-08-11 MED ORDER — METOPROLOL SUCCINATE ER 25 MG PO TB24: 25.0000 mg | ORAL_TABLET | Freq: Every day | ORAL | 3 refills | Status: DC

## 2020-08-11 MED ORDER — CLOPIDOGREL BISULFATE 75 MG PO TABS: 75.0000 mg | ORAL_TABLET | Freq: Every day | ORAL | 3 refills | Status: DC

## 2020-08-11 NOTE — Progress Notes (Signed)
Office Visit    Patient Name: Brian Banks Date of Encounter: 08/11/2020  PCP:  Adin Hector, MD   [place]  Cardiologist:  [name], MD  Advanced Practice Provider:  No care team member to display Electrophysiologist:  None   Chief Complaint    Chief Complaint  Patient presents with   Follow-up    Follow up for CABG. Medications verbally reviewed with patient.     75 y.o. 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  Past Medical History    Past Medical History:  Diagnosis Date   Benign neoplasm of large bowel    BPH with obstruction/lower urinary tract symptoms    Dysphagia    Erectile dysfunction    Heartburn    History of kidney stones    History of stomach ulcers    HLD (hyperlipidemia)    Hypogonadism in male    Obesity    Urinary frequency    Urinary urgency    Vitamin D deficiency    Past Surgical History:  Procedure Laterality Date   APPENDECTOMY     CORONARY ARTERY BYPASS GRAFT N/A 07/19/2020   Procedure: CORONARY ARTERY BYPASS GRAFTING (CABG) TIMES THREE ON PUMP USING LEFT INTERNAL MAMMARY ARTERY AND ENDOSCOPICALLY HARVESTED RIGHT GREATER SAPHENOUS VEIN;  Surgeon: Wonda Olds, MD;  Location: [place];  Service: Open Heart Surgery;  Laterality: N/A;   ENDOVEIN HARVEST OF GREATER SAPHENOUS VEIN Right 07/19/2020   Procedure: ENDOVEIN HARVEST OF GREATER SAPHENOUS VEIN;  Surgeon: Wonda Olds, MD;  Location: [place];  Service: Open Heart Surgery;  Laterality: Right;   LEFT HEART CATH AND CORONARY  ANGIOGRAPHY N/A 07/18/2020   Procedure: LEFT HEART CATH AND CORONARY ANGIOGRAPHY;  Surgeon: Wellington Hampshire, MD;  Location: [place] [facility];  Service: Cardiovascular;  Laterality: N/A;   TEE WITHOUT CARDIOVERSION N/A 07/19/2020   Procedure: TRANSESOPHAGEAL ECHOCARDIOGRAM (TEE);  Surgeon: Wonda Olds, MD;  Location: [place];  Service: Open Heart Surgery;  Laterality: N/A;    Allergies  Allergies  Allergen Reactions   Lovastatin    Androgel [Testosterone] Rash    History of Present Illness    [name] is a 75 y.o. 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   Prior to his recent 07/2020 [facility] admission, he had no known history of CAD.  On 07/18/2020, he experienced substernal chest pain or a feeling of tightness that radiated to his left arm while unloading some items from his truck.  Associated symptoms included diaphoresis and nausea.  CP continued on and off for approximately 2 hours.  In the [facility] ED, he was CP free and EKG showed NSR with RBBB and PVCs. HS Tn 20  70.  LDL 117, total cholesterol 179.  He was started on Crestor 20 mg daily with  previous reported history of statin intolerance.     He underwent LHC 07/18/2020 with severe 2v CAD, specifically moderate LM disease and critical ostial LAD disease with 2 other lesions in the proximal segment and significant ostial left PDA disease.  The takeoff from the left main did not allow for stenting of the LAD without jailing the large dominant left circumflex. Mildly elevated LVEDP also noted. His dz was not approachable percutaneously, and he was transferred to  [facility] for CABG. Preop carotids showed 1 to 39%s b/l ICA stenosis. Preop TEE showed low normal EF 50 to 55%, NRWMA, mild concentric LVH.  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 He underwent 07/19/20 CABG x3 with LIMA to dLAD, SVG to LPDA, SVG to OM2.  Endoscopic vein harvest was from right thigh and lower leg.  He did not require postop transfusion with mild post op blood loss anemia. He had mild pain associated with his chest tube after surgery, which improved following its removal on 6/12. He was noted to be volume overloaded and IV diuresed with discontinuation of Lasix before discharge.  He was placed on amiodarone for PVC suppression without PVC induced cardiomyopathy or structural/valvular heart disease noted.  At discharge, it was noted that decision on ongoing amiodarone therapy would be deferred to Dr. Orvan Seen.  He was started on Lopressor 6/10, continued at discharge.  BP was too low to initiate ACE or ARB at discharge.  Discharge weight 121.4 kg, BP 113/64, HR 83 bpm.  He followed up with CTS/Dr. Orvan Seen 08/01/2020 and was doing well after bypass surgery.  Chest x-ray showed small left pleural effusion with otherwise no significant findings. He was instructed it would be okay to drive in 1 week.  On review of CTS notes, amiodarone 265m was continued per AVS.  Today, 08/11/2020, he presents for outpatient follow-up at [facility]  He states that he has been doing well since his bypass surgery without any chest pain or shortness of breath.  His incision sites are healing well as outlined in physical exam below.  He denies any associated inflammation/pain/swelling or signs of infection associated with his incision sites after his surgery.  He reports that his energy is doing very well, and he has been told by many physicians that he is "weeks ahead energy - wise when compared with others  that undergo bypass surgery."  He has not yet started cardiac rehab but eager to do so and waiting for the call to schedule this program.  He is walking every day and trying to increase his activity tolerance on his own.  He is able to walk 1/2 mile before he notices any difficulty breathing and denies any chest pain with activity.  He reports that he listens to his body when walking/increasing activity.  He does not have a blood pressure cuff at home but uses his daughter's blood pressure cuff, as she is a nMarine scientist  BP at home 117/64.  He reports occasional palpitations that are not concerning to him, and he will try to cut back on coffee to see if this helps improve the palpitations.  EKG today with PACs and PVCs as below.  He denies any presyncope or syncope.  No recent falls.  No amaurosis fugax.  He continues on his amiodarone 200 mg twice daily with discussion regarding this therapy and monitoring labs today and labs as below (CXR already obtained by  CTS and can be used for monitoring amiodarone as well).  He denies any symptoms of volume overload, including LEE, abdominal distention, orthopnea, early satiety, or PND.  No signs or symptoms of bleeding.  He reports medication compliance.  He is tolerating Crestor 20 mg daily well and reports previous documentation of statin intolerance included myalgias.  During today's visit, we reviewed his catheterization results and associated studies.  We also discussed his labs and recommendations for risk factor modification, including LDL control.  Home Medications   Current Outpatient Medications  Medication Instructions   amiodarone (PACERONE) 200 mg, Oral, 2 times daily   aspirin 81 mg, Oral, Daily, Swallow whole.   clopidogrel (PLAVIX) 75 mg, Oral, Daily   ferrous [id number] C-folic acid (TRINSICON / FOLTRIN) capsule 1 capsule, Oral, 2 times daily after meals   finasteride (PROSCAR) 5 mg, Oral, Daily with supper   metoprolol tartrate (LOPRESSOR)  12.5 mg, Oral, 2 times daily   omeprazole (PRILOSEC) 20 mg, Oral, Daily with supper   rosuvastatin (CRESTOR) 20 mg, Oral, Daily     Review of Systems    He denies chest pain,, dyspnea, pnd, orthopnea, n, v, dizziness, syncope, edema, weight gain, or early satiety.  He reports occasional palpitations.  All other systems reviewed and are otherwise negative except as noted above.  Physical Exam    VS:  BP 108/60 (BP Location: Left Arm, Patient Position: Sitting, Cuff Size: Normal)   Pulse 77   Ht _0  (1.956 m)   Wt 272 lb (123.4 kg)   SpO2 98%   BMI 32.25 kg/m  , BMI Body mass index is 32.25 kg/m. GEN: Well nourished, well developed, in no acute distress.  Joined by his wife. HEENT: normal. Neck: Supple, no JVD, carotid bruits, or masses. Cardiac: RRR with occasional extrasystole, no murmurs, rubs, or gallops. No clubbing, cyanosis, edema.  Radials 2+ L and 1+ R, DP/PT 2+ and equal bilaterally.  Sternal incision healing well, endoscopic incision sites healing well with far left incision site scab disturbed but without signs of infection or swelling.  RLE harvest sites healing well without edema or erythema.  All incision sites without any sign of edema or evidence of infection. Respiratory:  Respirations regular and unlabored, trivially reduced breath sounds of LLL otherwise CTAB. GI: Soft, nontender, nondistended, BS + x 4. MS: no deformity or atrophy. Skin: warm and dry, no rash. Neuro:  Strength and sensation are intact. Psych: Normal affect.  Accessory Clinical Findings    ECG personally reviewed by me today -sinus rhythm with first-degree AV block, PR interval 250 ms, PACs with aberrant conduction, PVC, right bundle branch block, T wave inversion in V1 to V6 with T wave inversions in V5 and V6 new from previous EKGs- no acute changes.  VITALS Reviewed today   Temp Readings from Last 3 Encounters:  07/24/20 98.7 F (37.1 C) (Oral)  07/18/20 97.6 F (36.4 C) (Oral)   03/19/20 98 F (36.7 C) (Oral)   BP Readings from Last 3 Encounters:  08/11/20 108/60  08/01/20 126/74  07/24/20 113/64   Pulse Readings from Last 3 Encounters:  08/11/20 77  08/01/20 81  07/24/20 83    Wt Readings from Last 3 Encounters:  08/11/20 272 lb (123.4 kg)  08/01/20 272 lb (123.4 kg)  07/24/20 267 lb 9.6 oz (121.4 kg)     LABS  reviewed today   Lab Results  Component Value Date   WBC 8.2 07/24/2020   HGB 9.2 (L) 07/24/2020  HCT 26.8 (L) 07/24/2020   MCV 91.2 07/24/2020   PLT 231 07/24/2020   Lab Results  Component Value Date   CREATININE 0.95 07/24/2020   BUN 25 (H) 07/24/2020   NA 136 07/24/2020   K 3.9 07/24/2020   CL 103 07/24/2020   CO2 24 07/24/2020   Lab Results  Component Value Date   ALT 20 07/18/2020   AST 20 07/18/2020   ALKPHOS 45 07/18/2020   BILITOT 0.7 07/18/2020   Lab Results  Component Value Date   CHOL 179 07/18/2020   HDL 43 07/18/2020   LDLCALC 117 (H) 07/18/2020   TRIG 95 07/18/2020   CHOLHDL 4.2 07/18/2020    Lab Results  Component Value Date   HGBA1C 5.9 (H) 07/19/2020   Lab Results  Component Value Date   TSH 3.54 11/14/2011     STUDIES/PROCEDURES reviewed today   CABG 07/19/20 CORONARY ARTERY BYPASS GRAFTING (CABG) TIMES THREE ON PUMP USING LEFT INTERNAL MAMMARY ARTERY AND ENDOSCOPICALLY HARVESTED RIGHT GREATER SAPHENOUS VEIN (N/A) TRANSESOPHAGEAL ECHOCARDIOGRAM (TEE) (N/A) INDOCYANINE GREEN FLUORESCENCE IMAGING (ICG) (N/A) ENDOVEIN HARVEST OF GREATER SAPHENOUS VEIN (Right)  TEE 07/19/20 POST-OP IMPRESSIONS  - Left Ventricle: The left ventricle is unchanged from pre-bypass.  - Right Ventricle: The right ventricle appears unchanged from pre-bypass.  - Aorta: The aorta appears unchanged from pre-bypass.  - Left Atrium: The left atrium appears unchanged from pre-bypass.  - Left Atrial Appendage: The left atrial appendage appears unchanged from  pre-bypass.  - Aortic Valve: The aortic valve appears unchanged  from pre-bypass.  - Mitral Valve: The mitral valve appears unchanged from pre-bypass.  - Tricuspid Valve: The tricuspid valve appears unchanged from pre-bypass.  - Pulmonic Valve: The pulmonic valve appears unchanged from pre-bypass.  - Interatrial Septum: The interatrial septum appears unchanged from  pre-bypass.  - Interventricular Septum: The interventricular septum appears unchanged  from  pre-bypass.  - Pericardium: The pericardium appears unchanged from pre-bypass.  - Comments: Good LV function post bypass, No RWMA.  PRE-OP FINDINGS   Left Ventricle: The left ventricle has low normal systolic function, with  an ejection fraction of 50-55%. The cavity size was normal. There is no  increase in left ventricular wall thickness. There is mild concentric left  ventricular hypertrophy.   Carotids  07/18/20 Summary:  Right Carotid: Velocities in the right ICA are consistent with a 1-39%  stenosis.  Left Carotid: Velocities in the left ICA are consistent with a 1-39%  stenosis.  Vertebrals:  Bilateral vertebral arteries demonstrate antegrade flow.  Subclavians: Normal flow hemodynamics were seen in bilateral subclavian               arteries.  Right Upper Extremity: Doppler waveform obliterate with right radial  compression. Doppler waveforms remain within normal limits with right  ulnar compression.  Left Upper Extremity: Doppler waveform obliterate with left radial  compression. Doppler waveforms remain within normal limits with left ulnar  compression.   CT 07/18/2020 1. 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 Recommend annual imaging followup by CTA or MRA. This recommendation follows 2010 ACCF/AHA/AATS/ACR/ASA/SCA/SCAI/SIR/STS/SVM Guidelines for the Diagnosis and Management of Patients with Thoracic Aortic Disease. Circulation.2010; 121: Z025-E527. Aortic aneurysm NOS (ICD10-I71.9) 2.  Calcific coronary artery and aortic  atherosclerosis. Aortic Atherosclerosis (ICD10-I70.0).  LHC 07/18/20 Ost LM to Dist LM lesion is 40% stenosed. Dist LM to Ost LAD lesion is 95% stenosed. Ost Cx to Prox Cx lesion is 20% stenosed. Prox LAD lesion  is 80% stenosed. Prox LAD to Mid LAD lesion is 95% stenosed. LPDA lesion is 70% stenosed. 3rd Mrg lesion is 50% stenosed. The left ventricular systolic function is normal. LV end diastolic pressure is mildly elevated. The left ventricular ejection fraction is 55-65% by visual estimate. 1.  Left dominant coronary arteries with moderate left main disease, critical ostial LAD disease with 2 other lesions in the proximal segment and significant ostial left PDA disease.  The ostial/proximal LAD is moderately to severely calcified.  The takeoff from the left main does not allow stenting of the LAD without jailing the large dominant left circumflex. 2.  Normal LV systolic function and mildly elevated left ventricular end-diastolic pressure. Recommendations: The patient's LAD disease is not approachable percutaneously.  I think the best option is CABG. Transfer the patient to [facility]. Resume heparin at 9 PM. Obtain an echocardiogram.   ---------------------------------- Coronary Findings   Diagnostic Dominance: Left   Left Main  Ost LM to Dist LM lesion is 40% stenosed. The lesion is mildly calcified.  Dist LM to Ost LAD lesion is 95% stenosed. The lesion is moderately calcified.  Left Anterior Descending  Prox LAD lesion is 80% stenosed.  Prox LAD to Mid LAD lesion is 95% stenosed.  Left Circumflex  Ost Cx to Prox Cx lesion is 20% stenosed.  Third Obtuse Marginal Branch  3rd Mrg lesion is 50% stenosed.  Left Posterior Descending Artery  LPDA lesion is 70% stenosed.  Right Coronary Artery  The vessel exhibits minimal luminal irregularities.    Intervention     No interventions have been documented.   Wall Motion        Resting                      Left Heart   Left Ventricle The left ventricular size is normal. The left ventricular systolic function is normal. LV end diastolic pressure is mildly elevated. The left ventricular ejection fraction is 55-65% by visual estimate. No regional wall motion abnormalities.    Coronary Diagrams     Diagnostic Dominance: Left             Assessment & Plan    H/o Non-STEMI Coronary artery disease s/p CABG x3 (07/19/2020) -- No CP or SOB.  EKG today as above. Presented to [facility] 07/18/2020 with exertional chest pain. HS Tn 17  20.  LHC showed critical ostial and proximal LAD dz. He underwent CABGx3 on 07/19/2020 with LIMA to dLAD, SVG to LPDA, SVG to OM2 and harvesting vein grafts from the right thigh and lower leg. Ordered echo to assess EF, WM, structure/ valvular function, as this was not obtained prior to bypass surgery.  TEE showed EF 50 to 55%, NR WMA, concentric LVH as below.  Continue DAPT with ASA 81 mg daily and Plavix 75 mg daily.  Check CBC.  Will discontinue Prilosec and start Protonix 40 mg daily to reduce risk of interaction between Prilosec and Protonix.  As his rate and BP is well controlled today on Lopressor 12.5 mg twice daily, we will transition to to Toprol 25 mg daily today. Continue current Crestor, which he is currently tolerating despite history of statin intolerance, and with plan as below if LDL not at goal when we recheck his lipids in 6 to 8 weeks.  Check CBC, c-Met.  Recheck lipid and liver function in 6 to 8 weeks. Discussed lifestyle changes, aggressive risk factor modification.  LDL and glycemic control recommended  as outlined directly below.  Cessation of smoking cigars.  Heart healthy, low carbohydrate, reduced salt diet.  Continue to increase activity as tolerated.  He plans to complete cardiac rehab once they reach out to him.  Diastolic dysfunction (EF 50 to 55%, concentric LVH) --No shortness of breath reported.  Euvolemic and well compensated on exam.  TEE prior  to CABG showed EF 50 to 55%, concentric LVH. LHC showed elevated LVEDP with pt noted to be volume up after bypass surgery.  He was IV diuresed during admission with improvement in volume status and did not require po lasix at discharge.  Given he is euvolemic today with soft BP, we will continue to defer addition of standing diuretic.  Reassess volume at RTC.  Discussed recommendations for ongoing low-salt and fluid diet.  Guidelines provided in AVS. Daily weights and BP checks discussed.  Echo ordered today with TEE above and TTE not obtained before proceeding to CABG/during his admission.  Recommend addition of ACE/ARB once room in BP - will defer for now and reassess at RTC.  Continue BB with Lopressor changed to Toprol 25 mg daily today.  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 Today, EKG shows QTC 493 ms with consideration that this may be prolonged from PVCs on EKG. EKG does show ongoing ectopy with PACs and PVCs. Will check electrolytes, TSH, FT4 today with a BMET and TSH/FT4. Reassess dosing and QTc at RTC.  Amiodarone monitoring labs discussed - will order LFTs / TSH and FT4 as above.  Annual eye exams recommended.  Most recent CXR above. Continue with Toprol with close monitoring of sx given first degree AVB and RBBB (known) as above. No reported presyncope or syncope today.  Recent L pleural effusion by CXR -- Seen on recent CXR by CTS s/p CABG. Denies associated SOB at rest and increasing activity with SOB improving though still present after 1/2 mile as above. Consider repeat CXR to monitor if indicated at RTC. Will defer for now given he reports improvement in breathing today and only trivial reduction in LLL breath sounds on exam.  HLD, LDL goal below 70 -- Most recent labs show LDL 117, total cholesterol 179.   Statin intolerance listed, but he is tolerating Crestor 20 mg daily.   Recheck LDL / LFTs in 6 to 8 weeks.  If LDL not at goal at that time, addition of Zetia +/- PCSK9 inhibitor should be considered.  He may not tolerate Crestor 39m daily, given his intolerance, but this could also be attempted.  Most recent LFTs WNL.     HTN, goal BP 130/80 or lower -- Most recent BP 108/60.   Discussed salt and fluid restrictions, as well as monitoring weight and BP.  Recommend ACE/ARB tx once room in BP given comorbid elevated A1C and low normal EF as above. Will defer given soft BP today.  Bilateral carotid artery disease, aortic atherosclerosis --No amaurosis fugax or concerning symptoms.  Mild R/LICA stenosis at 1 to 39% bilaterally. LDL goal below 70, HR/BP, glycemic control recommended. Continue statin and ASA.  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  Avoid FQ --added FQ to his intolerances.  Avoid heavy lifting.  HR/BP/LDL/A1C control.  Continue statin and ASA. Annual CTA/MRA to monitor.  Post op blood loss  anemia H/o iron deficiency anemia on Fe supplement -- Most recent H&H stable with Hgb 9.2 and Hct 26.8, consistent with postop blood loss anemia as noted in HPI above.  No reported s/sx of bleeding.  He also has known Fe deficiency anemia on Fe supplementation. Check CBC.    Elevated A1c /hyperglycemia --Elevated A1C 5.9. Glycemic control recommended per PCP.  Addition of ACE/ARB recommended once rumen BP given comorbid hypertension and elevated A1c.    History of cigar use -Occasional cigar use reported in the past.  Complete cessation recommended for aggressive risk factor modification.  GERD -- Reflux symptoms controlled.  As some studies suggest Prilosec/Omeprazole interacts with Plavix, dc'd Prilosec and started Protonix 18m daily for less chance of interaction.  Medication changes: - Stop Prilosec. - Start  Protonix 40 mg daily. - Stop Lopressor. - Start Toprol 25 mg daily. Labs ordered: - CBC - C-Met - TSH, FT4 -- dLDL, lipids Studies / Imaging ordered: - Echocardiogram Future considerations: - Addition of ARB once room in BP - Addition of standing diuretic if volume up at RTC - Ongoing amiodarone therapy and monitoring - Zetia/PCSK9 inhibitor if LDL not at goal (does have reported statin tolerance) - Annual CTA/MRA for monitoring of Ao dilation Disposition:  --RTC 1 month  *Please be aware that the above documentation was completed voice recognition software and may contain dictation errors.    JArvil Chaco PA-C 08/11/2020

## 2020-08-11 NOTE — Patient Instructions (Signed)
Medication Instructions:  STOP Metoprolol tartrate Omeprazole  START  Metoprolol Succinate (Toprol) 25 mg Daily Protonix 40 mg Daily  Refills was sent in for Plavix   *If you need a refill on your cardiac medications before your next appointment, please call your pharmacy*   Lab Work: Direct LDL, Lipids, TSH, CMET, Free T-4 (Have drawn in approx 6 weeks)  Walk into medical mall at the check in desk, they will direct you to lab registration, hours for labs are Monday-Friday 07:00am-5:30pm (no appointment necessary)  [place], ABNORMAL RESULTS WILL BE CALLED  Testing/Procedures: ECHO  Your physician has requested that you have an echocardiogram. Echocardiography is a painless test that uses sound waves to create images of your heart. It provides your doctor with information about the size and shape of your heart and how well your heart's chambers and valves are working. This procedure takes approximately one hour. There are no restrictions for this procedure.  There is a possibility that an IV may need to be started during your test to inject an image enhancing agent. This is done to obtain more optimal pictures of your heart. Therefore we ask that you do at least drink some water prior to coming in to hydrate your veins.     Follow-Up: At [facility], you and your health needs are our priority.  As part of our continuing mission to provide you with exceptional heart care, we have created designated Provider Care Teams.  These Care Teams include your primary Cardiologist (physician) and Advanced Practice Providers (APPs -  Physician Assistants and Nurse Practitioners) who all work together to provide you with the care you need, when you need it.   Your next appointment:   1 month(s)  The format for your next appointment:   In Person  Provider:   [name], MD or Marrianne Mood, PA-C   Other Instructions  Salt and Fluid: We recommend a maximum  of 2g sodium per day and 2L total fluid per day. Fluids include coffee, tea, water, and juice.  In addition, we recommend you monitor both your daily weight and daily BP at the same time each day - bring this long into the office.    Medications to avoid: Patients taking blood thinners should generally stay away from medicines like ibuprofen, Advil, Motrin, naproxen, and Aleve due to risk of stomach bleeding. You may take Tylenol as directed or talk to your primary doctor about alternatives.   Blood pressure: --We recommend upper arm BP cuffs over that of the wrist. --You can always check your device's accuracy against an office model once a year if possible. You can do so by bringing your cuff into the office and notifying the person that rooms you that you would like to check your cuff against our office readings. --Measure your BP at the same time each day. Blood pressure varies often throughout the day with higher readings in the morning. BP may also be lower at home than in the office. Do not measure BP right after you wake up or after exercising. Avoid caffeine, tobacco, and alcohol for 30 minutes before taking a measurement.  --Sit quietly for five minutes in a comfortable position with your legs and ankles uncrossed and back supported. Feet flat on the ground. Have your arm supported and at the level of your heart. Always use the same arm when taking your blood pressure. Place the cuff over bare skin rather than clothing. Each time you measure, take  an additional reading if abnormal to ensure accurate by waiting 1-3 minutes after the first reading. --Please bring a BP log into the office. It is helpful to document the time of each BP reading, as well as any activity or medications taken around the reading. In addition, it is helpful to include heart rate at the time of the BP reading. Daily weights are also encouraged. --Goal BP is 130/80 or lower. If your BP is low but you are not dizzy, this is  fine and preferred over BP higher than 130/80. If your BP is less than 100 for the top number and you are dizzy, call the office. If your blood pressure is consistently elevated with top number above 180 and bottom above 120, this can damage the body. If you have severe increase in your blood pressure or concerning symptoms of severe chest pain, headache with confusion and blurred vision, severe abdominal or back pain, shortness of breath, seizures, or loss of consciousness, go to the emergency department.

## 2020-08-14 NOTE — Progress Notes (Signed)
08/15/2020  10:25 AM   [name] 16-Jan-1946 [id number]  Referring provider: Adin Hector, MD [facility] [place],  [place] [address]  Chief Complaint  Patient presents with   Other    Urological history 1. BPH with LU TS - I PSS 11/4 - PSA 0.5 02/2019 - cysto 03/2019 moderate lateral lobe enlargement prostate.   mild elevation bladder neck - managed with finasteride 5 mg daily   2. Urge incontinence - PVR 0 mL  HPI: BERYLE ZEITZ is a 75 y.o. male who presents today for follow up after after completing 12 weekly treatments of PTNS.  Of note, he had CABG one month ago.    He feels like the PTNS has been helpful.    His only urinary complaint at this time is nocturia, but he attributes this mostly to having recent open heart surgery.  Patient denies any modifying or aggravating factors.  Patient denies any gross hematuria, dysuria or suprapubic/flank pain.  Patient denies any fevers, chills, nausea or vomiting.      IPSS     Row Name 08/15/20 1000         International Prostate Symptom Score   How often have you had the sensation of not emptying your bladder? Less than half the time     How often have you had to urinate less than every two hours? Less than half the time     How often have you found you stopped and started again several times when you urinated? Not at All     How often have you found it difficult to postpone urination? About half the time     How often have you had a weak urinary stream? About half the time     How often have you had to strain to start urination? Not at All     How many times did you typically get up at night to urinate? 3 Times     Total IPSS Score 13           Quality of Life due to urinary symptoms     If you were to spend the rest of your life with your urinary condition just the way it is now how would you feel about that? Mixed              Score:  1-7 Mild 8-19  Moderate 20-35 Severe   PMH: Past Medical History:  Diagnosis Date   Benign neoplasm of large bowel    BPH with obstruction/lower urinary tract symptoms    Dysphagia    Erectile dysfunction    Heartburn    History of kidney stones    History of stomach ulcers    HLD (hyperlipidemia)    Hypogonadism in male    Obesity    Urinary frequency    Urinary urgency    Vitamin D deficiency     Surgical History: Past Surgical History:  Procedure Laterality Date   APPENDECTOMY     CORONARY ARTERY BYPASS GRAFT N/A 07/19/2020   Procedure: CORONARY ARTERY BYPASS GRAFTING (CABG) TIMES THREE ON PUMP USING LEFT INTERNAL MAMMARY ARTERY AND ENDOSCOPICALLY HARVESTED RIGHT GREATER SAPHENOUS VEIN;  Surgeon: Wonda Olds, MD;  Location: [place];  Service: Open Heart Surgery;  Laterality: N/A;   ENDOVEIN HARVEST OF GREATER SAPHENOUS VEIN Right 07/19/2020   Procedure: ENDOVEIN HARVEST OF GREATER SAPHENOUS VEIN;  Surgeon: Wonda Olds, MD;  Location: [place];  Service: Open Heart Surgery;  Laterality: Right;   LEFT HEART CATH AND CORONARY ANGIOGRAPHY N/A 07/18/2020   Procedure: LEFT HEART CATH AND CORONARY ANGIOGRAPHY;  Surgeon: Wellington Hampshire, MD;  Location: [place] [facility];  Service: Cardiovascular;  Laterality: N/A;   TEE WITHOUT CARDIOVERSION N/A 07/19/2020   Procedure: TRANSESOPHAGEAL ECHOCARDIOGRAM (TEE);  Surgeon: Wonda Olds, MD;  Location: [place];  Service: Open Heart Surgery;  Laterality: N/A;    Home Medications:  Allergies as of 08/15/2020       Reactions   Lovastatin    Reports statin intolerance/myalgias.  Tolerates Crestor 20 mg daily.   Quinolones    Contraindicated given aortic dilation   Androgel [testosterone] Rash        Medication List        Accurate as of August 15, 2020 10:25 AM. If you have any questions, ask your nurse or doctor.          amiodarone 200 MG tablet Commonly known as: PACERONE Take 1 tablet (200 mg total) by mouth 2 (two) times daily.    aspirin 81 MG EC tablet Take 1 tablet (81 mg total) by mouth daily. Swallow whole.   clopidogrel 75 MG tablet Commonly known as: PLAVIX Take 1 tablet (75 mg total) by mouth daily.   ferrous [id number] C-folic acid capsule Commonly known as: TRINSICON / FOLTRIN Take 1 capsule by mouth 2 (two) times daily after a meal.   finasteride 5 MG tablet Commonly known as: PROSCAR Take 1 tablet (5 mg total) by mouth daily with supper.   metoprolol succinate 25 MG 24 hr tablet Commonly known as: Toprol XL Take 1 tablet (25 mg total) by mouth daily.   pantoprazole 40 MG tablet Commonly known as: PROTONIX Take 1 tablet (40 mg total) by mouth daily.   rosuvastatin 20 MG tablet Commonly known as: CRESTOR Take 1 tablet (20 mg total) by mouth daily.        Allergies:  Allergies  Allergen Reactions   Lovastatin     Reports statin intolerance/myalgias.  Tolerates Crestor 20 mg daily.   Quinolones     Contraindicated given aortic dilation   Androgel [Testosterone] Rash    Family History: Family History  Problem Relation Age of Onset   Alzheimer's disease Father    Prostate cancer Brother    Diabetes Mellitus II Mother    Stroke Mother     Social History:  reports that he has been smoking cigars. He has never used smokeless tobacco. He reports current alcohol use. He reports that he does not use drugs.  For pertinent review of systems please refer to history of present illness  Physical Exam: BP 118/74   Pulse 72   Ht _0  (1.956 m)   Wt 273 lb (123.8 kg)   BMI 32.37 kg/m   Constitutional:  Well nourished. Alert and oriented, No acute distress. HEENT: [place] AT, mask in place.  Trachea midline Cardiovascular: No clubbing, cyanosis, or edema. Respiratory: Normal respiratory effort, no increased work of breathing. Neurologic: Grossly intact, no focal deficits, moving all 4 extremities. Psychiatric: Normal mood and affect.   Laboratory Data: WBC (White Blood Cell  Count) 4.1 - 10.2 10^3/uL 6.8   RBC (Red Blood Cell Count) 4.69 - 6.13 10^6/uL 3.82 Low    Hemoglobin 14.1 - 18.1 gm/dL 11.4 Low    Hematocrit 40.0 - 52.0 % 34.7 Low    MCV (Mean Corpuscular Volume) 80.0 - 100.0 fl 90.8   MCH (Mean  Corpuscular Hemoglobin) 27.0 - 31.2 pg 29.8   MCHC (Mean Corpuscular Hemoglobin Concentration) 32.0 - 36.0 gm/dL 32.9   Platelet Count 150 - 450 10^3/uL 382   RDW-CV (Red Cell Distribution Width) 11.6 - 14.8 % 13.4   MPV (Mean Platelet Volume) 9.4 - 12.4 fl 8.6 Low    Neutrophils 1.50 - 7.80 10^3/uL 4.02   Lymphocytes 1.00 - 3.60 10^3/uL 1.60   Monocytes 0.00 - 1.50 10^3/uL 0.42   Eosinophils 0.00 - 0.55 10^3/uL 0.65 High    Basophils 0.00 - 0.09 10^3/uL 0.04   Neutrophil % 32.0 - 70.0 % 59.6   Lymphocyte % 10.0 - 50.0 % 23.7   Monocyte % 4.0 - 13.0 % 6.2   Eosinophil % 1.0 - 5.0 % 9.6 High    Basophil% 0.0 - 2.0 % 0.6   Immature Granulocyte % <=0.7 % 0.3   Immature Granulocyte Count <=0.06 10^3/L 0.02   Resulting Agency  [facility] - [facility]  Specimen Collected: 08/08/20 17:24 Last Resulted: 08/08/20 17:39  Received From: [place]  Result Received: 08/11/20 08:20   Glucose 70 - 110 mg/dL 109   Sodium 136 - 145 mmol/L 140   Potassium 3.6 - 5.1 mmol/L 4.4   Chloride 97 - 109 mmol/L 104   Carbon Dioxide (CO2) 22.0 - 32.0 mmol/L 26.3   Urea Nitrogen (BUN) 7 - 25 mg/dL 20   Creatinine 0.7 - 1.3 mg/dL 1.0   Glomerular Filtration Rate (eGFR), MDRD Estimate >60 mL/min/1.73sq m 73   Calcium 8.7 - 10.3 mg/dL 9.2   AST  8 - 39 U/L 12   ALT  6 - 57 U/L 12   Alk Phos (alkaline Phosphatase) 34 - 104 U/L 64   Albumin 3.5 - 4.8 g/dL 4.1   Bilirubin, Total 0.3 - 1.2 mg/dL 0.5   Protein, Total 6.1 - 7.9 g/dL 6.5   A/G Ratio 1.0 - 5.0 gm/dL 1.7   Resulting Agency  [facility] - [facility]  Specimen Collected: 08/08/20 17:24 Last Resulted: 08/08/20 18:28  Received From: [place]  Result Received: 08/11/20 08:20   I have reviewed the labs.  Pertinent Imaging Results for SERAFIN, DECATUR "MIKE" (MRN [id number]) as of 08/15/2020 10:31  Ref. Range 08/15/2020 10:30  Scan Result Unknown 0    Assessment & Plan:    1. BPH with LUTS -continue finasteride 5 mg daily   2. Urge incontinence -he has noticed a reduction in the urge incontinence with the PTNS -he would like to continue the monthly PTNS at this time   Return in about 1 month (around 09/15/2020) for Maintenance PTNS.  Zara Council, PA-C [facility] [address] [place], [place] [address] [phone]

## 2020-08-15 ENCOUNTER — Other Ambulatory Visit: Payer: Self-pay

## 2020-08-15 ENCOUNTER — Ambulatory Visit (INDEPENDENT_AMBULATORY_CARE_PROVIDER_SITE_OTHER): Payer: PPO | Admitting: Urology

## 2020-08-15 ENCOUNTER — Encounter: Payer: Self-pay | Admitting: Urology

## 2020-08-15 VITALS — BP 118/74 | HR 72 | Ht 77.0 in | Wt 273.0 lb

## 2020-08-15 DIAGNOSIS — N138 Other obstructive and reflux uropathy: Secondary | ICD-10-CM | POA: Diagnosis not present

## 2020-08-15 DIAGNOSIS — N3941 Urge incontinence: Secondary | ICD-10-CM

## 2020-08-15 DIAGNOSIS — N401 Enlarged prostate with lower urinary tract symptoms: Secondary | ICD-10-CM | POA: Diagnosis not present

## 2020-08-15 LAB — BLADDER SCAN AMB NON-IMAGING: Scan Result: 0

## 2020-08-17 ENCOUNTER — Ambulatory Visit: Payer: PPO | Admitting: Cardiothoracic Surgery

## 2020-08-29 DIAGNOSIS — E039 Hypothyroidism, unspecified: Secondary | ICD-10-CM | POA: Diagnosis not present

## 2020-08-29 DIAGNOSIS — R739 Hyperglycemia, unspecified: Secondary | ICD-10-CM | POA: Diagnosis not present

## 2020-08-29 DIAGNOSIS — E7849 Other hyperlipidemia: Secondary | ICD-10-CM | POA: Diagnosis not present

## 2020-09-05 DIAGNOSIS — I77810 Thoracic aortic ectasia: Secondary | ICD-10-CM | POA: Diagnosis not present

## 2020-09-05 DIAGNOSIS — E7849 Other hyperlipidemia: Secondary | ICD-10-CM | POA: Diagnosis not present

## 2020-09-05 DIAGNOSIS — R739 Hyperglycemia, unspecified: Secondary | ICD-10-CM | POA: Diagnosis not present

## 2020-09-05 DIAGNOSIS — K760 Fatty (change of) liver, not elsewhere classified: Secondary | ICD-10-CM | POA: Diagnosis not present

## 2020-09-05 DIAGNOSIS — K219 Gastro-esophageal reflux disease without esophagitis: Secondary | ICD-10-CM | POA: Diagnosis not present

## 2020-09-05 DIAGNOSIS — R7989 Other specified abnormal findings of blood chemistry: Secondary | ICD-10-CM | POA: Diagnosis not present

## 2020-09-05 DIAGNOSIS — I2510 Atherosclerotic heart disease of native coronary artery without angina pectoris: Secondary | ICD-10-CM | POA: Diagnosis not present

## 2020-09-05 DIAGNOSIS — R1013 Epigastric pain: Secondary | ICD-10-CM | POA: Diagnosis not present

## 2020-09-05 DIAGNOSIS — E349 Endocrine disorder, unspecified: Secondary | ICD-10-CM | POA: Diagnosis not present

## 2020-09-14 ENCOUNTER — Other Ambulatory Visit: Payer: Self-pay

## 2020-09-14 ENCOUNTER — Ambulatory Visit (INDEPENDENT_AMBULATORY_CARE_PROVIDER_SITE_OTHER): Payer: PPO | Admitting: Cardiovascular Disease

## 2020-09-14 ENCOUNTER — Encounter: Payer: Self-pay | Admitting: Cardiovascular Disease

## 2020-09-14 VITALS — BP 120/60 | HR 63 | Ht 77.0 in | Wt 278.0 lb

## 2020-09-14 DIAGNOSIS — I10 Essential (primary) hypertension: Secondary | ICD-10-CM | POA: Diagnosis not present

## 2020-09-14 DIAGNOSIS — I493 Ventricular premature depolarization: Secondary | ICD-10-CM

## 2020-09-14 DIAGNOSIS — I2510 Atherosclerotic heart disease of native coronary artery without angina pectoris: Secondary | ICD-10-CM | POA: Diagnosis not present

## 2020-09-14 DIAGNOSIS — E785 Hyperlipidemia, unspecified: Secondary | ICD-10-CM | POA: Diagnosis not present

## 2020-09-14 MED ORDER — PANTOPRAZOLE SODIUM 40 MG PO TBEC: 40.0000 mg | DELAYED_RELEASE_TABLET | Freq: Every day | ORAL | 1 refills | Status: DC

## 2020-09-14 NOTE — Patient Instructions (Addendum)
Medication Instructions:  - Your physician has recommended you make the following change in your medication:   1) STOP amiodarone  2) A 90-day supply of Protonix has been refilled to your pharmacy  *If you need a refill on your cardiac medications before your next appointment, please call your pharmacy*   Lab Work: - none ordered  If you have labs (blood work) drawn today and your tests are completely normal, you will receive your results only by: [place] (if you have MyChart) OR A paper copy in the mail If you have any lab test that is abnormal or we need to change your treatment, we will call you to review the results.   Testing/Procedures: - none ordered   Follow-Up: At [facility] you and your health needs are our priority.  As part of our continuing mission to provide you with exceptional heart care, we have created designated Provider Care Teams.  These Care Teams include your primary Cardiologist (physician) and Advanced Practice Providers (APPs -  Physician Assistants and Nurse Practitioners) who all work together to provide you with the care you need, when you need it.  We recommend signing up for the patient portal called "MyChart".  Sign up information is provided on this After Visit Summary.  MyChart is used to connect with patients for Virtual Visits (Telemedicine).  Patients are able to view lab/test results, encounter notes, upcoming appointments, etc.  Non-urgent messages can be sent to your provider as well.   To learn more about what you can do with MyChart, go to [email or web address].    Your next appointment:   3 month(s)  The format for your next appointment:   In Person  Provider:   You may see [name], MD or one of the following Advanced Practice Providers on your designated Care Team:   Murray Hodgkins, NP Christell Faith, PA-C Marrianne Mood, PA-C Cadence Kathlen Mody, [place]   Other Instructions N/a

## 2020-09-14 NOTE — Progress Notes (Signed)
Cardiology Office Note   Date:  09/14/2020   ID:  Brian Banks, DOB 08-08-1945, MRN [id number]  PCP:  Adin Hector, MD  Cardiologist:   [name], MD   Chief Complaint  Patient presents with   Other    1 month f/u no complaints today. Meds reviewed verbally with pt.      History of Present Illness: Brian Banks is a 75 y.o. male who presents for a follow-up visit regarding coronary artery disease status post CABG in June. He has known history of hyperlipidemia, GERD, borderline diabetes, obesity and cigar use. He presented in June with unstable angina.  Cardiac catheterization showed left dominant coronary arteries with moderate left main disease, critical ostial LAD stenosis with tandem lesions in the proximal segment and significant ostial left PDA stenosis.  The LAD was moderately to severely calcified with extension of the plaque into the distal left main making stenting of the LAD not possible without jailing the dominant left circumflex.  Ejection fraction was normal with mildly elevated left ventricular end-diastolic pressure.  Based on this, I recommended CABG.  The patient underwent CABG at [facility] with LIMA to LAD, SVG to left PDA and SVG to OM 2.  The patient had blood loss anemia that required transfusion and also had PVCs that were treated with amiodarone. He has been doing very well with no chest pain or shortness of breath.  He does report fatigue especially in the heat.  He has not started cardiac rehab yet but is planning to do so next week.  No significant leg edema.  He ran out of amiodarone 1 week ago.    Past Medical History:  Diagnosis Date   Benign neoplasm of large bowel    BPH with obstruction/lower urinary tract symptoms    Dysphagia    Erectile dysfunction    Heartburn    History of kidney stones    History of stomach ulcers    HLD (hyperlipidemia)    Hypogonadism in male    Obesity    Urinary frequency    Urinary urgency    Vitamin D  deficiency     Past Surgical History:  Procedure Laterality Date   APPENDECTOMY     CORONARY ARTERY BYPASS GRAFT N/A 07/19/2020   Procedure: CORONARY ARTERY BYPASS GRAFTING (CABG) TIMES THREE ON PUMP USING LEFT INTERNAL MAMMARY ARTERY AND ENDOSCOPICALLY HARVESTED RIGHT GREATER SAPHENOUS VEIN;  Surgeon: Wonda Olds, MD;  Location: [place];  Service: Open Heart Surgery;  Laterality: N/A;   ENDOVEIN HARVEST OF GREATER SAPHENOUS VEIN Right 07/19/2020   Procedure: ENDOVEIN HARVEST OF GREATER SAPHENOUS VEIN;  Surgeon: Wonda Olds, MD;  Location: [place];  Service: Open Heart Surgery;  Laterality: Right;   LEFT HEART CATH AND CORONARY ANGIOGRAPHY N/A 07/18/2020   Procedure: LEFT HEART CATH AND CORONARY ANGIOGRAPHY;  Surgeon: Wellington Hampshire, MD;  Location: [place] [facility];  Service: Cardiovascular;  Laterality: N/A;   TEE WITHOUT CARDIOVERSION N/A 07/19/2020   Procedure: TRANSESOPHAGEAL ECHOCARDIOGRAM (TEE);  Surgeon: Wonda Olds, MD;  Location: [place];  Service: Open Heart Surgery;  Laterality: N/A;     Current Outpatient Medications  Medication Sig Dispense Refill   aspirin EC 81 MG EC tablet Take 1 tablet (81 mg total) by mouth daily. Swallow whole.     clopidogrel (PLAVIX) 75 MG tablet Take 1 tablet (75 mg total) by mouth daily. 90 tablet 3   ferrous [id number] C-folic acid (TRINSICON /  FOLTRIN) capsule Take 1 capsule by mouth 2 (two) times daily after a meal. 60 capsule 2   finasteride (PROSCAR) 5 MG tablet Take 1 tablet (5 mg total) by mouth daily with supper.     metoprolol succinate (TOPROL XL) 25 MG 24 hr tablet Take 1 tablet (25 mg total) by mouth daily. 90 tablet 3   pantoprazole (PROTONIX) 40 MG tablet Take 1 tablet (40 mg total) by mouth daily. 30 tablet 11   rosuvastatin (CRESTOR) 20 MG tablet Take 1 tablet (20 mg total) by mouth daily. 30 tablet 1   No current facility-administered medications for this visit.    Allergies:   Lovastatin, Quinolones, and  Androgel [testosterone]    Social History:  The patient  reports that he has been smoking cigars. He has never used smokeless tobacco. He reports current alcohol use. He reports that he does not use drugs.   Family History:  The patient's family history includes Alzheimer's disease in his father; Diabetes Mellitus II in his mother; Prostate cancer in his brother; Stroke in his mother.    ROS:  Please see the history of present illness.   Otherwise, review of systems are positive for none.   All other systems are reviewed and negative.    PHYSICAL EXAM: VS:  BP 120/60 (BP Location: Left Arm, Patient Position: Sitting, Cuff Size: Normal)   Pulse 63   Ht '6\' 5"'$  (1.956 m)   Wt 278 lb (126.1 kg)   SpO2 98%   BMI 32.97 kg/m  , BMI Body mass index is 32.97 kg/m. GEN: Well nourished, well developed, in no acute distress  HEENT: normal  Neck: no JVD, carotid bruits, or masses Cardiac: RRR; no murmurs, rubs, or gallops, trace edema  Respiratory:  clear to auscultation bilaterally, normal work of breathing GI: soft, nontender, nondistended, + BS MS: no deformity or atrophy  Skin: warm and dry, no rash Neuro:  Strength and sensation are intact Psych: euthymic mood, full affect   EKG:  EKG is ordered today. The ekg ordered today demonstrates sinus rhythm with first-degree AV block   Recent Labs: 07/18/2020: ALT 20 07/20/2020: Magnesium 2.1 07/24/2020: BUN 25; Creatinine, Ser 0.95; Hemoglobin 9.2; Platelets 231; Potassium 3.9; Sodium 136    Lipid Panel    Component Value Date/Time   CHOL 179 07/18/2020 1019   TRIG 95 07/18/2020 1019   HDL 43 07/18/2020 1019   CHOLHDL 4.2 07/18/2020 1019   VLDL 19 07/18/2020 1019   LDLCALC 117 (H) 07/18/2020 1019      Wt Readings from Last 3 Encounters:  09/14/20 278 lb (126.1 kg)  08/15/20 273 lb (123.8 kg)  08/11/20 272 lb (123.4 kg)       No flowsheet data found.    ASSESSMENT AND PLAN:  1.  Coronary artery disease involving native  coronary artery status post CABG: He is overall doing very well with no anginal symptoms.  I strongly advised him to attend cardiac rehab and he is planning to do so next week.  Continue dual antiplatelet therapy until June 2023. Clopidogrel Can be discontinued at that time.  2.  Chronic diastolic heart failure: He appears to be euvolemic without any diuretics.  3.  Frequent PVCs: No PVCs noted today.  I discontinued amiodarone.  He ran out of the medication 1 week ago anyway.  4.  Hyperlipidemia: I reviewed his recent lipid profile which showed an LDL of 50.  Based on this, continue treatment with rosuvastatin.  5.  Essential  hypertension: Blood pressures controlled.  6.  History of cigar use: He usually smokes 1 cigar a day and I discussed the importance of cessation.   Disposition:   FU with me in 3 months  Signed,  [name], MD  09/14/2020 9:00 AM    [place]

## 2020-09-18 ENCOUNTER — Ambulatory Visit: Payer: Self-pay

## 2020-09-25 ENCOUNTER — Other Ambulatory Visit
Admission: RE | Admit: 2020-09-25 | Discharge: 2020-09-25 | Disposition: A | Discharge status: Home / Self Care | Payer: PPO | Attending: Physician Assistant | Admitting: Physician Assistant

## 2020-09-25 DIAGNOSIS — Z79899 Other long term (current) drug therapy: Secondary | ICD-10-CM | POA: Insufficient documentation

## 2020-09-25 LAB — CBC
HCT: 38 % — ABNORMAL LOW (ref 39.0–52.0)
Hemoglobin: 12.8 g/dL — ABNORMAL LOW (ref 13.0–17.0)
MCH: 29.2 pg (ref 26.0–34.0)
MCHC: 33.7 g/dL (ref 30.0–36.0)
MCV: 86.8 fL (ref 80.0–100.0)
Platelets: 235 10*3/uL (ref 150–400)
RBC: 4.38 MIL/uL (ref 4.22–5.81)
RDW: 13.2 % (ref 11.5–15.5)
WBC: 6.6 10*3/uL (ref 4.0–10.5)
nRBC: 0 % (ref 0.0–0.2)

## 2020-11-15 ENCOUNTER — Other Ambulatory Visit: Payer: Self-pay

## 2020-11-15 ENCOUNTER — Ambulatory Visit (INDEPENDENT_AMBULATORY_CARE_PROVIDER_SITE_OTHER): Payer: PPO

## 2020-11-15 DIAGNOSIS — I25810 Atherosclerosis of coronary artery bypass graft(s) without angina pectoris: Secondary | ICD-10-CM

## 2020-11-15 DIAGNOSIS — Z951 Presence of aortocoronary bypass graft: Secondary | ICD-10-CM

## 2020-11-15 DIAGNOSIS — Z9289 Personal history of other medical treatment: Secondary | ICD-10-CM

## 2020-11-15 HISTORY — DX: Personal history of other medical treatment: Z92.89

## 2020-11-15 LAB — ECHOCARDIOGRAM COMPLETE
AR max vel: 3.68 cm2
AV Area VTI: 3.52 cm2
AV Area mean vel: 3.62 cm2
AV Mean grad: 3 mmHg
AV Peak grad: 6.7 mmHg
Ao pk vel: 1.29 m/s
Area-P 1/2: 4.54 cm2
S' Lateral: 5.6 cm

## 2020-11-15 MED ORDER — PERFLUTREN LIPID MICROSPHERE
1.0000 mL | INTRAVENOUS | Status: AC | PRN
  Administered 2020-11-15: 2 mL via INTRAVENOUS

## 2020-11-16 ENCOUNTER — Telehealth: Payer: Self-pay | Admitting: Physician Assistant

## 2020-11-16 DIAGNOSIS — R0683 Snoring: Secondary | ICD-10-CM

## 2020-11-16 NOTE — Telephone Encounter (Signed)
While reviewing echo results in triage, pt states he was recently told by a friend he has sleep apnea.  Discussed the [place]. Pt would like to complete this before his visit with Dr. Fletcher Anon 12/2020. I informed him that we can go ahead and perform the questions below and have the watch available for him to pick up at the front desk when ready.  STOP-BANG [place] assessment  S-snore  Have you been told that you snore?   1  T-tired Are you often tired, fatigued, sleepy during the day?   1  O-obstruction Do you stop breathing, choke, or gasp during sleep?   1  P-pressure Do you have or are you being treated for high blood pressure?  0  B- BMI Is your BMI greater than 35 kg/m?  0  A- age Are you 4 years or older?  1  N-neck Do you have a neck circumference greater than 16 inches?  1  G-gender Are you male?   1  Total  6

## 2020-11-16 NOTE — Telephone Encounter (Addendum)
Reviewed the below in detail. Discussed optimization of GDMT. He recently was told by a friend he has sleep apnea. Discussed the [place]. He would like to complete this study before his next visit with Dr. Fletcher Anon. Completed the StopBang (6) and informed him we will leave the box for the WatchPat up front and notify him when ready.  He expressed his appreciation for the call.  ----- Message from Arvil Chaco, PA-C sent at 11/16/2020  7:58 AM EDT ----- Echo shows --EF slightly reduced 45-50% --LV not moving as well as it should and moderately to severely larger than normal size --RV and LA mildly larger than normal size

## 2020-11-16 NOTE — Addendum Note (Signed)
Addended by: Lamar Laundry on: 11/16/2020 11:29 AM   Modules accepted: Orders

## 2020-11-20 NOTE — Telephone Encounter (Addendum)
Order for [place] placed. WatchPat device registered and is awaiting prior auth approval.  Once approval has been received, patient can be called to come pick up the device and be given the activation pin. Patient will need to sign the patient responsibility form.

## 2020-11-22 NOTE — Telephone Encounter (Signed)
Brian Banks, CMA  No PA is required. Ok to activate PIN.

## 2020-11-23 NOTE — Telephone Encounter (Addendum)
Called the patient. Patient made aware that his WatchPAT device does not require a prior auth and is ready for pick up. Advised the patient that he will need to sign the patient responsibility form. Advised the patient that in the bag it will include the steps to download the app and use the device.  Patient sts that he will stop by the office this afternoon to pick it up. Adv him that we close at 5 pm.  Patient sts that he is driving and cannot write anything down. He will need to call back to get the pin activation code.  Pin code: [id number]

## 2020-11-24 NOTE — Telephone Encounter (Signed)
Patient came by office to pick up [place] Please call to discuss - he will need to go back over information

## 2020-11-24 NOTE — Telephone Encounter (Signed)
I called and spoke with the patient.  I have walked him through installing the [facility] ONE app on his phone.  I is aware to click on the "preview" button to walk through how to do the test. He is aware to not hit the "start" button until he is ready to do the actual test.  The patient voices understanding and is agreeable.

## 2020-11-27 NOTE — Telephone Encounter (Signed)
Patient is calling and states he has his watch set up for his sleep apnea, but it is asking for a PIN number. Please call to discuss.

## 2020-11-27 NOTE — Telephone Encounter (Signed)
I spoke with the patient and advised him of the PIN # for his sleep study. He voices understanding and is agreeable.

## 2020-11-28 ENCOUNTER — Encounter (INDEPENDENT_AMBULATORY_CARE_PROVIDER_SITE_OTHER): Payer: PPO | Admitting: Cardiology

## 2020-11-28 DIAGNOSIS — G4733 Obstructive sleep apnea (adult) (pediatric): Secondary | ICD-10-CM | POA: Diagnosis not present

## 2020-12-03 ENCOUNTER — Ambulatory Visit: Payer: PPO

## 2020-12-03 DIAGNOSIS — R0683 Snoring: Secondary | ICD-10-CM

## 2020-12-03 NOTE — Procedures (Signed)
   Sleep Study Report  Patient Information Study Date: 11/28/20 Patient Name: Brian Banks Patient ID: [id number] Birth Date: 05/19/2045 Age: 75 Gender: Male BMI: 32.8 (W=278 lb, H=6' 5'') Referring Physician: Jacqulyn Cane, MD  TEST DESCRIPTION: Home sleep apnea testing was completed using the WatchPat, a Type 1 device, utilizing peripheral arterial tonometry (PAT), chest movement, actigraphy, pulse oximetry, pulse rate, body position and snore. AHI was calculated with apnea and hypopnea using valid sleep time as the denominator. RDI includes apneas, hypopneas, and RERAs. The data acquired and the scoring of sleep and all associated events were performed in accordance with the recommended standards and specifications as outlined in the AASM Manual for the Scoring of Sleep and Associated Events 2.2.0 (2015).  FINDINGS: 1. Moderate Obstructive Sleep Apnea with AHI 27.2/hr. 2. No Central Sleep Apnea with pAHIc 1/hr. 3. Oxygen desaturations as low as 84%. 4. Moderate snoring was present. O2 sats were < 88% for 5.9 min. 5. Total sleep time was 1 hrs and 59 min. 6. 9.8 % of total sleep time was spent in REM sleep. 7. Shortened sleep onset latency at 6 min 8. Prolonged REM sleep onset latency at 138 min. 9. Total awakenings were 5 .  DIAGNOSIS: Moderate Obstructive Sleep Apnea (G47.33)  RECOMMENDATIONS: 1. Clinical correlation of these findings is necessary. The decision to treat obstructive sleep apnea (OSA) is usually based on the presence of apnea symptoms or the presence of associated medical conditions such as Hypertension, Congestive Heart Failure, Atrial Fibrillation or Obesity. The most common symptoms of OSA are snoring, gasping for breath while sleeping, daytime sleepiness and fatigue.  2. Initiating apnea therapy is recommended given the presence of symptoms and/or associated conditions. Recommend proceeding with one of the following:   a. Auto-CPAP therapy with a  pressure range of 5-20cm H2O.   b. An oral appliance (OA) that can be obtained from certain dentists with expertise in sleep medicine. These are primarily of use in non-obese patients with mild and moderate disease.   c. An ENT consultation which may be useful to look for specific causes of obstruction and possible treatment options.   d. If patient is intolerant to PAP therapy, consider referral to ENT for evaluation for hypoglossal nerve stimulator.  3. Close follow-up is necessary to ensure success with CPAP or oral appliance therapy for maximum benefit .  4. A follow-up oximetry study on CPAP is recommended to assess the adequacy of therapy and determine the need for supplemental oxygen or the potential need for Bi-level therapy. An arterial blood gas to determine the adequacy of baseline ventilation and oxygenation should also be considered.  5. Healthy sleep recommendations include: adequate nightly sleep (normal 7-9 hrs/night), avoidance of caffeine after noon and alcohol near bedtime, and maintaining a sleep environment that is cool, dark and quiet.  6. Weight loss for overweight patients is recommended. Even modest amounts of weight loss can significantly improve the severity of sleep apnea.  7. Snoring recommendations include: weight loss where appropriate, side sleeping, and avoidance of alcohol before bed.  8. Operation of motor vehicle should not be performed when sleepy.  Signature: Electronically Signed: 12/03/20 Fransico Him, MD; [facility]; [place], American Board of Sleep Medicine

## 2020-12-05 ENCOUNTER — Telehealth: Payer: Self-pay | Admitting: *Deleted

## 2020-12-05 DIAGNOSIS — G4733 Obstructive sleep apnea (adult) (pediatric): Secondary | ICD-10-CM

## 2020-12-05 NOTE — Telephone Encounter (Signed)
The patient has been notified of the result and verbalized understanding.  All questions (if any) were answered. Brian Banks, [place] 12/05/2020 4:49 PM    Upon patient request DME selection is Brian Banks Patient understands he will be contacted by [facility] to set up his cpap. Patient understands to call if Brian Banks does not contact him with new setup in a timely manner. Patient understands they will be called once confirmation has been received from [facility] that they have received their new machine to schedule 10 week follow up appointment.   [facility] notified of new cpap order  Please add to airview Patient was grateful for the call and thanked me.

## 2020-12-05 NOTE — Telephone Encounter (Signed)
-----   Message from Sueanne Margarita, MD sent at 12/03/2020  7:45 PM EDT ----- Please let patient know that they have sleep apnea and recommend treating with CPAP.  Please order an auto CPAP from 4-15cm H2O with heated humidity and mask of choice.  Order overnight pulse ox on CPAP.  Followup with me in 6 weeks.

## 2020-12-13 NOTE — Telephone Encounter (Signed)
I am not sure why this was sent to me as I handle the [place] location [place] studies. Looks like Jaynie Crumble study was ordered in our [place] office. I will forward to the [place] office, as well as FYI to Gershon Cull, [place].

## 2020-12-13 NOTE — Telephone Encounter (Signed)
Patient calling States he received the WatchPat but he has no instructions He also does not have the PIN #  Please call to discuss - best to call work [phone] til about 4p then call cell

## 2020-12-13 NOTE — Telephone Encounter (Signed)
I spoke with the patient. He states he completed his WatchPAT home sleep test already, but another device was delivered to his home today. He thought this was going to be his C-PAP, but he advised it is a "watch" with a pulse meter for the finger that came in a used box that had been taped up. He was confused as to what he needed to do with this.  I advised him it did not sound like a WatchPat device, but will need to have the sleep team reach out to him to clarify.  He is requesting a call back today on his cell # so he knows what to do with what was delivered to him.

## 2020-12-14 NOTE — Telephone Encounter (Signed)
The patient had already had a WatchPAT done and had been called by Gae Bon on 12/05/20 about setting up a CPAP. I'm wondering if what he was calling about had something to do with the CPAP itself. I am not certain about any of the equipment. Please call the patient to discuss.

## 2020-12-15 ENCOUNTER — Other Ambulatory Visit: Payer: Self-pay

## 2020-12-15 ENCOUNTER — Ambulatory Visit (INDEPENDENT_AMBULATORY_CARE_PROVIDER_SITE_OTHER): Payer: PPO | Admitting: Cardiovascular Disease

## 2020-12-15 ENCOUNTER — Encounter: Payer: Self-pay | Admitting: Cardiovascular Disease

## 2020-12-15 VITALS — BP 138/70 | HR 66 | Ht 77.0 in | Wt 281.0 lb

## 2020-12-15 DIAGNOSIS — I2510 Atherosclerotic heart disease of native coronary artery without angina pectoris: Secondary | ICD-10-CM | POA: Diagnosis not present

## 2020-12-15 DIAGNOSIS — I5032 Chronic diastolic (congestive) heart failure: Secondary | ICD-10-CM | POA: Diagnosis not present

## 2020-12-15 DIAGNOSIS — I493 Ventricular premature depolarization: Secondary | ICD-10-CM

## 2020-12-15 DIAGNOSIS — E785 Hyperlipidemia, unspecified: Secondary | ICD-10-CM | POA: Diagnosis not present

## 2020-12-15 DIAGNOSIS — I10 Essential (primary) hypertension: Secondary | ICD-10-CM | POA: Diagnosis not present

## 2020-12-15 MED ORDER — ROSUVASTATIN CALCIUM 20 MG PO TABS: 20.0000 mg | ORAL_TABLET | Freq: Every day | ORAL | 3 refills | Status: DC

## 2020-12-15 MED ORDER — METOPROLOL SUCCINATE ER 25 MG PO TB24: 25.0000 mg | ORAL_TABLET | Freq: Every day | ORAL | 3 refills | Status: DC

## 2020-12-15 MED ORDER — CLOPIDOGREL BISULFATE 75 MG PO TABS: 75.0000 mg | ORAL_TABLET | Freq: Every day | ORAL | 3 refills | Status: DC

## 2020-12-15 NOTE — Patient Instructions (Signed)
Medication Instructions:  - Your physician recommends that you continue on your current medications as directed. Please refer to the Current Medication list given to you today.  - Refills have been sent to the pharmacy for plavix (clopidigrel), metoprolol, and crestor (rosuvastatin)  *If you need a refill on your cardiac medications before your next appointment, please call your pharmacy*   Lab Work: - none ordered  If you have labs (blood work) drawn today and your tests are completely normal, you will receive your results only by: [place] (if you have MyChart) OR A paper copy in the mail If you have any lab test that is abnormal or we need to change your treatment, we will call you to review the results.   Testing/Procedures: - none ordered   Follow-Up: At [facility], you and your health needs are our priority.  As part of our continuing mission to provide you with exceptional heart care, we have created designated Provider Care Teams.  These Care Teams include your primary Cardiologist (physician) and Advanced Practice Providers (APPs -  Physician Assistants and Nurse Practitioners) who all work together to provide you with the care you need, when you need it.  We recommend signing up for the patient portal called "MyChart".  Sign up information is provided on this After Visit Summary.  MyChart is used to connect with patients for Virtual Visits (Telemedicine).  Patients are able to view lab/test results, encounter notes, upcoming appointments, etc.  Non-urgent messages can be sent to your provider as well.   To learn more about what you can do with MyChart, go to [email or web address].    Your next appointment:   6 month(s)  The format for your next appointment:   In Person  Provider:   You may see [name], MD or one of the following Advanced Practice Providers on your designated Care Team:   Murray Hodgkins, NP Christell Faith, PA-C Cadence Kathlen Mody, [place]     Other Instructions N/a

## 2020-12-15 NOTE — Telephone Encounter (Signed)
Reached out to patient and confirmed with him that a pulse oximeter test was brought to him not another watchpat test. Patient understands the test is to be taken when he gets his cpap. The pulse ox test was returned until he gets his cpap machine.

## 2020-12-15 NOTE — Progress Notes (Signed)
Cardiology Office Note   Date:  12/15/2020   ID:  Brian Banks, DOB 02/22/1945, MRN [id number]  PCP:  Adin Hector, MD  Cardiologist:   [name], MD   Chief Complaint  Patient presents with   Other    3 month f/u discuss meds pt is confused about which meds he should be taking and would like to discuss sleep study process/cost. Meds reviewed verbally with pt.      History of Present Illness: Brian Banks is a 75 y.o. male who presents for a follow-up visit regarding coronary artery disease status post CABG in June. He has known history of hyperlipidemia, GERD, borderline diabetes, obesity and cigar use. He presented in June with unstable angina.  Cardiac catheterization showed left dominant coronary arteries with moderate left main disease, critical ostial LAD stenosis with tandem lesions in the proximal segment and significant ostial left PDA stenosis.  The LAD was moderately to severely calcified with extension of the plaque into the distal left main making stenting of the LAD not possible without jailing the dominant left circumflex.  Ejection fraction was normal with mildly elevated left ventricular end-diastolic pressure.  Based on this, I recommended CABG.  The patient underwent CABG at [facility] with LIMA to LAD, SVG to left PDA and SVG to OM 2.  The patient had blood loss anemia that required transfusion and also had PVCs that were treated with amiodarone. He was recently diagnosed with sleep apnea on a home sleep study test.  He is supposed to start CPAP.  He denies chest pain or shortness of breath.  He is not sure if he is taking some of his medications including clopidogrel, metoprolol and rosuvastatin.    Past Medical History:  Diagnosis Date   Benign neoplasm of large bowel    BPH with obstruction/lower urinary tract symptoms    Dysphagia    Erectile dysfunction    Heartburn    History of kidney stones    History of stomach ulcers    HLD  (hyperlipidemia)    Hypogonadism in male    Obesity    Urinary frequency    Urinary urgency    Vitamin D deficiency     Past Surgical History:  Procedure Laterality Date   APPENDECTOMY     CORONARY ARTERY BYPASS GRAFT N/A 07/19/2020   Procedure: CORONARY ARTERY BYPASS GRAFTING (CABG) TIMES THREE ON PUMP USING LEFT INTERNAL MAMMARY ARTERY AND ENDOSCOPICALLY HARVESTED RIGHT GREATER SAPHENOUS VEIN;  Surgeon: Wonda Olds, MD;  Location: [facility];  Service: Open Heart Surgery;  Laterality: N/A;   ENDOVEIN HARVEST OF GREATER SAPHENOUS VEIN Right 07/19/2020   Procedure: ENDOVEIN HARVEST OF GREATER SAPHENOUS VEIN;  Surgeon: Wonda Olds, MD;  Location: [facility];  Service: Open Heart Surgery;  Laterality: Right;   LEFT HEART CATH AND CORONARY ANGIOGRAPHY N/A 07/18/2020   Procedure: LEFT HEART CATH AND CORONARY ANGIOGRAPHY;  Surgeon: Wellington Hampshire, MD;  Location: [facility];  Service: Cardiovascular;  Laterality: N/A;   TEE WITHOUT CARDIOVERSION N/A 07/19/2020   Procedure: TRANSESOPHAGEAL ECHOCARDIOGRAM (TEE);  Surgeon: Wonda Olds, MD;  Location: [place];  Service: Open Heart Surgery;  Laterality: N/A;     Current Outpatient Medications  Medication Sig Dispense Refill   aspirin EC 81 MG EC tablet Take 1 tablet (81 mg total) by mouth daily. Swallow whole.     finasteride (PROSCAR) 5 MG tablet Take 1 tablet (5 mg total) by mouth daily  with supper.     pantoprazole (PROTONIX) 40 MG tablet Take 1 tablet (40 mg total) by mouth daily. 90 tablet 1   clopidogrel (PLAVIX) 75 MG tablet Take 1 tablet (75 mg total) by mouth daily. (Patient not taking: Reported on 12/15/2020) 90 tablet 3   metoprolol succinate (TOPROL XL) 25 MG 24 hr tablet Take 1 tablet (25 mg total) by mouth daily. (Patient not taking: Reported on 12/15/2020) 90 tablet 3   rosuvastatin (CRESTOR) 20 MG tablet Take 1 tablet (20 mg total) by mouth daily. (Patient not taking: Reported on 12/15/2020) 30 tablet 1   No current  facility-administered medications for this visit.    Allergies:   Lovastatin, Quinolones, and Androgel [testosterone]    Social History:  The patient  reports that he has been smoking cigars. He has never used smokeless tobacco. He reports current alcohol use. He reports that he does not use drugs.   Family History:  The patient's family history includes Alzheimer's disease in his father; Diabetes Mellitus II in his mother; Prostate cancer in his brother; Stroke in his mother.    ROS:  Please see the history of present illness.   Otherwise, review of systems are positive for none.   All other systems are reviewed and negative.    PHYSICAL EXAM: VS:  BP 138/70 (BP Location: Left Arm, Patient Position: Sitting, Cuff Size: Large)   Pulse 66   Ht 6\' 5"  (4.132 m)   Wt 281 lb (127.5 kg)   SpO2 99%   BMI 33.32 kg/m  , BMI Body mass index is 33.32 kg/m. GEN: Well nourished, well developed, in no acute distress  HEENT: normal  Neck: no JVD, carotid bruits, or masses Cardiac: RRR; no murmurs, rubs, or gallops, trace edema  Respiratory:  clear to auscultation bilaterally, normal work of breathing GI: soft, nontender, nondistended, + BS MS: no deformity or atrophy  Skin: warm and dry, no rash Neuro:  Strength and sensation are intact Psych: euthymic mood, full affect   EKG:  EKG is ordered today. 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   Recent Labs: 07/18/2020: ALT 20 07/20/2020: Magnesium 2.1 07/24/2020: BUN 25; Creatinine, Ser 0.95; Potassium 3.9; Sodium 136 09/25/2020: Hemoglobin 12.8; Platelets 235    Lipid Panel    Component Value Date/Time   CHOL 179 07/18/2020 1019   TRIG 95 07/18/2020 1019   HDL 43 07/18/2020 1019   CHOLHDL 4.2 07/18/2020 1019   VLDL 19 07/18/2020 1019   LDLCALC 117 (H) 07/18/2020 1019      Wt Readings from Last 3 Encounters:  12/15/20 281 lb (127.5 kg)  09/14/20 278  lb (126.1 kg)  08/15/20 273 lb (123.8 kg)       No flowsheet data found.    ASSESSMENT AND PLAN:  1.  Coronary artery disease involving native coronary artery status post CABG: He is overall doing very well with no anginal symptoms.    Continue dual antiplatelet therapy until June 2023. Clopidogrel Can be discontinued at that time.  We refilled his clopidogrel today.  2.  Chronic diastolic heart failure: He appears to be euvolemic without any diuretics.  3.  Frequent PVCs: No PVCs noted today.   4.  Hyperlipidemia: Most recent lipid profile showed an LDL of 50.  Rosuvastatin was refilled.  5.  Essential hypertension: Blood pressures controlled.  6.  History of cigar use: He usually smokes 1 cigar a  day   7.  Recently diagnosed sleep apnea, he is supposed to start CPAP treatment soon.   Disposition:   FU with me in 6 months  Signed,  [name], MD  12/15/2020 9:23 AM    [place]

## 2020-12-15 NOTE — Telephone Encounter (Signed)
Noted  

## 2021-01-10 NOTE — Telephone Encounter (Signed)
Patient calling states he has not received his cpap machine

## 2021-01-10 NOTE — Telephone Encounter (Signed)
Please call [place] at [phone] to inquire about your cpap.

## 2021-01-17 DIAGNOSIS — G4733 Obstructive sleep apnea (adult) (pediatric): Secondary | ICD-10-CM | POA: Diagnosis not present

## 2021-01-18 DIAGNOSIS — I2510 Atherosclerotic heart disease of native coronary artery without angina pectoris: Secondary | ICD-10-CM | POA: Diagnosis not present

## 2021-01-18 DIAGNOSIS — J069 Acute upper respiratory infection, unspecified: Secondary | ICD-10-CM | POA: Diagnosis not present

## 2021-01-18 DIAGNOSIS — J90 Pleural effusion, not elsewhere classified: Secondary | ICD-10-CM | POA: Diagnosis not present

## 2021-01-18 DIAGNOSIS — R042 Hemoptysis: Secondary | ICD-10-CM | POA: Diagnosis not present

## 2021-01-18 DIAGNOSIS — G4733 Obstructive sleep apnea (adult) (pediatric): Secondary | ICD-10-CM | POA: Diagnosis not present

## 2021-01-29 ENCOUNTER — Telehealth: Payer: Self-pay | Admitting: Cardiovascular Disease

## 2021-01-29 DIAGNOSIS — Z800 Family history of malignant neoplasm of digestive organs: Secondary | ICD-10-CM | POA: Diagnosis not present

## 2021-01-29 DIAGNOSIS — Z86010 Personal history of colonic polyps: Secondary | ICD-10-CM | POA: Diagnosis not present

## 2021-01-29 DIAGNOSIS — K219 Gastro-esophageal reflux disease without esophagitis: Secondary | ICD-10-CM | POA: Diagnosis not present

## 2021-01-29 NOTE — Telephone Encounter (Signed)
° °  Pre-operative Risk Assessment    Patient Name: Brian Banks  DOB: 21-Aug-1945 MRN: [id number]      Request for Surgical Clearance    Procedure:   Colonoscopy  Date of Surgery:  Clearance 03/05/21                                 Surgeon:  [place] or Practice Name:  Sharol Roussel  Phone number:  [phone] Fax number:  [phone]   Type of Clearance Requested:   - Medical  and pharmacy    Type of Anesthesia:  Not Indicated   Additional requests/questions:   Hold plavix x 5 days prior If pt had complex polypectomy blood thinning meds will be held atleast 5 days post op If anti coag must be continued or you disagree please indicate rationale    Jonathon Jordan   01/29/2021, 1:19 PM

## 2021-01-30 NOTE — Telephone Encounter (Signed)
Per Dr. Fletcher Anon, recommendation is to "continue dual antiplatelet therapy until June 2023" as pt is post CABG in 07/2020.   Covering staff, can you reach out to requesting provider to determine whether this procedure is urgent or not? Current request from surgeon is to "hold Clopidogrel x 5 days prior and if pt had complex polypectomy blood thinning meds will be held atleast 5 days post op."   Thank you.   Lenna Sciara, NP

## 2021-01-30 NOTE — Telephone Encounter (Signed)
Thank you Dr. Virgina Jock for your reply. I will forward notes back to our pre op provider. Merry Christmas sir.   Will have Dr. Keane Police office call the pt and let the pt know that procedure will need to be held off at this time, see previous notes.

## 2021-01-30 NOTE — Telephone Encounter (Signed)
I tried to reach the requesting office, in regard to notes from pre op provider Diona Browner, NP today. See notes with question of urgency. I was on hold x 7 + minutes with no answer. I will fax these notes to requesting office asking if they will please reply to the question being asked by our pre op provider.

## 2021-02-17 DIAGNOSIS — G4733 Obstructive sleep apnea (adult) (pediatric): Secondary | ICD-10-CM | POA: Diagnosis not present

## 2021-02-20 ENCOUNTER — Other Ambulatory Visit: Payer: Self-pay

## 2021-02-20 ENCOUNTER — Telehealth (INDEPENDENT_AMBULATORY_CARE_PROVIDER_SITE_OTHER): Payer: PPO | Admitting: Cardiology

## 2021-02-20 ENCOUNTER — Encounter: Payer: Self-pay | Admitting: Cardiology

## 2021-02-20 VITALS — Ht 77.0 in | Wt 280.0 lb

## 2021-02-20 DIAGNOSIS — G4733 Obstructive sleep apnea (adult) (pediatric): Secondary | ICD-10-CM

## 2021-02-20 NOTE — Patient Instructions (Signed)
Medication Instructions:  Your physician recommends that you continue on your current medications as directed. Please refer to the Current Medication list given to you today.  *If you need a refill on your cardiac medications before your next appointment, please call your pharmacy*   Lab Work: NONE   If you have labs (blood work) drawn today and your tests are completely normal, you will receive your results only by: [place] (if you have MyChart) OR A paper copy in the mail If you have any lab test that is abnormal or we need to change your treatment, we will call you to review the results.   Testing/Procedures: NONE    Follow-Up: At [facility], you and your health needs are our priority.  As part of our continuing mission to provide you with exceptional heart care, we have created designated Provider Care Teams.  These Care Teams include your primary Cardiologist (physician) and Advanced Practice Providers (APPs -  Physician Assistants and Nurse Practitioners) who all work together to provide you with the care you need, when you need it.  We recommend signing up for the patient portal called "MyChart".  Sign up information is provided on this After Visit Summary.  MyChart is used to connect with patients for Virtual Visits (Telemedicine).  Patients are able to view lab/test results, encounter notes, upcoming appointments, etc.  Non-urgent messages can be sent to your provider as well.   To learn more about what you can do with MyChart, go to [email or web address].    Your next appointment:   1 year(s)  The format for your next appointment:   In Person  Provider:   Dr. Radford Pax       Other Instructions Thank you for choosing [facility]!

## 2021-02-20 NOTE — Progress Notes (Signed)
Virtual Visit via Video Note   This visit type was conducted due to national recommendations for restrictions regarding the COVID-19 Pandemic (e.g. social distancing) in an effort to limit this patient's exposure and mitigate transmission in our community.  Due to his co-morbid illnesses, this patient is at least at moderate risk for complications without adequate follow up.  This format is felt to be most appropriate for this patient at this time.  All issues noted in this document were discussed and addressed.  A limited physical exam was performed with this format.  Please refer to the patient's chart for his consent to telehealth for [facility].   Date:  02/20/2021   ID:  Brian Banks, DOB [birth year], MRN [id number] The patient was identified using 2 identifiers.  Patient Location: Home Provider Location: Home Office   PCP:  Adin Hector, MD   [facility] Providers Cardiologist:  [name], MD     Evaluation Performed:  Follow-Up Visit  Chief Complaint:  OSA  History of Present Illness:    Brian Banks is a 76 y.o. male with a hx of GERD, HLD and CAD who was referred for Home sleep study due to a friend telling him that he snored a lot.  He says that his girlfriend says that he snores and falls asleep in the chair.  His StopBang score was 6. His HST showed moderate OSA with an AHI of 27.2/hr and nocturnal hypoxemia with O2 sats <88% for 6 min.  He was started on auto CPAP and is now here for folloup.   He is doing well with his CPAP device and thinks that he has gotten used to it. He says that sometimes around 3am the mask will hurt his nose and wake him up and he took it off.  He tolerates the mask fairly well and feels the pressure is adequate.  Since going on CPAP he has not really noticed feeling any different in the am but has not had any significant daytime sleepiness.  He gets up at 5am to use the restroom and does not put it back on.  He denies any  significant mouth or nasal dryness or nasal congestion.  He does not think that he snores.     The patient does not have symptoms concerning for COVID-19 infection (fever, chills, cough, or new shortness of breath).    Past Medical History:  Diagnosis Date   Benign neoplasm of large bowel    BPH with obstruction/lower urinary tract symptoms    Dysphagia    Erectile dysfunction    Heartburn    History of kidney stones    History of stomach ulcers    HLD (hyperlipidemia)    Hypogonadism in male    Obesity    Urinary frequency    Urinary urgency    Vitamin D deficiency    Past Surgical History:  Procedure Laterality Date   APPENDECTOMY     CORONARY ARTERY BYPASS GRAFT N/A 07/19/2020   Procedure: CORONARY ARTERY BYPASS GRAFTING (CABG) TIMES THREE ON PUMP USING LEFT INTERNAL MAMMARY ARTERY AND ENDOSCOPICALLY HARVESTED RIGHT GREATER SAPHENOUS VEIN;  Surgeon: Wonda Olds, MD;  Location: [place];  Service: Open Heart Surgery;  Laterality: N/A;   ENDOVEIN HARVEST OF GREATER SAPHENOUS VEIN Right 07/19/2020   Procedure: ENDOVEIN HARVEST OF GREATER SAPHENOUS VEIN;  Surgeon: Wonda Olds, MD;  Location: [place];  Service: Open Heart Surgery;  Laterality: Right;   LEFT HEART CATH AND  CORONARY ANGIOGRAPHY N/A 07/18/2020   Procedure: LEFT HEART CATH AND CORONARY ANGIOGRAPHY;  Surgeon: Wellington Hampshire, MD;  Location: [place] [facility];  Service: Cardiovascular;  Laterality: N/A;   TEE WITHOUT CARDIOVERSION N/A 07/19/2020   Procedure: TRANSESOPHAGEAL ECHOCARDIOGRAM (TEE);  Surgeon: Wonda Olds, MD;  Location: [place];  Service: Open Heart Surgery;  Laterality: N/A;     Current Meds  Medication Sig   aspirin EC 81 MG EC tablet Take 1 tablet (81 mg total) by mouth daily. Swallow whole.   clopidogrel (PLAVIX) 75 MG tablet Take 1 tablet (75 mg total) by mouth daily.   finasteride (PROSCAR) 5 MG tablet Take 1 tablet (5 mg total) by mouth daily with supper.   metoprolol succinate (TOPROL XL)  25 MG 24 hr tablet Take 1 tablet (25 mg total) by mouth daily.   pantoprazole (PROTONIX) 40 MG tablet Take 1 tablet (40 mg total) by mouth daily.   rosuvastatin (CRESTOR) 20 MG tablet Take 1 tablet (20 mg total) by mouth daily.     Allergies:   Lovastatin, Quinolones, and Androgel [testosterone]   Social History   Tobacco Use   Smoking status: Some Days    Types: Cigars   Smokeless tobacco: Never  Vaping Use   Vaping Use: Never used  Substance Use Topics   Alcohol use: Not Currently    Comment: Rare   Drug use: No     Family Hx: The patient's family history includes Alzheimer's disease in his father; Diabetes Mellitus II in his mother; Prostate cancer in his brother; Stroke in his mother.  ROS:   Please see the history of present illness.     All other systems reviewed and are negative.   Prior CV studies:   The following studies were reviewed today:  Home sleep study and PAP compliance download  Labs/Other Tests and Data Reviewed:    EKG:  No ECG reviewed.  Recent Labs: 07/18/2020: ALT 20 07/20/2020: Magnesium 2.1 07/24/2020: BUN 25; Creatinine, Ser 0.95; Potassium 3.9; Sodium 136 09/25/2020: Hemoglobin 12.8; Platelets 235   Recent Lipid Panel Lab Results  Component Value Date/Time   CHOL 179 07/18/2020 10:19 AM   TRIG 95 07/18/2020 10:19 AM   HDL 43 07/18/2020 10:19 AM   CHOLHDL 4.2 07/18/2020 10:19 AM   LDLCALC 117 (H) 07/18/2020 10:19 AM    Wt Readings from Last 3 Encounters:  02/20/21 280 lb (127 kg)  12/15/20 281 lb (127.5 kg)  09/14/20 278 lb (126.1 kg)     Risk Assessment/Calculations:          Objective:    Vital Signs:  Ht 6\' 5"  (1.956 m)    Wt 280 lb (127 kg)    BMI 33.20 kg/m    VITAL SIGNS:  reviewed GEN:  no acute distress EYES:  sclerae anicteric, EOMI - Extraocular Movements Intact RESPIRATORY:  normal respiratory effort, symmetric expansion CARDIOVASCULAR:  no peripheral edema SKIN:  no rash, lesions or ulcers. MUSCULOSKELETAL:   no obvious deformities. NEURO:  alert and oriented x 3, no obvious focal deficit PSYCH:  normal affect  ASSESSMENT & PLAN:    OSA - The patient is tolerating PAP therapy well without any problems. The PAP download performed by his DME was personally reviewed and interpreted by me today and showed an AHI of 0.9/hr on auto CPAP with 30% compliance in using more than 4 hours nightly.  The patient has been using and benefiting from PAP use and will continue to benefit from  therapy.  -I encouraged him to try to be more compliant with his device.  2.  HTN -BP is controlled at home -continue prescription drug management with Toprol XL 25mg  daily with PRN refills  COVID-19 Education: The signs and symptoms of COVID-19 were discussed with the patient and how to seek care for testing (follow up with PCP or arrange E-visit).  The importance of social distancing was discussed today.  Time:   Today, I have spent 20 minutes with the patient with telehealth technology discussing the above problems.     Medication Adjustments/Labs and Tests Ordered: Current medicines are reviewed at length with the patient today.  Concerns regarding medicines are outlined above.   Tests Ordered: No orders of the defined types were placed in this encounter.   Medication Changes: No orders of the defined types were placed in this encounter.   Follow Up:  In Person in 1 year(s)  Signed, Fransico Him, MD  02/20/2021 8:16 AM

## 2021-02-26 ENCOUNTER — Other Ambulatory Visit: Payer: Self-pay

## 2021-02-26 DIAGNOSIS — N401 Enlarged prostate with lower urinary tract symptoms: Secondary | ICD-10-CM

## 2021-02-26 DIAGNOSIS — N138 Other obstructive and reflux uropathy: Secondary | ICD-10-CM

## 2021-02-26 DIAGNOSIS — N3941 Urge incontinence: Secondary | ICD-10-CM

## 2021-02-27 ENCOUNTER — Other Ambulatory Visit: Payer: PPO

## 2021-02-27 ENCOUNTER — Other Ambulatory Visit: Payer: Self-pay

## 2021-02-27 DIAGNOSIS — N138 Other obstructive and reflux uropathy: Secondary | ICD-10-CM | POA: Diagnosis not present

## 2021-02-27 DIAGNOSIS — N401 Enlarged prostate with lower urinary tract symptoms: Secondary | ICD-10-CM | POA: Diagnosis not present

## 2021-02-28 LAB — PSA: Prostate Specific Ag, Serum: 0.6 ng/mL (ref 0.0–4.0)

## 2021-03-05 NOTE — Progress Notes (Signed)
03/06/2021  9:03 AM   Brian Banks Aug 03, 1945 [id number]  Referring provider: Adin Hector, MD [place] [facility] [place],  [place] [address]  Chief Complaint  Patient presents with   Benign Prostatic Hypertrophy   Urinary Incontinence   Urological history 1. BPH with LU TS -PSA 0.6 02/27/2021 -cysto 03/2019- moderate lateral lobe enlargement prostate - mild elevation bladder neck -managed with finasteride 5 mg daily   2. Urge incontinence -contributing factors of age and BPH -not a candidate for anticholinergic therapy due to age and dry eyes  -failed Myrbetriq  -treated with PTNS  HPI: Brian Banks is a 76 y.o. male who presents today for one year follow up.   He has been diagnosed with sleep apnea since his last visit with [place] and is sleeping with a CPAP machine.    He had some issues with urge incontinence that lasted a couple of weeks one month ago.  He does not endorse any changes in medications, issues with blood sugars, new OTC medications or changes in diet.  He was going through 5 to 6 pairs of underwear to keep dry.  He did have a URI at that time and was taking an antibiotic,  Alka seltzer plus  and Mucinex D.    Patient denies any modifying or aggravating factors.  Patient denies any gross hematuria, dysuria or suprapubic/flank pain.  Patient denies any fevers, chills, nausea or vomiting.       IPSS     Row Name 03/06/21 0800         International Prostate Symptom Score   How often have you had the sensation of not emptying your bladder? Not at All     How often have you had to urinate less than every two hours? More than half the time     How often have you found you stopped and started again several times when you urinated? Not at All     How often have you found it difficult to postpone urination? More than half the time     How often have you had a weak urinary stream? Less than half the time     How often have you had  to strain to start urination? Not at All     How many times did you typically get up at night to urinate? 2 Times     Total IPSS Score 12       Quality of Life due to urinary symptoms   If you were to spend the rest of your life with your urinary condition just the way it is now how would you feel about that? Mixed                Score:  1-7 Mild 8-19 Moderate 20-35 Severe   PMH: Past Medical History:  Diagnosis Date   Benign neoplasm of large bowel    BPH with obstruction/lower urinary tract symptoms    Dysphagia    Erectile dysfunction    Heartburn    History of kidney stones    History of stomach ulcers    HLD (hyperlipidemia)    Hypogonadism in male    Obesity    Urinary frequency    Urinary urgency    Vitamin D deficiency     Surgical History: Past Surgical History:  Procedure Laterality Date   APPENDECTOMY     CORONARY ARTERY BYPASS GRAFT N/A 07/19/2020   Procedure: CORONARY ARTERY BYPASS GRAFTING (  CABG) TIMES THREE ON PUMP USING LEFT INTERNAL MAMMARY ARTERY AND ENDOSCOPICALLY HARVESTED RIGHT GREATER SAPHENOUS VEIN;  Surgeon: Wonda Olds, MD;  Location: [place];  Service: Open Heart Surgery;  Laterality: N/A;   ENDOVEIN HARVEST OF GREATER SAPHENOUS VEIN Right 07/19/2020   Procedure: ENDOVEIN HARVEST OF GREATER SAPHENOUS VEIN;  Surgeon: Wonda Olds, MD;  Location: [place];  Service: Open Heart Surgery;  Laterality: Right;   LEFT HEART CATH AND CORONARY ANGIOGRAPHY N/A 07/18/2020   Procedure: LEFT HEART CATH AND CORONARY ANGIOGRAPHY;  Surgeon: Wellington Hampshire, MD;  Location: [place] [facility];  Service: Cardiovascular;  Laterality: N/A;   TEE WITHOUT CARDIOVERSION N/A 07/19/2020   Procedure: TRANSESOPHAGEAL ECHOCARDIOGRAM (TEE);  Surgeon: Wonda Olds, MD;  Location: [place];  Service: Open Heart Surgery;  Laterality: N/A;    Home Medications:  Allergies as of 03/06/2021       Reactions   Lovastatin    Reports statin intolerance/myalgias.   Tolerates Crestor 20 mg daily.   Quinolones    Contraindicated given aortic dilation   Androgel [testosterone] Rash        Medication List        Accurate as of March 06, 2021  9:03 AM. If you have any questions, ask your nurse or doctor.          aspirin 81 MG EC tablet Take 1 tablet (81 mg total) by mouth daily. Swallow whole.   clopidogrel 75 MG tablet Commonly known as: PLAVIX Take 1 tablet (75 mg total) by mouth daily.   finasteride 5 MG tablet Commonly known as: PROSCAR Take 1 tablet (5 mg total) by mouth daily with supper.   metoprolol succinate 25 MG 24 hr tablet Commonly known as: Toprol XL Take 1 tablet (25 mg total) by mouth daily.   pantoprazole 40 MG tablet Commonly known as: PROTONIX Take 1 tablet (40 mg total) by mouth daily.   rosuvastatin 20 MG tablet Commonly known as: CRESTOR Take 1 tablet (20 mg total) by mouth daily.        Allergies:  Allergies  Allergen Reactions   Lovastatin     Reports statin intolerance/myalgias.  Tolerates Crestor 20 mg daily.   Quinolones     Contraindicated given aortic dilation   Androgel [Testosterone] Rash    Family History: Family History  Problem Relation Age of Onset   Alzheimer's disease Father    Prostate cancer Brother    Diabetes Mellitus II Mother    Stroke Mother     Social History:  reports that he has been smoking cigars. He has never used smokeless tobacco. He reports that he does not currently use alcohol. He reports that he does not use drugs.  For pertinent review of systems please refer to history of present illness  Physical Exam: BP 135/80    Pulse 73    Ht 6' 5"  (1.956 m)    Wt 283 lb (128.4 kg)    BMI 33.56 kg/m   Constitutional:  Well nourished. Alert and oriented, No acute distress. HEENT: [place] AT, mask in place.  Trachea midline Cardiovascular: No clubbing, cyanosis, or edema. Respiratory: Normal respiratory effort, no increased work of breathing. GU: No CVA tenderness.   No bladder fullness or masses.  Patient with uncircumcised phallus. Foreskin easily retracted  Urethral meatus is patent.  No penile discharge. No penile lesions or rashes. Scrotum without lesions, cysts, rashes and/or edema.  Testicles are located scrotally bilaterally. No masses are appreciated in the  testicles. Left and right epididymis are normal. Rectal: Patient with  normal sphincter tone. Anus and perineum without scarring or rashes. No rectal masses are appreciated. Prostate is approximately 50 + grams, could only palpate the apex and midportion of the gland, no nodules are appreciated. Seminal vesicles could not be palpated. Neurologic: Grossly intact, no focal deficits, moving all 4 extremities. Psychiatric: Normal mood and affect.   Laboratory Data: Component     Latest Ref Rng & Units 12/20/2016 12/31/2017 02/22/2019 03/03/2020  Prostate Specific Ag, Serum     0.0 - 4.0 ng/mL 0.8 0.7 0.5 0.7   Component     Latest Ref Rng & Units 02/27/2021  Prostate Specific Ag, Serum     0.0 - 4.0 ng/mL 0.6   Component     Latest Ref Rng & Units 09/25/2020          WBC     4.0 - 10.5 K/uL 6.6  RBC     4.22 - 5.81 MIL/uL 4.38  Hemoglobin     13.0 - 17.0 g/dL 12.8 (L)  HCT     39.0 - 52.0 % 38.0 (L)  MCV     80.0 - 100.0 fL 86.8  MCH     26.0 - 34.0 pg 29.2  MCHC     30.0 - 36.0 g/dL 33.7  RDW     11.5 - 15.5 % 13.2  Platelets     150 - 400 K/uL 235  nRBC     0.0 - 0.2 % 0.0  Neutrophils     %   NEUT#     1.7 - 7.7 K/uL   Lymphocytes     %   Lymphocyte #     0.7 - 4.0 K/uL   Monocytes Relative     %   Monocyte #     0.1 - 1.0 K/uL   Eosinophil     %   Eosinophils Absolute     0.0 - 0.5 K/uL   Basophil     %   Basophils Absolute     0.0 - 0.1 K/uL   Immature Granulocytes     %   Abs Immature Granulocytes     0.00 - 0.07 K/uL   pH, Arterial     7.350 - 7.450   pCO2 arterial     32.0 - 48.0 mmHg   pO2, Arterial     83.0 - 108.0 mmHg   Bicarbonate     20.0 -  28.0 mmol/L   TCO2     22 - 32 mmol/L   O2 Saturation     %   Acid-base deficit     0.0 - 2.0 mmol/L   Sodium     135 - 145 mmol/L   Potassium     3.5 - 5.1 mmol/L   Calcium Ionized     1.15 - 1.40 mmol/L   Patient temperature        Collection site        Drawn by        Sample type        Acid-Base Excess     0.0 - 2.0 mmol/L   pH, Ven     7.250 - 7.430   pCO2, Ven     44.0 - 60.0 mmHg   pO2, Ven     32.0 - 45.0 mmHg   Chloride     98 - 111 mmol/L   BUN     8 - 23  mg/dL   Creatinine     0.61 - 1.24 mg/dL   Glucose     70 - 99 mg/dL   WBC, UA     0 - 5 /hpf   Epithelial Cells (non renal)     0 - 10 /hpf   Bacteria, UA     None seen/Few    Glucose 70 - 110 mg/dL 109   Sodium 136 - 145 mmol/L 141   Potassium 3.6 - 5.1 mmol/L 4.6   Chloride 97 - 109 mmol/L 107   Carbon Dioxide (CO2) 22.0 - 32.0 mmol/L 28.2   Urea Nitrogen (BUN) 7 - 25 mg/dL 20   Creatinine 0.7 - 1.3 mg/dL 1.2   Glomerular Filtration Rate (eGFR), MDRD Estimate >60 mL/min/1.73sq m 59 Low    Calcium 8.7 - 10.3 mg/dL 8.7   AST  8 - 39 U/L 10   ALT  6 - 57 U/L 8   Alk Phos (alkaline Phosphatase) 34 - 104 U/L 57   Albumin 3.5 - 4.8 g/dL 4.1   Bilirubin, Total 0.3 - 1.2 mg/dL 0.4   Protein, Total 6.1 - 7.9 g/dL 6.3   A/G Ratio 1.0 - 5.0 gm/dL 1.9   Resulting Agency  [facility] - [facility]  Specimen Collected: 08/29/20 07:28 Last Resulted: 08/29/20 11:17  Received From: [place]  Result Received: 09/08/20 16:36   Hemoglobin A1C 4.2 - 5.6 % 5.6   Average Blood Glucose (Calc) mg/dL 114   Resulting Agency  [place] - [facility]  Narrative Performed by [facility] - [facility] Normal Range:    4.2 - 5.6%  Increased Risk:  5.7 - 6.4%  Diabetes:        >= 6.5%  Glycemic Control for adults with diabetes:  <7%   Specimen Collected: 08/29/20 07:28 Last Resulted: 08/29/20 09:40  Received From: [place]  Result Received: 09/08/20 16:36    Cholesterol, Total 100 - 200 mg/dL 112   Triglyceride 35 - 199 mg/dL 94   HDL (High Density Lipoprotein) Cholesterol 29.0 - 71.0 mg/dL 42.9   LDL Calculated 0 - 130 mg/dL 50   VLDL Cholesterol mg/dL 19   Cholesterol/HDL Ratio  2.6   Resulting Agency  [facility] - [facility]  Specimen Collected: 08/29/20 07:28 Last Resulted: 08/29/20 11:16  Received From: [facility]  Result Received: 09/08/20 16:36   Thyroid Stimulating Hormone (TSH) 0.450-5.330 uIU/ml uIU/mL 4.487   Resulting Agency  [place] - [facility]  Specimen Collected: 08/29/20 07:28 Last Resulted: 08/29/20 10:43  Received From: [place]  Result Received: 09/08/20 16:36   Color Yellow, Violet, Light Violet, Dark Violet Yellow   Clarity Clear, Other Clear   Specific Gravity 1.000 - 1.030 1.025   pH, Urine 5.0 - 8.0 6.0   Protein, Urinalysis Negative, Trace mg/dL Negative   Glucose, Urinalysis Negative mg/dL Negative   Ketones, Urinalysis Negative mg/dL Negative   Blood, Urinalysis Negative Negative   Nitrite, Urinalysis Negative Negative   Leukocyte Esterase, Urinalysis Negative Negative   White Blood Cells, Urinalysis None Seen, 0-3 /hpf 0-3   Red Blood Cells, Urinalysis None Seen, 0-3 /hpf 0-3   Bacteria, Urinalysis None Seen /hpf None Seen   Squamous Epithelial Cells, Urinalysis Rare, Few, None Seen /hpf Rare   Resulting Agency  [place] - LAB  Specimen Collected: 08/29/20 07:28 Last Resulted: 08/29/20 10:41  Received From: [place]  Result Received: 09/08/20 16:36  I have reviewed the labs.  Pertinent Imaging N/A   Assessment & Plan:    1. BPH with LUTS -PSA stable -DRE benign -UA benign -most bothersome symptoms are urgency and urge incontinence -As he was taking over-the-counter cold medications, I stated this was most likely the culprit of his urinary symptoms at the time but if they should return to contact us -continue  conservative management, avoiding bladder irritants and timed voiding's -Continue finasteride 5 mg daily  2. Urge incontinence -improving  -see #1    Return in about 1 year (around 03/06/2022) for IPSS, PSA and exam.  Zara Council, [facility] [place] [address], [place] [address] [phone]

## 2021-03-06 ENCOUNTER — Encounter: Payer: Self-pay | Admitting: Urology

## 2021-03-06 ENCOUNTER — Other Ambulatory Visit: Payer: Self-pay

## 2021-03-06 ENCOUNTER — Ambulatory Visit: Payer: PPO | Admitting: Urology

## 2021-03-06 VITALS — BP 135/80 | HR 73 | Ht 77.0 in | Wt 283.0 lb

## 2021-03-06 DIAGNOSIS — N3941 Urge incontinence: Secondary | ICD-10-CM | POA: Diagnosis not present

## 2021-03-06 DIAGNOSIS — R1013 Epigastric pain: Secondary | ICD-10-CM | POA: Diagnosis not present

## 2021-03-06 DIAGNOSIS — R946 Abnormal results of thyroid function studies: Secondary | ICD-10-CM | POA: Diagnosis not present

## 2021-03-06 DIAGNOSIS — K219 Gastro-esophageal reflux disease without esophagitis: Secondary | ICD-10-CM | POA: Diagnosis not present

## 2021-03-06 DIAGNOSIS — N401 Enlarged prostate with lower urinary tract symptoms: Secondary | ICD-10-CM | POA: Diagnosis not present

## 2021-03-06 DIAGNOSIS — I2510 Atherosclerotic heart disease of native coronary artery without angina pectoris: Secondary | ICD-10-CM | POA: Diagnosis not present

## 2021-03-06 DIAGNOSIS — R739 Hyperglycemia, unspecified: Secondary | ICD-10-CM | POA: Diagnosis not present

## 2021-03-06 DIAGNOSIS — I77810 Thoracic aortic ectasia: Secondary | ICD-10-CM | POA: Diagnosis not present

## 2021-03-06 DIAGNOSIS — N138 Other obstructive and reflux uropathy: Secondary | ICD-10-CM

## 2021-03-12 DIAGNOSIS — N400 Enlarged prostate without lower urinary tract symptoms: Secondary | ICD-10-CM | POA: Diagnosis not present

## 2021-03-12 DIAGNOSIS — Z0000 Encounter for general adult medical examination without abnormal findings: Secondary | ICD-10-CM | POA: Diagnosis not present

## 2021-03-12 DIAGNOSIS — I5032 Chronic diastolic (congestive) heart failure: Secondary | ICD-10-CM | POA: Diagnosis not present

## 2021-03-12 DIAGNOSIS — R739 Hyperglycemia, unspecified: Secondary | ICD-10-CM | POA: Diagnosis not present

## 2021-03-12 DIAGNOSIS — R1013 Epigastric pain: Secondary | ICD-10-CM | POA: Diagnosis not present

## 2021-03-12 DIAGNOSIS — I77810 Thoracic aortic ectasia: Secondary | ICD-10-CM | POA: Diagnosis not present

## 2021-03-12 DIAGNOSIS — K760 Fatty (change of) liver, not elsewhere classified: Secondary | ICD-10-CM | POA: Diagnosis not present

## 2021-03-12 DIAGNOSIS — D649 Anemia, unspecified: Secondary | ICD-10-CM | POA: Diagnosis not present

## 2021-03-12 DIAGNOSIS — E7849 Other hyperlipidemia: Secondary | ICD-10-CM | POA: Diagnosis not present

## 2021-03-12 DIAGNOSIS — R7989 Other specified abnormal findings of blood chemistry: Secondary | ICD-10-CM | POA: Diagnosis not present

## 2021-03-12 DIAGNOSIS — E349 Endocrine disorder, unspecified: Secondary | ICD-10-CM | POA: Diagnosis not present

## 2021-03-12 DIAGNOSIS — I2510 Atherosclerotic heart disease of native coronary artery without angina pectoris: Secondary | ICD-10-CM | POA: Diagnosis not present

## 2021-03-13 NOTE — Telephone Encounter (Signed)
Office resending request for clearance with updated surgical date of 08-06-21

## 2021-03-20 DIAGNOSIS — G4733 Obstructive sleep apnea (adult) (pediatric): Secondary | ICD-10-CM | POA: Diagnosis not present

## 2021-04-24 DIAGNOSIS — G4733 Obstructive sleep apnea (adult) (pediatric): Secondary | ICD-10-CM | POA: Diagnosis not present

## 2021-04-26 DIAGNOSIS — D225 Melanocytic nevi of trunk: Secondary | ICD-10-CM | POA: Diagnosis not present

## 2021-04-26 DIAGNOSIS — L821 Other seborrheic keratosis: Secondary | ICD-10-CM | POA: Diagnosis not present

## 2021-04-26 DIAGNOSIS — X32XXXA Exposure to sunlight, initial encounter: Secondary | ICD-10-CM | POA: Diagnosis not present

## 2021-04-26 DIAGNOSIS — D2271 Melanocytic nevi of right lower limb, including hip: Secondary | ICD-10-CM | POA: Diagnosis not present

## 2021-04-26 DIAGNOSIS — D2261 Melanocytic nevi of right upper limb, including shoulder: Secondary | ICD-10-CM | POA: Diagnosis not present

## 2021-04-26 DIAGNOSIS — D2272 Melanocytic nevi of left lower limb, including hip: Secondary | ICD-10-CM | POA: Diagnosis not present

## 2021-04-26 DIAGNOSIS — L570 Actinic keratosis: Secondary | ICD-10-CM | POA: Diagnosis not present

## 2021-04-26 DIAGNOSIS — D2262 Melanocytic nevi of left upper limb, including shoulder: Secondary | ICD-10-CM | POA: Diagnosis not present

## 2021-05-01 ENCOUNTER — Other Ambulatory Visit: Payer: Self-pay | Admitting: Urology

## 2021-05-01 DIAGNOSIS — N3941 Urge incontinence: Secondary | ICD-10-CM

## 2021-05-07 ENCOUNTER — Other Ambulatory Visit: Payer: Self-pay | Admitting: Surgical

## 2021-05-07 ENCOUNTER — Other Ambulatory Visit: Payer: Self-pay | Admitting: Cardiovascular Disease

## 2021-05-11 DIAGNOSIS — H35362 Drusen (degenerative) of macula, left eye: Secondary | ICD-10-CM | POA: Diagnosis not present

## 2021-05-16 NOTE — Telephone Encounter (Signed)
[facility] checking on status of clearance ?

## 2021-06-14 ENCOUNTER — Ambulatory Visit: Payer: PPO | Admitting: Cardiovascular Disease

## 2021-06-14 ENCOUNTER — Encounter: Payer: Self-pay | Admitting: Cardiovascular Disease

## 2021-06-14 VITALS — BP 90/60 | HR 67 | Ht 77.0 in | Wt 284.0 lb

## 2021-06-14 DIAGNOSIS — I493 Ventricular premature depolarization: Secondary | ICD-10-CM | POA: Diagnosis not present

## 2021-06-14 DIAGNOSIS — G4730 Sleep apnea, unspecified: Secondary | ICD-10-CM

## 2021-06-14 DIAGNOSIS — I5032 Chronic diastolic (congestive) heart failure: Secondary | ICD-10-CM

## 2021-06-14 DIAGNOSIS — E785 Hyperlipidemia, unspecified: Secondary | ICD-10-CM

## 2021-06-14 DIAGNOSIS — I2510 Atherosclerotic heart disease of native coronary artery without angina pectoris: Secondary | ICD-10-CM | POA: Diagnosis not present

## 2021-06-14 DIAGNOSIS — Z87891 Personal history of nicotine dependence: Secondary | ICD-10-CM | POA: Diagnosis not present

## 2021-06-14 DIAGNOSIS — I10 Essential (primary) hypertension: Secondary | ICD-10-CM

## 2021-06-14 MED ORDER — ROSUVASTATIN CALCIUM 20 MG PO TABS: 20.0000 mg | ORAL_TABLET | Freq: Every day | ORAL | 3 refills | Status: DC

## 2021-06-14 NOTE — Progress Notes (Signed)
?  ?Cardiology Office Note ? ? ?Date:  06/14/2021  ? ?ID:  Brian Banks, DOB October 09, 1945, MRN [id number] ? ?PCP:  Adin Hector, MD  ?Cardiologist:   [name], MD  ? ?Chief Complaint  ?Patient presents with  ? Other  ?  6 Month f/u no complaints today. Meds reviewed verbally with pt.  ? ? ?  ?History of Present Illness: ?Brian Banks is a 76 y.o. male who presents for a follow-up visit regarding coronary artery disease status post CABG in June, 2022. ?He has known history of hyperlipidemia, GERD, borderline diabetes, obesity and cigar use. ?He presented in June with unstable angina.  Cardiac catheterization showed left dominant coronary arteries with moderate left main disease, critical ostial LAD stenosis with tandem lesions in the proximal segment and significant ostial left PDA stenosis.  The LAD was moderately to severely calcified with extension of the plaque into the distal left main making stenting of the LAD not possible without jailing the dominant left circumflex.  Ejection fraction was normal with mildly elevated left ventricular end-diastolic pressure.  Based on this, I recommended CABG.  The patient underwent CABG at [facility] with LIMA to LAD, SVG to left PDA and SVG to OM 2.  The patient had blood loss anemia that required transfusion and also had PVCs that were treated with amiodarone which was subsequently discontinued. ? ?Echocardiogram in October 2022 showed an EF of 45 to 50%. ? ?He has been doing well and reports stable occasional episodes of chest tightness with overexertion but not very limiting.  In addition, he had an episode of palpitations that lasted only 2 minutes recently.  No further episodes. ? ? ? ? ?Past Medical History:  ?Diagnosis Date  ? Benign neoplasm of large bowel   ? BPH with obstruction/lower urinary tract symptoms   ? Dysphagia   ? Erectile dysfunction   ? Heartburn   ? History of kidney stones   ? History of stomach ulcers   ? HLD (hyperlipidemia)   ?  Hypogonadism in male   ? Obesity   ? Urinary frequency   ? Urinary urgency   ? Vitamin D deficiency   ? ? ?Past Surgical History:  ?Procedure Laterality Date  ? APPENDECTOMY    ? CORONARY ARTERY BYPASS GRAFT N/A 07/19/2020  ? Procedure: CORONARY ARTERY BYPASS GRAFTING (CABG) TIMES THREE ON PUMP USING LEFT INTERNAL MAMMARY ARTERY AND ENDOSCOPICALLY HARVESTED RIGHT GREATER SAPHENOUS VEIN;  Surgeon: Wonda Olds, MD;  Location: [place];  Service: Open Heart Surgery;  Laterality: N/A;  ? ENDOVEIN HARVEST OF GREATER SAPHENOUS VEIN Right 07/19/2020  ? Procedure: ENDOVEIN HARVEST OF GREATER SAPHENOUS VEIN;  Surgeon: Wonda Olds, MD;  Location: [place];  Service: Open Heart Surgery;  Laterality: Right;  ? LEFT HEART CATH AND CORONARY ANGIOGRAPHY N/A 07/18/2020  ? Procedure: LEFT HEART CATH AND CORONARY ANGIOGRAPHY;  Surgeon: Wellington Hampshire, MD;  Location: [place] [facility];  Service: Cardiovascular;  Laterality: N/A;  ? TEE WITHOUT CARDIOVERSION N/A 07/19/2020  ? Procedure: TRANSESOPHAGEAL ECHOCARDIOGRAM (TEE);  Surgeon: Wonda Olds, MD;  Location: [place];  Service: Open Heart Surgery;  Laterality: N/A;  ? ? ? ?Current Outpatient Medications  ?Medication Sig Dispense Refill  ? aspirin EC 81 MG EC tablet Take 1 tablet (81 mg total) by mouth daily. Swallow whole.    ? clopidogrel (PLAVIX) 75 MG tablet Take 1 tablet (75 mg total) by mouth daily. 90 tablet 3  ? finasteride (PROSCAR)  5 MG tablet TAKE 1 TABLET BY MOUTH DAILY 90 tablet 3  ? metoprolol succinate (TOPROL XL) 25 MG 24 hr tablet Take 1 tablet (25 mg total) by mouth daily. 90 tablet 3  ? pantoprazole (PROTONIX) 40 MG tablet Take 1 tablet (40 mg total) by mouth daily. 90 tablet 1  ? rosuvastatin (CRESTOR) 20 MG tablet TAKE ONE TABLET BY MOUTH EVERY DAY 30 tablet 3  ? ?No current facility-administered medications for this visit.  ? ? ?Allergies:   Lovastatin, Quinolones, and Androgel [testosterone]  ? ? ?Social History:  The patient  reports that he has been  smoking cigars. He has never used smokeless tobacco. He reports that he does not currently use alcohol. He reports that he does not use drugs.  ? ?Family History:  The patient's family history includes Alzheimer's disease in his father; Diabetes Mellitus II in his mother; Prostate cancer in his brother; Stroke in his mother.  ? ? ?ROS:  Please see the history of present illness.   Otherwise, review of systems are positive for none.   All other systems are reviewed and negative.  ? ? ?PHYSICAL EXAM: ?VS:  BP 90/60 (BP Location: Left Arm, Patient Position: Sitting, Cuff Size: Large)   Pulse 67   Ht '6\' 5"'$  (1.956 m)   Wt 284 lb (128.8 kg)   SpO2 97%   BMI 33.68 kg/m?  , BMI Body mass index is 33.68 kg/m?. ?GEN: Well nourished, well developed, in no acute distress  ?HEENT: normal  ?Neck: no JVD, carotid bruits, or masses ?Cardiac: RRR; no murmurs, rubs, or gallops, trace edema  ?Respiratory:  clear to auscultation bilaterally, normal work of breathing ?GI: soft, nontender, nondistended, + BS ?MS: no deformity or atrophy  ?Skin: warm and dry, no rash ?Neuro:  Strength and sensation are intact ?Psych: euthymic mood, full affect ? ? ?EKG:  EKG is ordered today. ?The ekg ordered today demonstrates sinus rhythm with first-degree AV block and right bundle branch block.  1 PVC. ? ? ?Recent Labs: ?07/18/2020: ALT 20 ?07/20/2020: Magnesium 2.1 ?07/24/2020: BUN 25; Creatinine, Ser 0.95; Potassium 3.9; Sodium 136 ?09/25/2020: Hemoglobin 12.8; Platelets 235  ? ? ?Lipid Panel ?   ?Component Value Date/Time  ? CHOL 179 07/18/2020 1019  ? TRIG 95 07/18/2020 1019  ? HDL 43 07/18/2020 1019  ? CHOLHDL 4.2 07/18/2020 1019  ? VLDL 19 07/18/2020 1019  ? LDLCALC 117 (H) 07/18/2020 1019  ? ?  ? ?Wt Readings from Last 3 Encounters:  ?06/14/21 284 lb (128.8 kg)  ?03/06/21 283 lb (128.4 kg)  ?02/20/21 280 lb (127 kg)  ?  ? ? ? ?   ? View : No data to display.  ?  ?  ?  ? ? ? ? ?ASSESSMENT AND PLAN: ? ?1.  Coronary artery disease involving native  coronary artery status post CABG: He is overall doing very well with no anginal symptoms.    I asked him to continue clopidogrel after 1 month. ? ?2.  Chronic systolic heart failure: EF of 45 to 50%.  No evidence of volume overload.  Continue small dose Toprol.  Blood pressure is low and does not allow the addition of ACE inhibitors or ARB's. ? ?3.  Frequent PVCs: No significant burden on small dose Toprol. ? ?4.  Hyperlipidemia: Most recent lipid profile showed an LDL of 50.  Rosuvastatin was refilled. ? ?5.  Essential hypertension: Blood pressures controlled. ? ?6.  History of cigar use: He usually smokes 1 cigar  a day  ? ?7.  Sleep apnea: On CPAP. ? ? ?Disposition:   FU with me in 6 months ? ?Signed, ? ?[name], MD  ?06/14/2021 8:45 AM    ?[place] ? ?

## 2021-06-14 NOTE — Patient Instructions (Signed)
Medication Instructions:  ?Your physician has recommended you make the following change in your medication:  ? ?STOP Plavix (clopidogrel) in 1 month. ? ?Crestor has bee refilled today ? ?*If you need a refill on your cardiac medications before your next appointment, please call your pharmacy* ? ? ?Lab Work: ?None ordered ?If you have labs (blood work) drawn today and your tests are completely normal, you will receive your results only by: ?MyChart Message (if you have MyChart) OR ?A paper copy in the mail ?If you have any lab test that is abnormal or we need to change your treatment, we will call you to review the results. ? ? ?Testing/Procedures: ?None ordered ? ? ?Follow-Up: ?At [facility], you and your health needs are our priority.  As part of our continuing mission to provide you with exceptional heart care, we have created designated Provider Care Teams.  These Care Teams include your primary Cardiologist (physician) and Advanced Practice Providers (APPs -  Physician Assistants and Nurse Practitioners) who all work together to provide you with the care you need, when you need it. ? ?We recommend signing up for the patient portal called "MyChart".  Sign up information is provided on this After Visit Summary.  MyChart is used to connect with patients for Virtual Visits (Telemedicine).  Patients are able to view lab/test results, encounter notes, upcoming appointments, etc.  Non-urgent messages can be sent to your provider as well.   ?To learn more about what you can do with MyChart, go to [email or web address].   ? ?Your next appointment:   ?6 month(s) ? ?The format for your next appointment:   ?In Person ? ?Provider:   ?You may see [name], MD or one of the following Advanced Practice Providers on your designated Care Team:   ?Murray Hodgkins, NP ?Christell Faith, PA-C ?Cadence Kathlen Mody, PA-C{ ? ? ? ?Other Instructions ?N/A ? ?Important Information About Sugar ? ? ? ? ? ? ?

## 2021-06-27 NOTE — Telephone Encounter (Signed)
[place] sending fax to check on status of clearance ?

## 2021-06-27 NOTE — Telephone Encounter (Signed)
? ?  Name: Brian Banks  ?DOB: Jun 24, 1945  ?MRN: [id number]  ? ?Primary Cardiologist: [name], MD ? ?Chart reviewed as part of pre-operative protocol coverage. Patient was contacted 06/27/2021 in reference to pre-operative risk assessment for pending surgery as outlined below.  Brian Banks was last seen on 06/14/2021 by Dr. Fletcher Anon.  Since that day, Brian Banks has done done well from a cardiac standpoint. He denies any new or concerning symptoms and is able to complete >4 METS.  ? ?Therefore, based on ACC/AHA guidelines, the patient would be at acceptable risk for the planned procedure without further cardiovascular testing.  ? ?The patient was advised that if he develops new symptoms prior to surgery to contact our office to arrange for a follow-up visit, and he verbalized understanding. ? ?Patient will no longer be taking Plavix as of 07/15/2021. Colonoscopy is scheduled for 08/06/2021. Therefore, recommendations for holding antiplatelet therapy are not required for this procedure.  ? ?I will route this recommendation to the requesting party via Epic fax function and remove from pre-op pool. Please call with questions. ? ?Lenna Sciara, NP ?06/27/2021, 11:21 AM ? ?

## 2021-07-02 DIAGNOSIS — K219 Gastro-esophageal reflux disease without esophagitis: Secondary | ICD-10-CM | POA: Diagnosis not present

## 2021-07-02 DIAGNOSIS — Z86010 Personal history of colonic polyps: Secondary | ICD-10-CM | POA: Diagnosis not present

## 2021-08-06 ENCOUNTER — Ambulatory Visit: Admission: RE | Admit: 2021-08-06 | Payer: PPO | Source: Ambulatory Visit | Admitting: Gastroenterology

## 2021-08-06 ENCOUNTER — Encounter: Admission: RE | Payer: Self-pay | Source: Ambulatory Visit

## 2021-08-06 SURGERY — COLONOSCOPY WITH PROPOFOL
Anesthesia: General

## 2021-09-03 DIAGNOSIS — D649 Anemia, unspecified: Secondary | ICD-10-CM | POA: Diagnosis not present

## 2021-09-03 DIAGNOSIS — I2510 Atherosclerotic heart disease of native coronary artery without angina pectoris: Secondary | ICD-10-CM | POA: Diagnosis not present

## 2021-09-03 DIAGNOSIS — R739 Hyperglycemia, unspecified: Secondary | ICD-10-CM | POA: Diagnosis not present

## 2021-09-03 DIAGNOSIS — E7849 Other hyperlipidemia: Secondary | ICD-10-CM | POA: Diagnosis not present

## 2021-09-03 DIAGNOSIS — R1013 Epigastric pain: Secondary | ICD-10-CM | POA: Diagnosis not present

## 2021-09-10 DIAGNOSIS — E349 Endocrine disorder, unspecified: Secondary | ICD-10-CM | POA: Diagnosis not present

## 2021-09-10 DIAGNOSIS — K760 Fatty (change of) liver, not elsewhere classified: Secondary | ICD-10-CM | POA: Diagnosis not present

## 2021-09-10 DIAGNOSIS — R7303 Prediabetes: Secondary | ICD-10-CM | POA: Diagnosis not present

## 2021-09-10 DIAGNOSIS — R1013 Epigastric pain: Secondary | ICD-10-CM | POA: Diagnosis not present

## 2021-09-10 DIAGNOSIS — I2510 Atherosclerotic heart disease of native coronary artery without angina pectoris: Secondary | ICD-10-CM | POA: Diagnosis not present

## 2021-09-10 DIAGNOSIS — E7849 Other hyperlipidemia: Secondary | ICD-10-CM | POA: Diagnosis not present

## 2021-09-10 DIAGNOSIS — R7989 Other specified abnormal findings of blood chemistry: Secondary | ICD-10-CM | POA: Diagnosis not present

## 2021-09-10 DIAGNOSIS — I77810 Thoracic aortic ectasia: Secondary | ICD-10-CM | POA: Diagnosis not present

## 2021-09-10 DIAGNOSIS — K219 Gastro-esophageal reflux disease without esophagitis: Secondary | ICD-10-CM | POA: Diagnosis not present

## 2021-11-02 ENCOUNTER — Encounter: Payer: Self-pay | Admitting: Gastroenterology

## 2021-11-05 ENCOUNTER — Encounter: Payer: Self-pay | Admitting: Gastroenterology

## 2021-11-05 ENCOUNTER — Ambulatory Visit: Payer: PPO | Admitting: Anesthesiology

## 2021-11-05 ENCOUNTER — Encounter
Admission: RE | Disposition: A | Discharge status: Home / Self Care | Payer: Self-pay | Source: Home / Self Care | Attending: Gastroenterology

## 2021-11-05 ENCOUNTER — Ambulatory Visit
Admission: RE | Admit: 2021-11-05 | Discharge: 2021-11-05 | Disposition: A | Discharge status: Home / Self Care | Payer: PPO | Attending: Gastroenterology | Admitting: Gastroenterology

## 2021-11-05 DIAGNOSIS — D123 Benign neoplasm of transverse colon: Secondary | ICD-10-CM | POA: Diagnosis not present

## 2021-11-05 DIAGNOSIS — R7303 Prediabetes: Secondary | ICD-10-CM | POA: Diagnosis not present

## 2021-11-05 DIAGNOSIS — D126 Benign neoplasm of colon, unspecified: Secondary | ICD-10-CM | POA: Diagnosis not present

## 2021-11-05 DIAGNOSIS — K219 Gastro-esophageal reflux disease without esophagitis: Secondary | ICD-10-CM | POA: Diagnosis not present

## 2021-11-05 DIAGNOSIS — K5730 Diverticulosis of large intestine without perforation or abscess without bleeding: Secondary | ICD-10-CM | POA: Insufficient documentation

## 2021-11-05 DIAGNOSIS — Z800 Family history of malignant neoplasm of digestive organs: Secondary | ICD-10-CM | POA: Diagnosis not present

## 2021-11-05 DIAGNOSIS — D128 Benign neoplasm of rectum: Secondary | ICD-10-CM | POA: Insufficient documentation

## 2021-11-05 DIAGNOSIS — D122 Benign neoplasm of ascending colon: Secondary | ICD-10-CM | POA: Insufficient documentation

## 2021-11-05 DIAGNOSIS — D125 Benign neoplasm of sigmoid colon: Secondary | ICD-10-CM | POA: Diagnosis not present

## 2021-11-05 DIAGNOSIS — E669 Obesity, unspecified: Secondary | ICD-10-CM | POA: Insufficient documentation

## 2021-11-05 DIAGNOSIS — Z86010 Personal history of colonic polyps: Secondary | ICD-10-CM | POA: Insufficient documentation

## 2021-11-05 DIAGNOSIS — E119 Type 2 diabetes mellitus without complications: Secondary | ICD-10-CM | POA: Diagnosis not present

## 2021-11-05 DIAGNOSIS — F17200 Nicotine dependence, unspecified, uncomplicated: Secondary | ICD-10-CM | POA: Insufficient documentation

## 2021-11-05 DIAGNOSIS — K640 First degree hemorrhoids: Secondary | ICD-10-CM | POA: Insufficient documentation

## 2021-11-05 DIAGNOSIS — K648 Other hemorrhoids: Secondary | ICD-10-CM | POA: Diagnosis not present

## 2021-11-05 DIAGNOSIS — I2510 Atherosclerotic heart disease of native coronary artery without angina pectoris: Secondary | ICD-10-CM | POA: Diagnosis not present

## 2021-11-05 DIAGNOSIS — Z1211 Encounter for screening for malignant neoplasm of colon: Secondary | ICD-10-CM | POA: Diagnosis not present

## 2021-11-05 DIAGNOSIS — Z6833 Body mass index (BMI) 33.0-33.9, adult: Secondary | ICD-10-CM | POA: Insufficient documentation

## 2021-11-05 DIAGNOSIS — K635 Polyp of colon: Secondary | ICD-10-CM | POA: Diagnosis not present

## 2021-11-05 DIAGNOSIS — D124 Benign neoplasm of descending colon: Secondary | ICD-10-CM | POA: Diagnosis not present

## 2021-11-05 DIAGNOSIS — Z951 Presence of aortocoronary bypass graft: Secondary | ICD-10-CM | POA: Insufficient documentation

## 2021-11-05 HISTORY — PX: COLONOSCOPY: SHX5424

## 2021-11-05 SURGERY — COLONOSCOPY
Anesthesia: General

## 2021-11-05 MED ORDER — PROPOFOL 10 MG/ML IV BOLUS
INTRAVENOUS | Status: DC | PRN
  Administered 2021-11-05: 60 mg via INTRAVENOUS

## 2021-11-05 MED ORDER — LIDOCAINE HCL (CARDIAC) PF 100 MG/5ML IV SOSY
PREFILLED_SYRINGE | INTRAVENOUS | Status: DC | PRN
  Administered 2021-11-05: 50 mg via INTRAVENOUS

## 2021-11-05 MED ORDER — PROPOFOL 500 MG/50ML IV EMUL
INTRAVENOUS | Status: DC | PRN
  Administered 2021-11-05: 150 ug/kg/min via INTRAVENOUS

## 2021-11-05 MED ORDER — SODIUM CHLORIDE 0.9 % IV SOLN
INTRAVENOUS | Status: DC
  Administered 2021-11-05: 1000 mL via INTRAVENOUS

## 2021-11-05 NOTE — H&P (Signed)
Pre-Procedure H&P   Patient ID: Brian Banks is a 76 y.o. male.  Gastroenterology Provider: Annamaria Helling, DO  Referring Provider: Laurine Blazer, PA PCP: Adin Hector, MD  Date: 11/05/2021  HPI [name] is a 76 y.o. male who presents today for Colonoscopy for Surveillance; personal history of colon polyps, family history colon cancer.  Brother with history of colon cancer at ~76 y/o Patient with a history of colon polyps. Colonoscopy in December 2017 reportedly normal Colonoscopy in 2014 with 4 tubular adenomas and internal hemorrhoids  Patient no longer taking Plavix.  He has a bowel movement every 2 to 3 days without melena or hematochezia.  Denies any diarrheal symptoms or abnormal weight loss  Hemoglobin 13.7 MCV 89.6 platelets 224,000 creatinine 0.9   Past Medical History:  Diagnosis Date   Benign neoplasm of large bowel    BPH with obstruction/lower urinary tract symptoms    Dysphagia    Erectile dysfunction    Heartburn    History of stomach ulcers    HLD (hyperlipidemia)    Hypogonadism in male    Obesity    Urinary frequency    Urinary urgency    Vitamin D deficiency     Past Surgical History:  Procedure Laterality Date   APPENDECTOMY     CARDIAC CATHETERIZATION     CORONARY ARTERY BYPASS GRAFT N/A 07/19/2020   Procedure: CORONARY ARTERY BYPASS GRAFTING (CABG) TIMES THREE ON PUMP USING LEFT INTERNAL MAMMARY ARTERY AND ENDOSCOPICALLY HARVESTED RIGHT GREATER SAPHENOUS VEIN;  Surgeon: Wonda Olds, MD;  Location: [place];  Service: Open Heart Surgery;  Laterality: N/A;   ENDOVEIN HARVEST OF GREATER SAPHENOUS VEIN Right 07/19/2020   Procedure: ENDOVEIN HARVEST OF GREATER SAPHENOUS VEIN;  Surgeon: Wonda Olds, MD;  Location: [place];  Service: Open Heart Surgery;  Laterality: Right;   LEFT HEART CATH AND CORONARY ANGIOGRAPHY N/A 07/18/2020   Procedure: LEFT HEART CATH AND CORONARY ANGIOGRAPHY;  Surgeon: Wellington Hampshire, MD;   Location: [place] [facility];  Service: Cardiovascular;  Laterality: N/A;   TEE WITHOUT CARDIOVERSION N/A 07/19/2020   Procedure: TRANSESOPHAGEAL ECHOCARDIOGRAM (TEE);  Surgeon: Wonda Olds, MD;  Location: [place];  Service: Open Heart Surgery;  Laterality: N/A;    Family History Brother- crc No h/o GI disease or malignancy  Review of Systems  Constitutional:  Negative for activity change, appetite change, chills, diaphoresis, fatigue, fever and unexpected weight change.  HENT:  Negative for trouble swallowing and voice change.   Respiratory:  Negative for shortness of breath and wheezing.   Cardiovascular:  Negative for chest pain, palpitations and leg swelling.  Gastrointestinal:  Negative for abdominal distention, abdominal pain, anal bleeding, blood in stool, constipation, diarrhea, nausea and vomiting.  Musculoskeletal:  Negative for arthralgias and myalgias.  Skin:  Negative for color change and pallor.  Neurological:  Negative for dizziness, syncope and weakness.  Psychiatric/Behavioral:  Negative for confusion. The patient is not nervous/anxious.   All other systems reviewed and are negative.    Medications No current facility-administered medications on file prior to encounter.   Current Outpatient Medications on File Prior to Encounter  Medication Sig Dispense Refill   aspirin EC 81 MG EC tablet Take 1 tablet (81 mg total) by mouth daily. Swallow whole.     finasteride (PROSCAR) 5 MG tablet TAKE 1 TABLET BY MOUTH DAILY 90 tablet 3   metoprolol succinate (TOPROL XL) 25 MG 24 hr tablet Take 1 tablet (  25 mg total) by mouth daily. 90 tablet 3   pantoprazole (PROTONIX) 40 MG tablet Take 1 tablet (40 mg total) by mouth daily. 90 tablet 1   clopidogrel (PLAVIX) 75 MG tablet Take 1 tablet (75 mg total) by mouth daily. (Patient not taking: Reported on 11/05/2021) 90 tablet 3    Pertinent medications related to GI and procedure were reviewed by me with the patient prior to the  procedure   Current Facility-Administered Medications:    0.9 %  sodium chloride infusion, , Intravenous, Continuous, Annamaria Helling, DO, Last Rate: 20 mL/hr at 11/05/21 0855, 1,000 mL at 11/05/21 0855      Allergies  Allergen Reactions   Lovastatin     Reports statin intolerance/myalgias.  Tolerates Crestor 20 mg daily.   Quinolones     Contraindicated given aortic dilation   Androgel [Testosterone] Rash   Allergies were reviewed by me prior to the procedure  Objective   Body mass index is 33.27 kg/m. Vitals:   11/05/21 0840  BP: 117/79  Pulse: 74  Resp: 18  Temp: (!) 97.3 F (36.3 C)  TempSrc: Temporal  SpO2: 97%  Weight: 127.3 kg  Height: '6\' 5"'$  (1.956 m)     Physical Exam Vitals and nursing note reviewed.  Constitutional:      General: He is not in acute distress.    Appearance: Normal appearance. He is obese. He is not ill-appearing, toxic-appearing or diaphoretic.  HENT:     Head: Normocephalic and atraumatic.     Nose: Nose normal.     Mouth/Throat:     Mouth: Mucous membranes are moist.     Pharynx: Oropharynx is clear.  Eyes:     General: No scleral icterus.    Extraocular Movements: Extraocular movements intact.  Cardiovascular:     Rate and Rhythm: Normal rate and regular rhythm.     Heart sounds: Normal heart sounds. No murmur heard.    No friction rub. No gallop.  Pulmonary:     Effort: Pulmonary effort is normal. No respiratory distress.     Breath sounds: Normal breath sounds. No wheezing, rhonchi or rales.  Abdominal:     General: Bowel sounds are normal. There is no distension.     Palpations: Abdomen is soft.     Tenderness: There is no abdominal tenderness. There is no guarding or rebound.  Musculoskeletal:     Cervical back: Neck supple.     Right lower leg: No edema.     Left lower leg: No edema.  Skin:    General: Skin is warm and dry.     Coloration: Skin is not jaundiced or pale.  Neurological:     General: No focal  deficit present.     Mental Status: He is alert and oriented to person, place, and time. Mental status is at baseline.  Psychiatric:        Mood and Affect: Mood normal.        Behavior: Behavior normal.        Thought Content: Thought content normal.        Judgment: Judgment normal.      Assessment:  [name] is a 76 y.o. male  who presents today for Colonoscopy for Surveillance; personal history of colon polyps, family history colon cancer.  Plan:  Colonoscopy with possible intervention today  Colonoscopy with possible biopsy, control of bleeding, polypectomy, and interventions as necessary has been discussed with the patient/patient representative. Informed consent was obtained  from the patient/patient representative after explaining the indication, nature, and risks of the procedure including but not limited to death, bleeding, perforation, missed neoplasm/lesions, cardiorespiratory compromise, and reaction to medications. Opportunity for questions was given and appropriate answers were provided. Patient/patient representative has verbalized understanding is amenable to undergoing the procedure.   Annamaria Helling, DO  [facility] Gastroenter[facility]  Portions of the record may have been created with voice recognition software. Occasional wrong-word or 'sound-a-like' substitutions may have occurred due to the inherent limitations of voice recognition software.  Read the chart carefully and recognize, using context, where substitutions may have occurred.

## 2021-11-05 NOTE — Anesthesia Preprocedure Evaluation (Signed)
Anesthesia Evaluation  Patient identified by MRN, date of birth, ID band Patient awake    Reviewed: Allergy & Precautions, H&P , NPO status , Patient's Chart, lab work & pertinent test results, reviewed documented beta blocker date and time   History of Anesthesia Complications Negative for: history of anesthetic complications  Airway Mallampati: I  TM Distance: >3 FB Neck ROM: full    Dental  (+) Dental Advidsory Given, Caps, Teeth Intact   Pulmonary Current Smoker,    Pulmonary exam normal breath sounds clear to auscultation       Cardiovascular Exercise Tolerance: Good (-) hypertension(-) angina+ CAD, + Past MI and + CABG  (-) Cardiac Stents Normal cardiovascular exam+ dysrhythmias (-) Valvular Problems/Murmurs Rhythm:regular Rate:Normal     Neuro/Psych negative neurological ROS  negative psych ROS   GI/Hepatic Neg liver ROS, GERD  ,  Endo/Other  diabetes (borderline)  Renal/GU negative Renal ROS  negative genitourinary   Musculoskeletal   Abdominal   Peds  Hematology negative hematology ROS (+)   Anesthesia Other Findings Past Medical History: No date: Benign neoplasm of large bowel No date: BPH with obstruction/lower urinary tract symptoms No date: Dysphagia No date: Erectile dysfunction No date: Heartburn No date: History of stomach ulcers No date: HLD (hyperlipidemia) No date: Hypogonadism in male No date: Obesity No date: Urinary frequency No date: Urinary urgency No date: Vitamin D deficiency   Reproductive/Obstetrics negative OB ROS                             Anesthesia Physical Anesthesia Plan  ASA: 3  Anesthesia Plan: General   Post-op Pain Management:    Induction: Intravenous  PONV Risk Score and Plan: 1 and Propofol infusion and TIVA  Airway Management Planned: Natural Airway and Nasal Cannula  Additional Equipment:   Intra-op Plan:    Post-operative Plan:   Informed Consent: I have reviewed the patients History and Physical, chart, labs and discussed the procedure including the risks, benefits and alternatives for the proposed anesthesia with the patient or authorized representative who has indicated his/her understanding and acceptance.     Dental Advisory Given  Plan Discussed with: Anesthesiologist, CRNA and Surgeon  Anesthesia Plan Comments:         Anesthesia Quick Evaluation

## 2021-11-05 NOTE — Interval H&P Note (Signed)
History and Physical Interval Note: Preprocedure H&P from 11/05/21  was reviewed and there was no interval change after seeing and examining the patient.  Written consent was obtained from the patient after discussion of risks, benefits, and alternatives. Patient has consented to proceed with Colonoscopy with possible intervention   11/05/2021 9:24 AM  Brian Banks  has presented today for surgery, with the diagnosis of Family history of colon cancer (Z80.0).  The various methods of treatment have been discussed with the patient and family. After consideration of risks, benefits and other options for treatment, the patient has consented to  Procedure(s): COLONOSCOPY (N/A) as a surgical intervention.  The patient's history has been reviewed, patient examined, no change in status, stable for surgery.  I have reviewed the patient's chart and labs.  Questions were answered to the patient's satisfaction.     Annamaria Helling

## 2021-11-05 NOTE — Transfer of Care (Signed)
Immediate Anesthesia Transfer of Care Note  Patient: Brian Banks  Procedure(s) Performed: COLONOSCOPY  Patient Location: PACU and Endoscopy Unit  Anesthesia Type:General  Level of Consciousness: awake, drowsy and patient cooperative  Airway & Oxygen Therapy: Patient Spontanous Breathing  Post-op Assessment: Report given to RN and Post -op Vital signs reviewed and stable  Post vital signs: Reviewed and stable  Last Vitals:  Vitals Value Taken Time  BP 118/68 11/05/21 1118  Temp    Pulse 66 11/05/21 1119  Resp 17 11/05/21 1119  SpO2 100 % 11/05/21 1119  Vitals shown include unvalidated device data.  Last Pain:  Vitals:   11/05/21 0840  TempSrc: Temporal  PainSc: 0-No pain         Complications: No notable events documented.

## 2021-11-05 NOTE — Op Note (Signed)
[facility] Gastroenterology Patient Name: Brian Banks Procedure Date: 11/05/2021 10:13 AM MRN: [id number] Account #: [id number] Date of Birth: 03/03/1945 Admit Type: Outpatient Age: 76 Room: [facility] ENDO ROOM 2 Gender: Male Note Status: Finalized Instrument Name: Jasper Riling [id number] Procedure:             Colonoscopy Indications:           High risk colon cancer surveillance: Personal history                         of colonic polyps, Family history of colon cancer in a                         first-degree relative before age 35 years Providers:             Annamaria Helling DO, DO Medicines:             Monitored Anesthesia Care Complications:         No immediate complications. Estimated blood loss:                         Minimal. Procedure:             Pre-Anesthesia Assessment:                        - Prior to the procedure, a History and Physical was                         performed, and patient medications and allergies were                         reviewed. The patient is competent. The risks and                         benefits of the procedure and the sedation options and                         risks were discussed with the patient. All questions                         were answered and informed consent was obtained.                         Patient identification and proposed procedure were                         verified by the physician, the nurse, the anesthetist                         and the technician in the endoscopy suite. Mental                         Status Examination: alert and oriented. Airway                         Examination: normal oropharyngeal airway and neck                         mobility. Respiratory  Examination: clear to                         auscultation. CV Examination: RRR, no murmurs, no S3                         or S4. Prophylactic Antibiotics: The patient does not                         require prophylactic  antibiotics. Prior                         Anticoagulants: The patient has taken no previous                         anticoagulant or antiplatelet agents. ASA Grade                         Assessment: III - A patient with severe systemic                         disease. After reviewing the risks and benefits, the                         patient was deemed in satisfactory condition to                         undergo the procedure. The anesthesia plan was to use                         monitored anesthesia care (MAC). Immediately prior to                         administration of medications, the patient was                         re-assessed for adequacy to receive sedatives. The                         heart rate, respiratory rate, oxygen saturations,                         blood pressure, adequacy of pulmonary ventilation, and                         response to care were monitored throughout the                         procedure. The physical status of the patient was                         re-assessed after the procedure.                        After obtaining informed consent, the colonoscope was                         passed under direct vision. Throughout the procedure,  the patient's blood pressure, pulse, and oxygen                         saturations were monitored continuously. The                         Colonoscope was introduced through the anus and                         advanced to the the terminal ileum, with                         identification of the appendiceal orifice and IC                         valve. The colonoscopy was performed without                         difficulty. The patient tolerated the procedure well.                         The quality of the bowel preparation was evaluated                         using the BB[facility] Bowel Preparation Scale) with                         scores of: Right Colon = 3, Transverse Colon = 3  and                         Left Colon = 3 (entire mucosa seen well with no                         residual staining, small fragments of stool or opaque                         liquid). The total BBPS score equals 9. The terminal                         ileum, ileocecal valve, appendiceal orifice, and                         rectum were photographed. Findings:      The perianal and digital rectal examinations were normal. Pertinent       negatives include normal sphincter tone.      The terminal ileum appeared normal. Estimated blood loss: none.      Non-bleeding internal hemorrhoids were found during retroflexion. The       hemorrhoids were Grade I (internal hemorrhoids that do not prolapse).       Estimated blood loss: none.      Multiple small-mouthed diverticula were found in the sigmoid colon.       Estimated blood loss: none.      Eight sessile polyps were found in the rectum, sigmoid colon, transverse       colon and ascending colon. The polyps were 3 to 5 mm in size. These       polyps were removed with a cold snare. Resection and  retrieval were       complete. Estimated blood loss was minimal.      Nine sessile polyps were found in the rectum, sigmoid colon, descending       colon and transverse colon. The polyps were 1 to 2 mm in size. These       polyps were removed with a jumbo cold forceps. Resection and retrieval       were complete. Estimated blood loss was minimal.      The exam was otherwise without abnormality on direct and retroflexion       views. Impression:            - The examined portion of the ileum was normal.                        - Non-bleeding internal hemorrhoids.                        - Diverticulosis in the sigmoid colon.                        - Eight 3 to 5 mm polyps in the rectum, in the sigmoid                         colon, in the transverse colon and in the ascending                         colon, removed with a cold snare. Resected and                          retrieved.                        - Nine 1 to 2 mm polyps in the rectum, in the sigmoid                         colon, in the descending colon and in the transverse                         colon, removed with a jumbo cold forceps. Resected and                         retrieved.                        - The examination was otherwise normal on direct and                         retroflexion views. Recommendation:        - Patient has a contact number available for                         emergencies. The signs and symptoms of potential                         delayed complications were discussed with the patient.                         Return to normal activities tomorrow. Written  discharge instructions were provided to the patient.                        - Discharge patient to home.                        - Resume previous diet.                        - Continue present medications.                        - No aspirin, ibuprofen, naproxen, or other                         non-steroidal anti-inflammatory drugs for 5 days after                         polyp removal.                        - Await pathology results.                        - Repeat colonoscopy in 1 year for surveillance.                        - Return to referring physician as previously                         scheduled.                        - Consider genetics referral given personal and family                         history.                        - The findings and recommendations were discussed with                         the patient. Procedure Code(s):     --- Professional ---                        [id number], Colonoscopy, flexible; with removal of                         tumor(s), polyp(s), or other lesion(s) by snare                         technique                        45380, 8, Colonoscopy, flexible; with biopsy, single                         or multiple Diagnosis  Code(s):     --- Professional ---                        Z86.010, Personal history of colonic polyps  K64.0, First degree hemorrhoids                        K62.1, Rectal polyp                        K63.5, Polyp of colon                        Z80.0, Family history of malignant neoplasm of                         digestive organs                        K57.30, Diverticulosis of large intestine without                         perforation or abscess without bleeding CPT copyright 2019 American Medical Association. All rights reserved. The codes documented in this report are preliminary and upon coder review may  be revised to meet current compliance requirements. Attending Participation:      I personally performed the entire procedure. Volney American, DO Annamaria Helling DO, DO 11/05/2021 11:21:36 AM This report has been signed electronically. Number of Addenda: 0 Note Initiated On: 11/05/2021 10:13 AM Scope Withdrawal Time: 0 hours 40 minutes 36 seconds  Total Procedure Duration: 0 hours 53 minutes 30 seconds  Estimated Blood Loss:  Estimated blood loss was minimal.      [facility]

## 2021-11-05 NOTE — Anesthesia Procedure Notes (Signed)
Procedure Name: MAC Date/Time: 11/05/2021 10:15 AM  Performed by: Jerrye Noble, CRNAPre-anesthesia Checklist: Patient identified, Emergency Drugs available, Suction available and Patient being monitored Patient Re-evaluated:Patient Re-evaluated prior to induction Oxygen Delivery Method: Nasal cannula

## 2021-11-06 ENCOUNTER — Encounter: Payer: Self-pay | Admitting: Gastroenterology

## 2021-11-06 LAB — SURGICAL PATHOLOGY

## 2021-11-08 NOTE — Anesthesia Postprocedure Evaluation (Signed)
Anesthesia Post Note  Patient: Brian Banks  Procedure(s) Performed: COLONOSCOPY  Patient location during evaluation: Endoscopy Anesthesia Type: General Level of consciousness: awake and alert Pain management: pain level controlled Vital Signs Assessment: post-procedure vital signs reviewed and stable Respiratory status: spontaneous breathing, nonlabored ventilation, respiratory function stable and patient connected to nasal cannula oxygen Cardiovascular status: blood pressure returned to baseline and stable Postop Assessment: no apparent nausea or vomiting Anesthetic complications: no   No notable events documented.   Last Vitals:  Vitals:   11/05/21 1119 11/05/21 1128  BP: 118/68 119/72  Pulse: 66 60  Resp: 17 16  Temp: (!) 36.2 C   SpO2: 100% 100%    Last Pain:  Vitals:   11/06/21 0753  TempSrc:   PainSc: 0-No pain                 Martha Clan

## 2021-11-09 IMAGING — DX DG FINGER THUMB 2+V*R*
3 series · 3 of 3 positions shown · non-contrast
Comparison: None.

CLINICAL DATA: Right thumb laceration from accident with tractor

EXAM:
RIGHT THUMB 2+V

[finger ap]
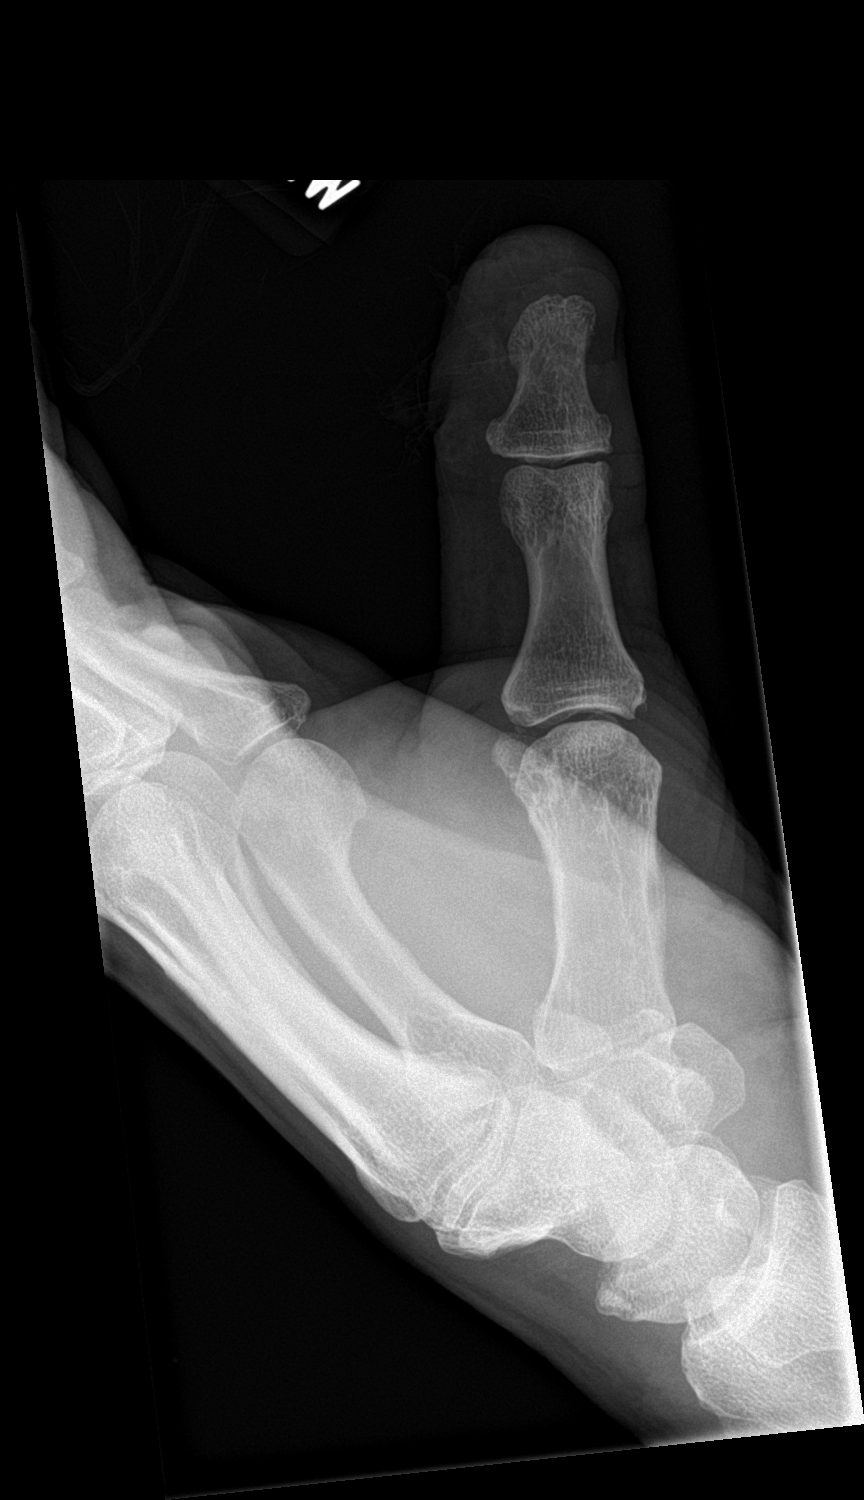

[finger obl]
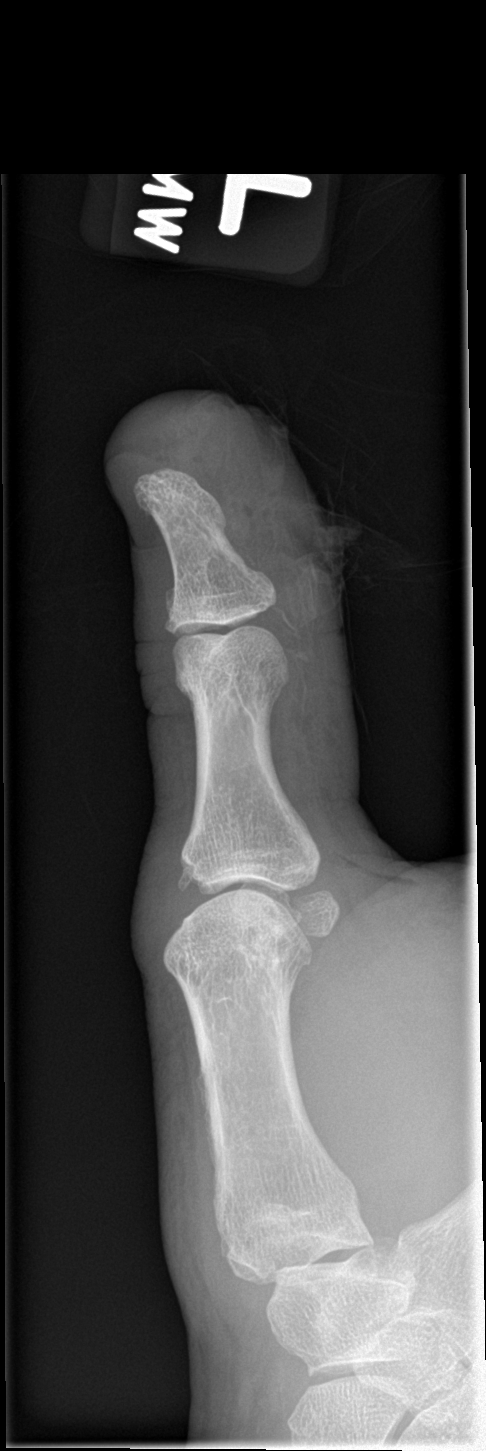

[finger lat]
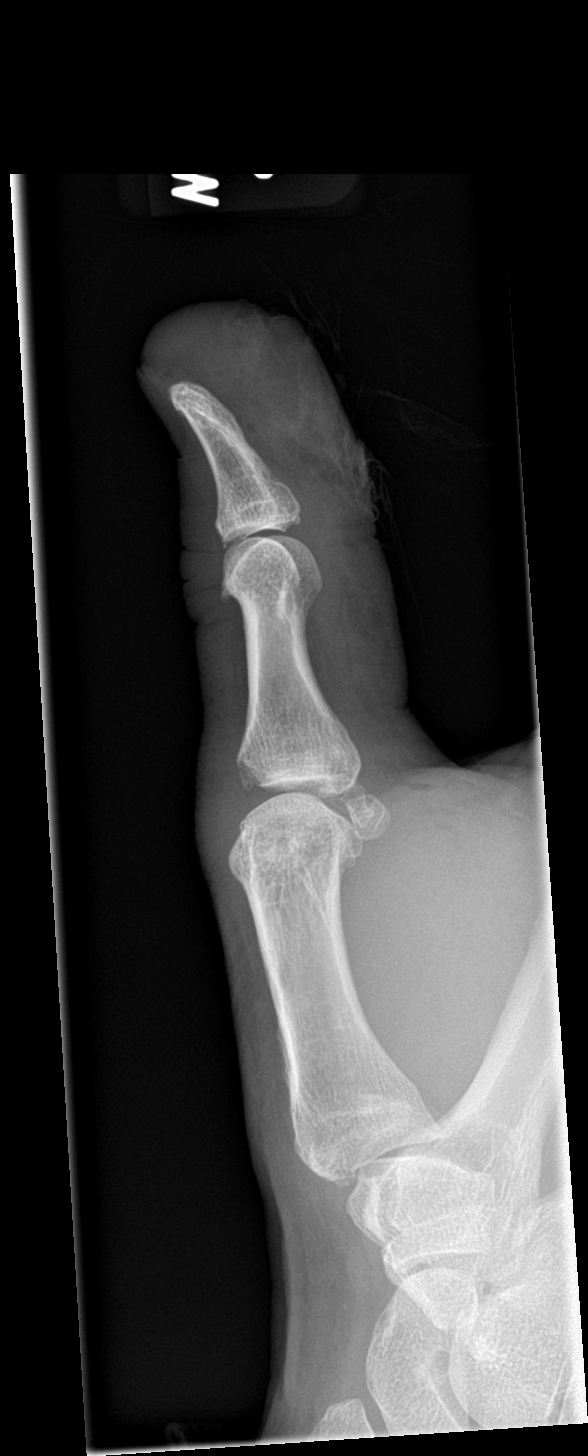

[3 of 3 positions shown; findings below may reference images not displayed]

FINDINGS: There is no evidence of fracture or dislocation. There is no
evidence of arthropathy or other focal bone abnormality. Cutaneous
skin defect along the volar aspect of the distal first digit with
subcutaneous edema throughout the digit and punctate foci of gas
which extend into close approximation with the cortical surface of
the distal phalange. No radiopaque foreign body.
IMPRESSION: Cutaneous skin defect along the volar aspect of the distal first
digit with subcutaneous edema throughout the digit and punctate foci
of gas which extend into close approximation with the osseous
surface. No visualized radiopaque foreign body or acute osseous
injury.

## 2021-11-14 ENCOUNTER — Other Ambulatory Visit: Payer: Self-pay

## 2021-11-14 MED ORDER — PANTOPRAZOLE SODIUM 40 MG PO TBEC: 40.0000 mg | DELAYED_RELEASE_TABLET | Freq: Every day | ORAL | 1 refills | Status: DC

## 2021-11-14 NOTE — Telephone Encounter (Signed)
Refill to pharmacy 

## 2021-11-27 ENCOUNTER — Other Ambulatory Visit: Payer: Self-pay | Admitting: Cardiovascular Disease

## 2021-12-20 ENCOUNTER — Ambulatory Visit: Payer: PPO | Attending: Cardiovascular Disease | Admitting: Cardiovascular Disease

## 2021-12-20 ENCOUNTER — Encounter: Payer: Self-pay | Admitting: Cardiovascular Disease

## 2021-12-20 VITALS — BP 100/60 | HR 68 | Ht 77.0 in | Wt 287.0 lb

## 2021-12-20 DIAGNOSIS — I5022 Chronic systolic (congestive) heart failure: Secondary | ICD-10-CM | POA: Diagnosis not present

## 2021-12-20 DIAGNOSIS — I10 Essential (primary) hypertension: Secondary | ICD-10-CM

## 2021-12-20 DIAGNOSIS — I2510 Atherosclerotic heart disease of native coronary artery without angina pectoris: Secondary | ICD-10-CM | POA: Diagnosis not present

## 2021-12-20 DIAGNOSIS — E785 Hyperlipidemia, unspecified: Secondary | ICD-10-CM

## 2021-12-20 NOTE — Progress Notes (Signed)
Cardiology Office Note   Date:  12/20/2021   ID:  Brian Banks, DOB May 04, 1945, MRN [id number]  PCP:  Adin Hector, MD  Cardiologist:   [name], MD   Chief Complaint  Patient presents with   Other    F/u 6 Month f/u pt not sure if he's taking Plavix and believes he d/c Metoprolol since 06/2021. Please advise. Meds reviewed verbally with pt.      History of Present Illness: Brian Banks is a 76 y.o. male who presents for a follow-up visit regarding coronary artery disease status post CABG in June, 2022. He has known history of hyperlipidemia, GERD, borderline diabetes, obesity and cigar use. He presented in June, 2022 with unstable angina.  Cardiac catheterization showed left dominant coronary arteries with moderate left main disease, critical ostial LAD stenosis with tandem lesions in the proximal segment and significant ostial left PDA stenosis.  Ejection fraction was normal with mildly elevated left ventricular end-diastolic pressure.  The patient underwent CABG at [facility] with LIMA to LAD, SVG to left PDA and SVG to OM 2.  The patient had blood loss anemia that required transfusion and also had PVCs that were treated with amiodarone which was subsequently discontinued.  Echocardiogram in October 2022 showed an EF of 45 to 50%.  He has been doing very well with no chest pain, shortness of breath or palpitations.  He takes his medications regularly.  No dizziness or syncope.     Past Medical History:  Diagnosis Date   Benign neoplasm of large bowel    BPH with obstruction/lower urinary tract symptoms    Dysphagia    Erectile dysfunction    Heartburn    History of stomach ulcers    HLD (hyperlipidemia)    Hypogonadism in male    Obesity    Urinary frequency    Urinary urgency    Vitamin D deficiency     Past Surgical History:  Procedure Laterality Date   APPENDECTOMY     CARDIAC CATHETERIZATION     COLONOSCOPY N/A 11/05/2021   Procedure: COLONOSCOPY;   Surgeon: Annamaria Helling, DO;  Location: [facility] ENDOSCOPY;  Service: Gastroenterology;  Laterality: N/A;   CORONARY ARTERY BYPASS GRAFT N/A 07/19/2020   Procedure: CORONARY ARTERY BYPASS GRAFTING (CABG) TIMES THREE ON PUMP USING LEFT INTERNAL MAMMARY ARTERY AND ENDOSCOPICALLY HARVESTED RIGHT GREATER SAPHENOUS VEIN;  Surgeon: Wonda Olds, MD;  Location: [place];  Service: Open Heart Surgery;  Laterality: N/A;   ENDOVEIN HARVEST OF GREATER SAPHENOUS VEIN Right 07/19/2020   Procedure: ENDOVEIN HARVEST OF GREATER SAPHENOUS VEIN;  Surgeon: Wonda Olds, MD;  Location: [place];  Service: Open Heart Surgery;  Laterality: Right;   LEFT HEART CATH AND CORONARY ANGIOGRAPHY N/A 07/18/2020   Procedure: LEFT HEART CATH AND CORONARY ANGIOGRAPHY;  Surgeon: Wellington Hampshire, MD;  Location: [place] CV LAB;  Service: Cardiovascular;  Laterality: N/A;   TEE WITHOUT CARDIOVERSION N/A 07/19/2020   Procedure: TRANSESOPHAGEAL ECHOCARDIOGRAM (TEE);  Surgeon: Wonda Olds, MD;  Location: [place];  Service: Open Heart Surgery;  Laterality: N/A;     Current Outpatient Medications  Medication Sig Dispense Refill   aspirin EC 81 MG EC tablet Take 1 tablet (81 mg total) by mouth daily. Swallow whole.     finasteride (PROSCAR) 5 MG tablet TAKE 1 TABLET BY MOUTH DAILY 90 tablet 3   metoprolol succinate (TOPROL-XL) 25 MG 24 hr tablet Take 25 mg by mouth daily.  pantoprazole (PROTONIX) 40 MG tablet Take 1 tablet (40 mg total) by mouth daily. 90 tablet 1   rosuvastatin (CRESTOR) 20 MG tablet Take 1 tablet (20 mg total) by mouth daily. 90 tablet 3   No current facility-administered medications for this visit.    Allergies:   Lovastatin, Quinolones, and Androgel [testosterone]    Social History:  The patient  reports that he has been smoking cigars. He has never used smokeless tobacco. He reports that he does not currently use alcohol. He reports that he does not use drugs.   Family History:  The  patient's family history includes Alzheimer's disease in his father; Diabetes Mellitus II in his mother; Prostate cancer in his brother; Stroke in his mother.    ROS:  Please see the history of present illness.   Otherwise, review of systems are positive for none.   All other systems are reviewed and negative.    PHYSICAL EXAM: VS:  BP 100/60 (BP Location: Left Arm, Patient Position: Sitting, Cuff Size: Large)   Pulse 68   Ht '6\' 5"'$  (1.956 m)   Wt 287 lb (130.2 kg)   SpO2 95%   BMI 34.03 kg/m  , BMI Body mass index is 34.03 kg/m. GEN: Well nourished, well developed, in no acute distress  HEENT: normal  Neck: no JVD, carotid bruits, or masses Cardiac: RRR; no murmurs, rubs, or gallops, trace edema  Respiratory:  clear to auscultation bilaterally, normal work of breathing GI: soft, nontender, nondistended, + BS MS: no deformity or atrophy  Skin: warm and dry, no rash Neuro:  Strength and sensation are intact Psych: euthymic mood, full affect   EKG:  EKG is ordered today. The ekg ordered today demonstrates sinus rhythm with first-degree AV block and nonspecific T wave changes.   Recent Labs: No results found for requested labs within last 365 days.    Lipid Panel    Component Value Date/Time   CHOL 179 07/18/2020 1019   TRIG 95 07/18/2020 1019   HDL 43 07/18/2020 1019   CHOLHDL 4.2 07/18/2020 1019   VLDL 19 07/18/2020 1019   LDLCALC 117 (H) 07/18/2020 1019      Wt Readings from Last 3 Encounters:  12/20/21 287 lb (130.2 kg)  11/05/21 280 lb 9.3 oz (127.3 kg)  06/14/21 284 lb (128.8 kg)           No data to display            ASSESSMENT AND PLAN:  1.  Coronary artery disease involving native coronary artery status post CABG: He is overall doing very well with no anginal symptoms.   Continue aspirin daily.  Clopidogrel was discontinued last year.  2.  Chronic systolic heart failure: EF of 45 to 50%.  No evidence of volume overload.  Continue small dose  Toprol.  Blood pressure is low and does not allow the addition of ACE inhibitors or ARB's.  3.  Frequent PVCs: Well controlled with Toprol.  4.  Hyperlipidemia: I reviewed most recent lipid profile done in July which showed an LDL of 22.  5.  Essential hypertension: Blood pressures controlled.  6.  History of cigar use: He usually smokes 1 cigar a day.  I discussed with him the importance of cessation.  7.  Sleep apnea: He is no longer on CPAP as he does not feel it helped much and he feels fine without it.    Disposition:   FU with me in 12 months  Signed,  [name], MD  12/20/2021 8:50 AM    [facility]

## 2021-12-20 NOTE — Patient Instructions (Signed)
Medication Instructions:  No changes *If you need a refill on your cardiac medications before your next appointment, please call your pharmacy*   Lab Work: None ordered If you have labs (blood work) drawn today and your tests are completely normal, you will receive your results only by: [place] (if you have MyChart) OR A paper copy in the mail If you have any lab test that is abnormal or we need to change your treatment, we will call you to review the results.   Testing/Procedures: None ordered   Follow-Up: At [facility], you and your health needs are our priority.  As part of our continuing mission to provide you with exceptional heart care, we have created designated Provider Care Teams.  These Care Teams include your primary Cardiologist (physician) and Advanced Practice Providers (APPs -  Physician Assistants and Nurse Practitioners) who all work together to provide you with the care you need, when you need it.  We recommend signing up for the patient portal called "MyChart".  Sign up information is provided on this After Visit Summary.  MyChart is used to connect with patients for Virtual Visits (Telemedicine).  Patients are able to view lab/test results, encounter notes, upcoming appointments, etc.  Non-urgent messages can be sent to your provider as well.   To learn more about what you can do with MyChart, go to [email or web address].    Your next appointment:   12 month(s)  The format for your next appointment:   In Person  Provider:   [name], MD     Important Information About Sugar

## 2022-02-07 ENCOUNTER — Other Ambulatory Visit: Payer: Self-pay | Admitting: Urology

## 2022-02-07 ENCOUNTER — Other Ambulatory Visit: Payer: Self-pay | Admitting: Cardiovascular Disease

## 2022-02-07 DIAGNOSIS — N3941 Urge incontinence: Secondary | ICD-10-CM

## 2022-03-01 ENCOUNTER — Other Ambulatory Visit: Payer: Self-pay

## 2022-03-01 DIAGNOSIS — N138 Other obstructive and reflux uropathy: Secondary | ICD-10-CM

## 2022-03-04 ENCOUNTER — Other Ambulatory Visit: Payer: PPO

## 2022-03-06 ENCOUNTER — Ambulatory Visit: Payer: PPO | Admitting: Urology

## 2022-03-12 ENCOUNTER — Other Ambulatory Visit: Payer: Self-pay | Admitting: Cardiovascular Disease

## 2022-03-12 ENCOUNTER — Other Ambulatory Visit: Payer: PPO

## 2022-03-12 DIAGNOSIS — R7303 Prediabetes: Secondary | ICD-10-CM | POA: Diagnosis not present

## 2022-03-12 DIAGNOSIS — N138 Other obstructive and reflux uropathy: Secondary | ICD-10-CM | POA: Diagnosis not present

## 2022-03-12 DIAGNOSIS — K219 Gastro-esophageal reflux disease without esophagitis: Secondary | ICD-10-CM | POA: Diagnosis not present

## 2022-03-12 DIAGNOSIS — N401 Enlarged prostate with lower urinary tract symptoms: Secondary | ICD-10-CM

## 2022-03-12 DIAGNOSIS — E349 Endocrine disorder, unspecified: Secondary | ICD-10-CM | POA: Diagnosis not present

## 2022-03-12 DIAGNOSIS — E7849 Other hyperlipidemia: Secondary | ICD-10-CM | POA: Diagnosis not present

## 2022-03-13 LAB — PSA: Prostate Specific Ag, Serum: 0.7 ng/mL (ref 0.0–4.0)

## 2022-03-14 NOTE — Progress Notes (Signed)
03/15/2022  6:40 PM   Brian Banks 1945-03-22 [id number]  Referring provider: Lynnea Ferrier, MD [address] [facility] [place],  [place] [address]  Chief Complaint  Patient presents with   Benign Prostatic Hypertrophy   Urological history 1. BPH with LU TS -PSA (02/2022) 0.7 -cysto 03/2019- moderate lateral lobe enlargement prostate - mild elevation bladder neck -I PSS 18/5 -finasteride 5 mg daily   2. Urge incontinence -contributing factors of age and BPH -not a candidate for anticholinergic therapy due to age and dry eyes  -failed Myrbetriq  -treated with PTNS  HPI: Brian Banks is a 77 y.o. male who presents today for one year follow up.   He is having issues with frequency, urgency and urge incontinence.  He states that he is wetting his underwear daily.  This is very bothersome to him.  Patient denies any modifying or aggravating factors.  Patient denies any gross hematuria, dysuria or suprapubic/flank pain.  Patient denies any fevers, chills, nausea or vomiting.    PVR 0 mL    IPSS     Row Name 03/15/22 1000         International Prostate Symptom Score   How often have you had the sensation of not emptying your bladder? About half the time  Simultaneous filing. User may not have seen previous data.     How often have you had to urinate less than every two hours? More than half the time  Simultaneous filing. User may not have seen previous data.     How often have you found you stopped and started again several times when you urinated? Less than 1 in 5 times  Simultaneous filing. User may not have seen previous data.     How often have you found it difficult to postpone urination? Almost always  Simultaneous filing. User may not have seen previous data.     How often have you had a weak urinary stream? Less than 1 in 5 times  Simultaneous filing. User may not have seen previous data.     How often have you had to strain to start  urination? Not at All  Simultaneous filing. User may not have seen previous data.     How many times did you typically get up at night to urinate? 4 Times  Simultaneous filing. User may not have seen previous data.     Total IPSS Score 18  Simultaneous filing. User may not have seen previous data.       Quality of Life due to urinary symptoms   If you were to spend the rest of your life with your urinary condition just the way it is now how would you feel about that? Unhappy  Simultaneous filing. User may not have seen previous data.                 Score:  1-7 Mild 8-19 Moderate 20-35 Severe   PMH: Past Medical History:  Diagnosis Date   Benign neoplasm of large bowel    BPH with obstruction/lower urinary tract symptoms    Dysphagia    Erectile dysfunction    Heartburn    History of stomach ulcers    HLD (hyperlipidemia)    Hypogonadism in male    Obesity    Urinary frequency    Urinary urgency    Vitamin D deficiency     Surgical History: Past Surgical History:  Procedure Laterality Date   APPENDECTOMY  CARDIAC CATHETERIZATION     COLONOSCOPY N/A 11/05/2021   Procedure: COLONOSCOPY;  Surgeon: Jaynie Collins, DO;  Location: [facility] ENDOSCOPY;  Service: Gastroenterology;  Laterality: N/A;   CORONARY ARTERY BYPASS GRAFT N/A 07/19/2020   Procedure: CORONARY ARTERY BYPASS GRAFTING (CABG) TIMES THREE ON PUMP USING LEFT INTERNAL MAMMARY ARTERY AND ENDOSCOPICALLY HARVESTED RIGHT GREATER SAPHENOUS VEIN;  Surgeon: Linden Dolin, MD;  Location: [facility] OR;  Service: Open Heart Surgery;  Laterality: N/A;   ENDOVEIN HARVEST OF GREATER SAPHENOUS VEIN Right 07/19/2020   Procedure: ENDOVEIN HARVEST OF GREATER SAPHENOUS VEIN;  Surgeon: Linden Dolin, MD;  Location: [facility] OR;  Service: Open Heart Surgery;  Laterality: Right;   LEFT HEART CATH AND CORONARY ANGIOGRAPHY N/A 07/18/2020   Procedure: LEFT HEART CATH AND CORONARY ANGIOGRAPHY;  Surgeon: Iran Ouch, MD;   Location: [facility] INVASIVE CV LAB;  Service: Cardiovascular;  Laterality: N/A;   TEE WITHOUT CARDIOVERSION N/A 07/19/2020   Procedure: TRANSESOPHAGEAL ECHOCARDIOGRAM (TEE);  Surgeon: Linden Dolin, MD;  Location: [facility] OR;  Service: Open Heart Surgery;  Laterality: N/A;    Home Medications:  Allergies as of 03/15/2022       Reactions   Lovastatin    Reports statin intolerance/myalgias.  Tolerates Crestor 20 mg daily.   Quinolones    Contraindicated given aortic dilation   Androgel [testosterone] Rash        Medication List        Accurate as of March 15, 2022 11:59 PM. If you have any questions, ask your nurse or doctor.          aspirin EC 81 MG tablet Take 1 tablet (81 mg total) by mouth daily. Swallow whole.   finasteride 5 MG tablet Commonly known as: PROSCAR TAKE 1 TABLET BY MOUTH DAILY   Gemtesa 75 MG Tabs Generic drug: Vibegron Take 1 tablet (75 mg total) by mouth daily. Started by: Michiel Cowboy, PA-C   metoprolol succinate 25 MG 24 hr tablet Commonly known as: TOPROL-XL TAKE 1 TABLET BY MOUTH DAILY   pantoprazole 40 MG tablet Commonly known as: PROTONIX TAKE ONE TABLET BY MOUTH DAILY   rosuvastatin 20 MG tablet Commonly known as: CRESTOR Take 1 tablet (20 mg total) by mouth daily.        Allergies:  Allergies  Allergen Reactions   Lovastatin     Reports statin intolerance/myalgias.  Tolerates Crestor 20 mg daily.   Quinolones     Contraindicated given aortic dilation   Androgel [Testosterone] Rash    Family History: Family History  Problem Relation Age of Onset   Alzheimer's disease Father    Prostate cancer Brother    Diabetes Mellitus II Mother    Stroke Mother     Social History:  reports that he has been smoking cigars. He has never used smokeless tobacco. He reports that he does not currently use alcohol. He reports that he does not use drugs.  For pertinent review of systems please refer to history of present  illness  Physical Exam: BP 138/74   Pulse 73   Ht 6\' 5"  (1.956 m)   Wt 280 lb (127 kg)   BMI 33.20 kg/m   Constitutional:  Well nourished. Alert and oriented, No acute distress. HEENT: [place] AT, moist mucus membranes.  Trachea midline Cardiovascular: No clubbing, cyanosis, or edema. Respiratory: Normal respiratory effort, no increased work of breathing. GU: No CVA tenderness.  No bladder fullness or masses.  Patient with uncircumcised phallus. Foreskin easily retracted  Urethral  meatus is patent.  No penile discharge. No penile lesions or rashes. Scrotum without lesions, cysts, rashes and/or edema.  Testicles are located scrotally bilaterally. No masses are appreciated in the testicles. Left and right epididymis are normal. Neurologic: Grossly intact, no focal deficits, moving all 4 extremities. Psychiatric: Normal mood and affect.   Laboratory Data: Component     Latest Ref Rng 03/12/2022  Prostate Specific Ag, Serum     0.0 - 4.0 ng/mL 0.7   Serum creatinine (02/2022) 0.9 Hemoglobin A1c (02/2022) 6.7 Total cholesterol (02/2022) 105  Color Colorless, Straw, Light Yellow, Yellow, Dark Yellow Yellow  Clarity Clear Clear  Specific Gravity 1.005 - 1.030 1.025  pH, Urine 5.0 - 8.0 5.5  Protein, Urinalysis Negative mg/dL Trace Abnormal   Glucose, Urinalysis Negative mg/dL Negative  Ketones, Urinalysis Negative mg/dL Negative  Blood, Urinalysis Negative Negative  Nitrite, Urinalysis Negative Negative  Leukocyte Esterase, Urinalysis Negative Negative  Bilirubin, Urinalysis Negative Negative  Urobilinogen, Urinalysis 0.2 - 1.0 mg/dL 0.2  Mucous, Urine None Seen PRESENT Abnormal   WBC, UA <=5 /hpf 1  Red Blood Cells, Urinalysis <=3 /hpf <1  Bacteria, Urinalysis 0 - 5 /hpf 0-5  Squamous Epithelial Cells, Urinalysis /hpf 0  Resulting Agency  [facility]   Specimen Collected: 03/12/22 09:39   Performed by: [facility] Last Resulted: 03/12/22 10:39   Received From: [facility]  Result Received: 03/14/22 12:53   TSH (02/2022) 4.670  I have reviewed the labs.  Pertinent Imaging  03/15/22 10:55  Scan Result 0     Assessment & Plan:    1. BPH with LUTS -PSA stable -most bothersome symptoms are urgency and urge incontinence -Continue finasteride 5 mg daily  2. Urge incontinence -very bothersome to patient -he will provide an urine specimen for urinalysis and culture next week to rule out indolent infection and to evaluate for possible glucosuria or other conditions that may contribute to excessive urine production -I have given him Gemtesa 75 mg samples to take daily -If urine culture is negative then Leslye Peer is not effective, we will schedule appoint with Dr. Sherron Monday for his opinion  Return for pending urinalysis and urine culture .  Michiel Cowboy, PA-C [facility] [address] [place], [place] [address] [phone]

## 2022-03-15 ENCOUNTER — Ambulatory Visit (INDEPENDENT_AMBULATORY_CARE_PROVIDER_SITE_OTHER): Payer: PPO | Admitting: Urology

## 2022-03-15 ENCOUNTER — Encounter: Payer: Self-pay | Admitting: Urology

## 2022-03-15 VITALS — BP 138/74 | HR 73 | Ht 77.0 in | Wt 280.0 lb

## 2022-03-15 DIAGNOSIS — N3941 Urge incontinence: Secondary | ICD-10-CM

## 2022-03-15 DIAGNOSIS — N138 Other obstructive and reflux uropathy: Secondary | ICD-10-CM | POA: Diagnosis not present

## 2022-03-15 DIAGNOSIS — N401 Enlarged prostate with lower urinary tract symptoms: Secondary | ICD-10-CM

## 2022-03-15 LAB — BLADDER SCAN AMB NON-IMAGING: Scan Result: 0

## 2022-03-16 IMAGING — DX DG CHEST 2V
2 series · 2 of 2 positions shown · non-contrast
Comparison: Two days ago

CLINICAL DATA: History of cardiac surgery

EXAM:
CHEST - 2 VIEW

[chest pa]
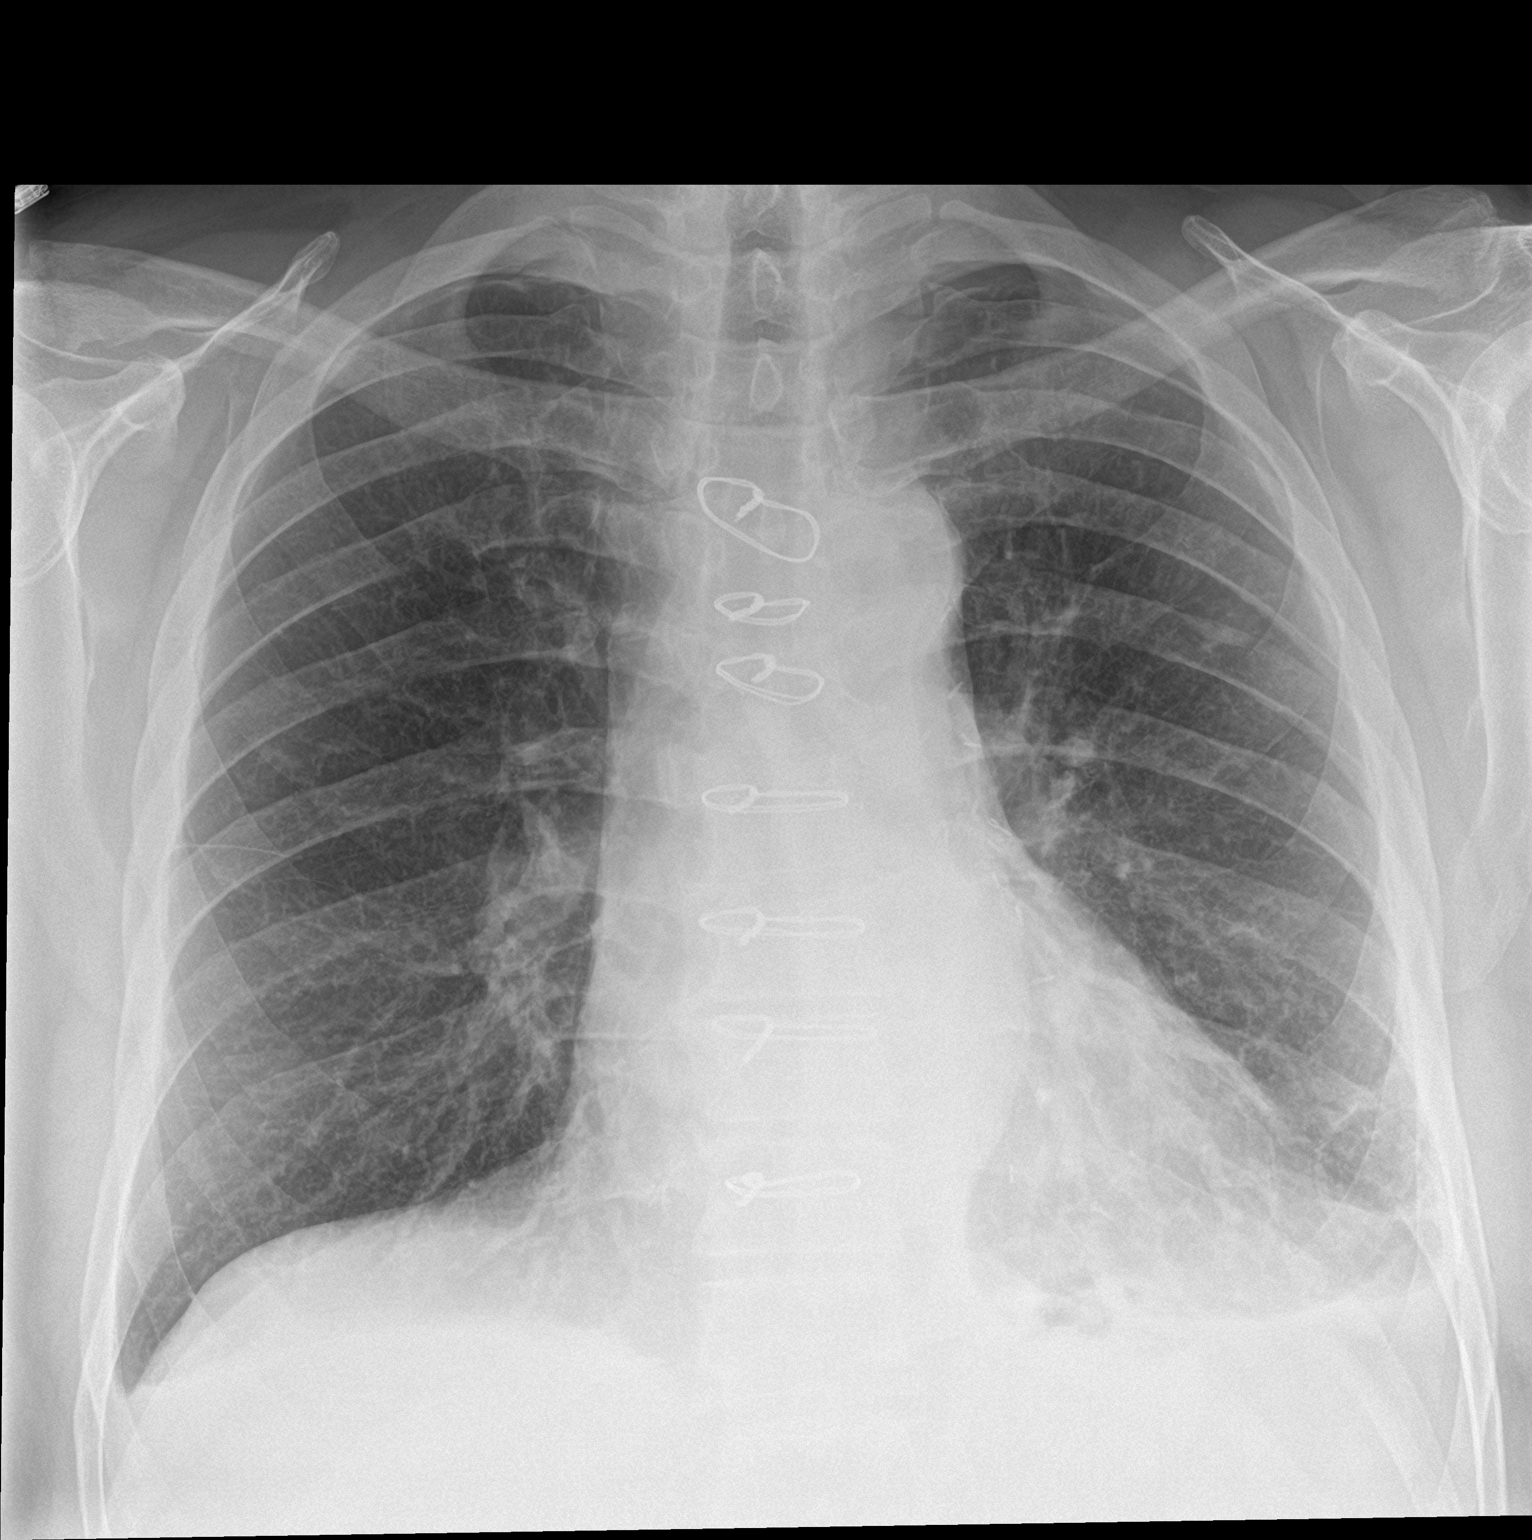

[chest lat]
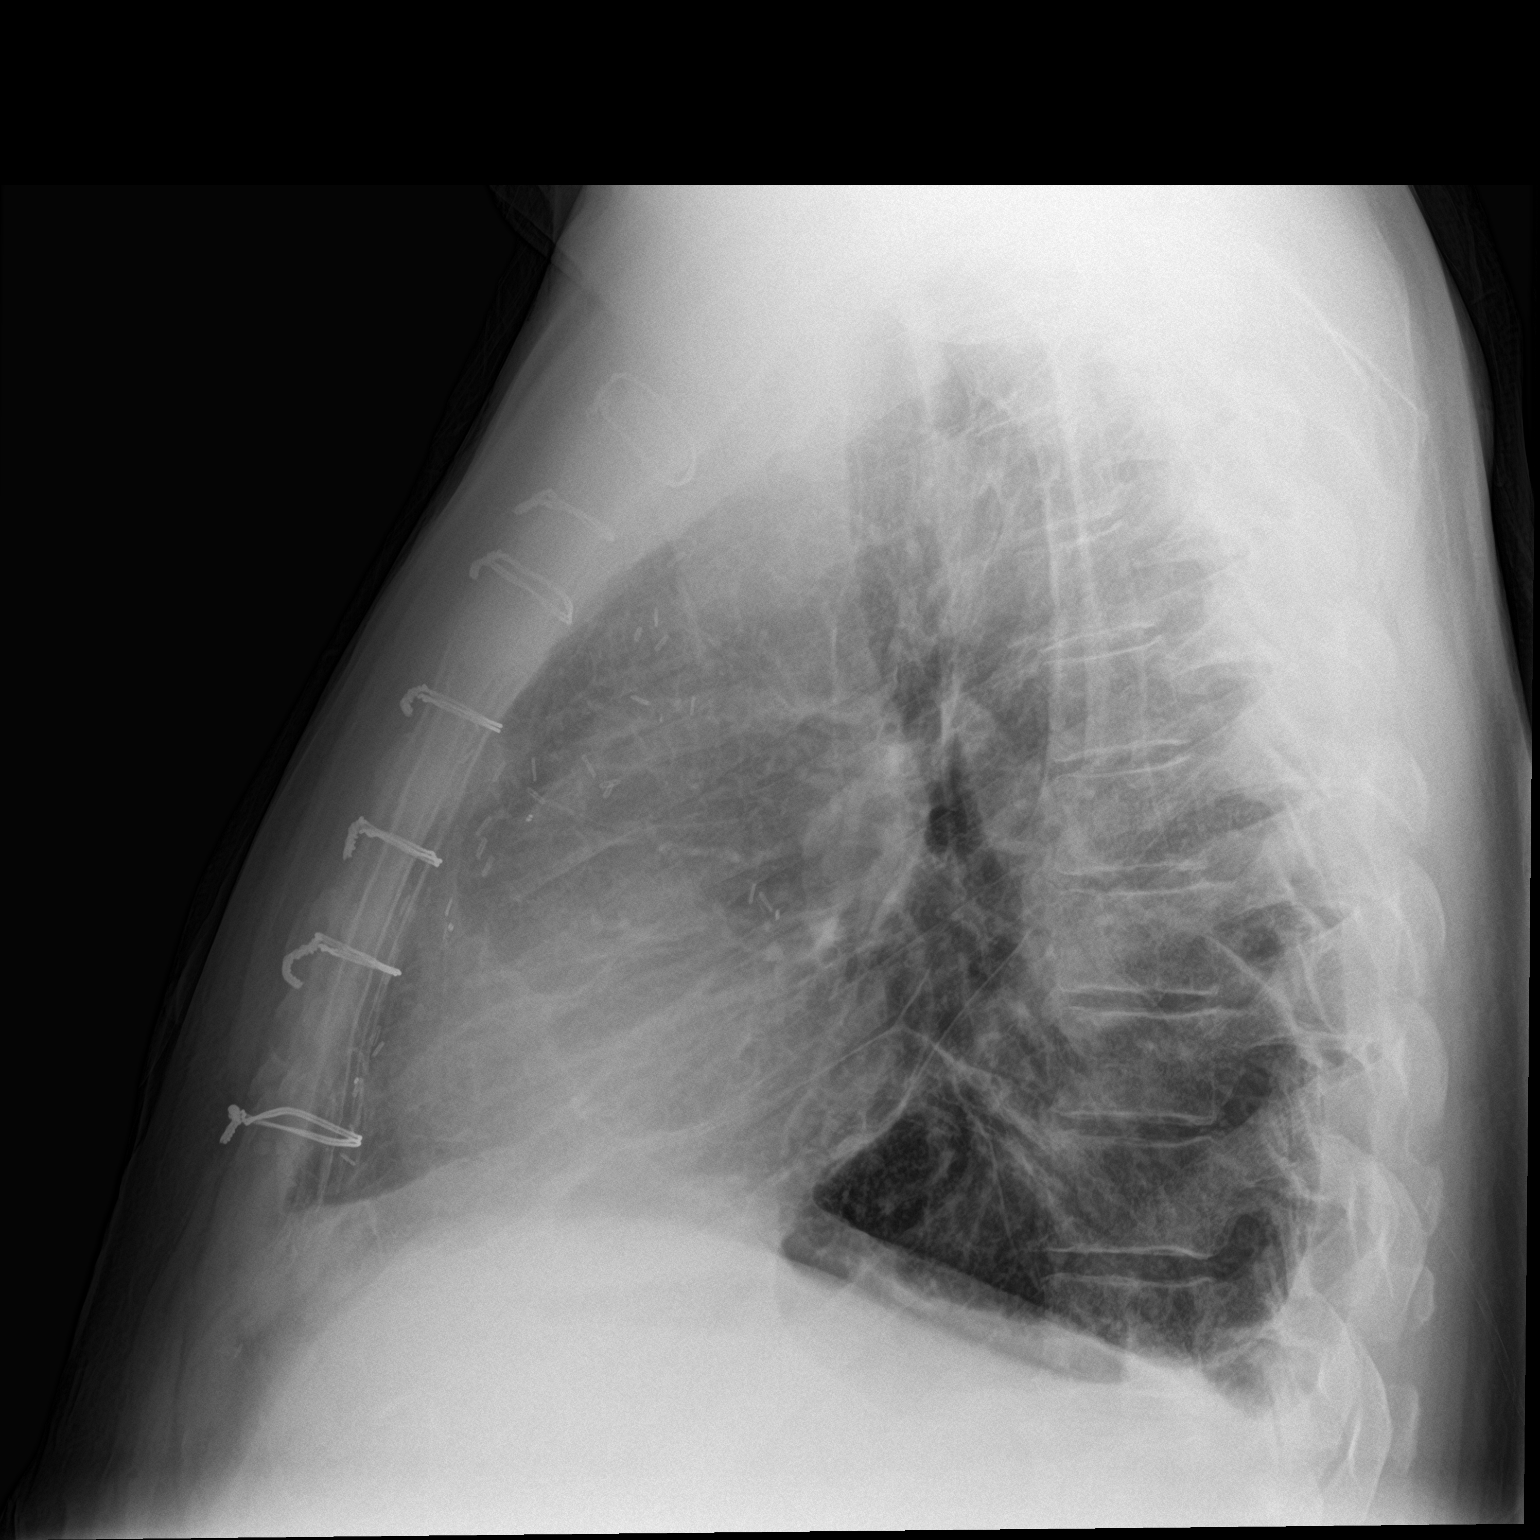

[2 of 2 positions shown; findings below may reference images not displayed]

FINDINGS: Decreased left pleural effusion and adjacent atelectasis. Trace left
apical pneumothorax. Stable heart size and mediastinal contours.
IMPRESSION: Improving atelectasis and pleural fluid. Trace left apical
pneumothorax.

## 2022-03-17 MED ORDER — GEMTESA 75 MG PO TABS: 75.0000 mg | ORAL_TABLET | Freq: Every day | ORAL | 0 refills | Status: DC

## 2022-03-18 ENCOUNTER — Other Ambulatory Visit: Payer: Self-pay | Admitting: Urology

## 2022-03-18 ENCOUNTER — Other Ambulatory Visit: Payer: PPO

## 2022-03-18 DIAGNOSIS — N401 Enlarged prostate with lower urinary tract symptoms: Secondary | ICD-10-CM | POA: Diagnosis not present

## 2022-03-18 DIAGNOSIS — N3941 Urge incontinence: Secondary | ICD-10-CM

## 2022-03-18 DIAGNOSIS — N138 Other obstructive and reflux uropathy: Secondary | ICD-10-CM | POA: Diagnosis not present

## 2022-03-18 LAB — MICROSCOPIC EXAMINATION

## 2022-03-18 LAB — URINALYSIS, COMPLETE
Bilirubin, UA: NEGATIVE
Glucose, UA: NEGATIVE
Ketones, UA: NEGATIVE
Leukocytes,UA: NEGATIVE
Nitrite, UA: NEGATIVE
Protein,UA: NEGATIVE
RBC, UA: NEGATIVE
Specific Gravity, UA: 1.025 (ref 1.005–1.030)
Urobilinogen, Ur: 0.2 mg/dL (ref 0.2–1.0)
pH, UA: 5 (ref 5.0–7.5)

## 2022-03-21 ENCOUNTER — Telehealth: Payer: Self-pay | Admitting: Family Medicine

## 2022-03-21 LAB — CULTURE, URINE COMPREHENSIVE

## 2022-03-21 NOTE — Telephone Encounter (Signed)
Patient notified and he states he seems to be doing much better on [place]. I informed him to call our office if he wants RX refilled and he wants to hold off for now with MacDiarmid because he may only need the medication.

## 2022-03-21 NOTE — Telephone Encounter (Signed)
-----   Message from Nori Riis, PA-C sent at 03/21/2022  1:45 PM EST ----- Please let Mr. Andujo know that his urine culture was negative.   I would like for him to see Dr. Matilde Sprang to see if he can offer some guidance.

## 2022-03-22 NOTE — Telephone Encounter (Signed)
Spoke to patient and appointments made for 1 year follow up.

## 2022-03-24 IMAGING — DX DG CHEST 2V
2 series · 2 of 2 positions shown · non-contrast
Comparison: 07/24/2020

CLINICAL DATA: History of coronary bypass grafting

EXAM:
CHEST - 2 VIEW

[dg chest 2 view (1 of 2)]
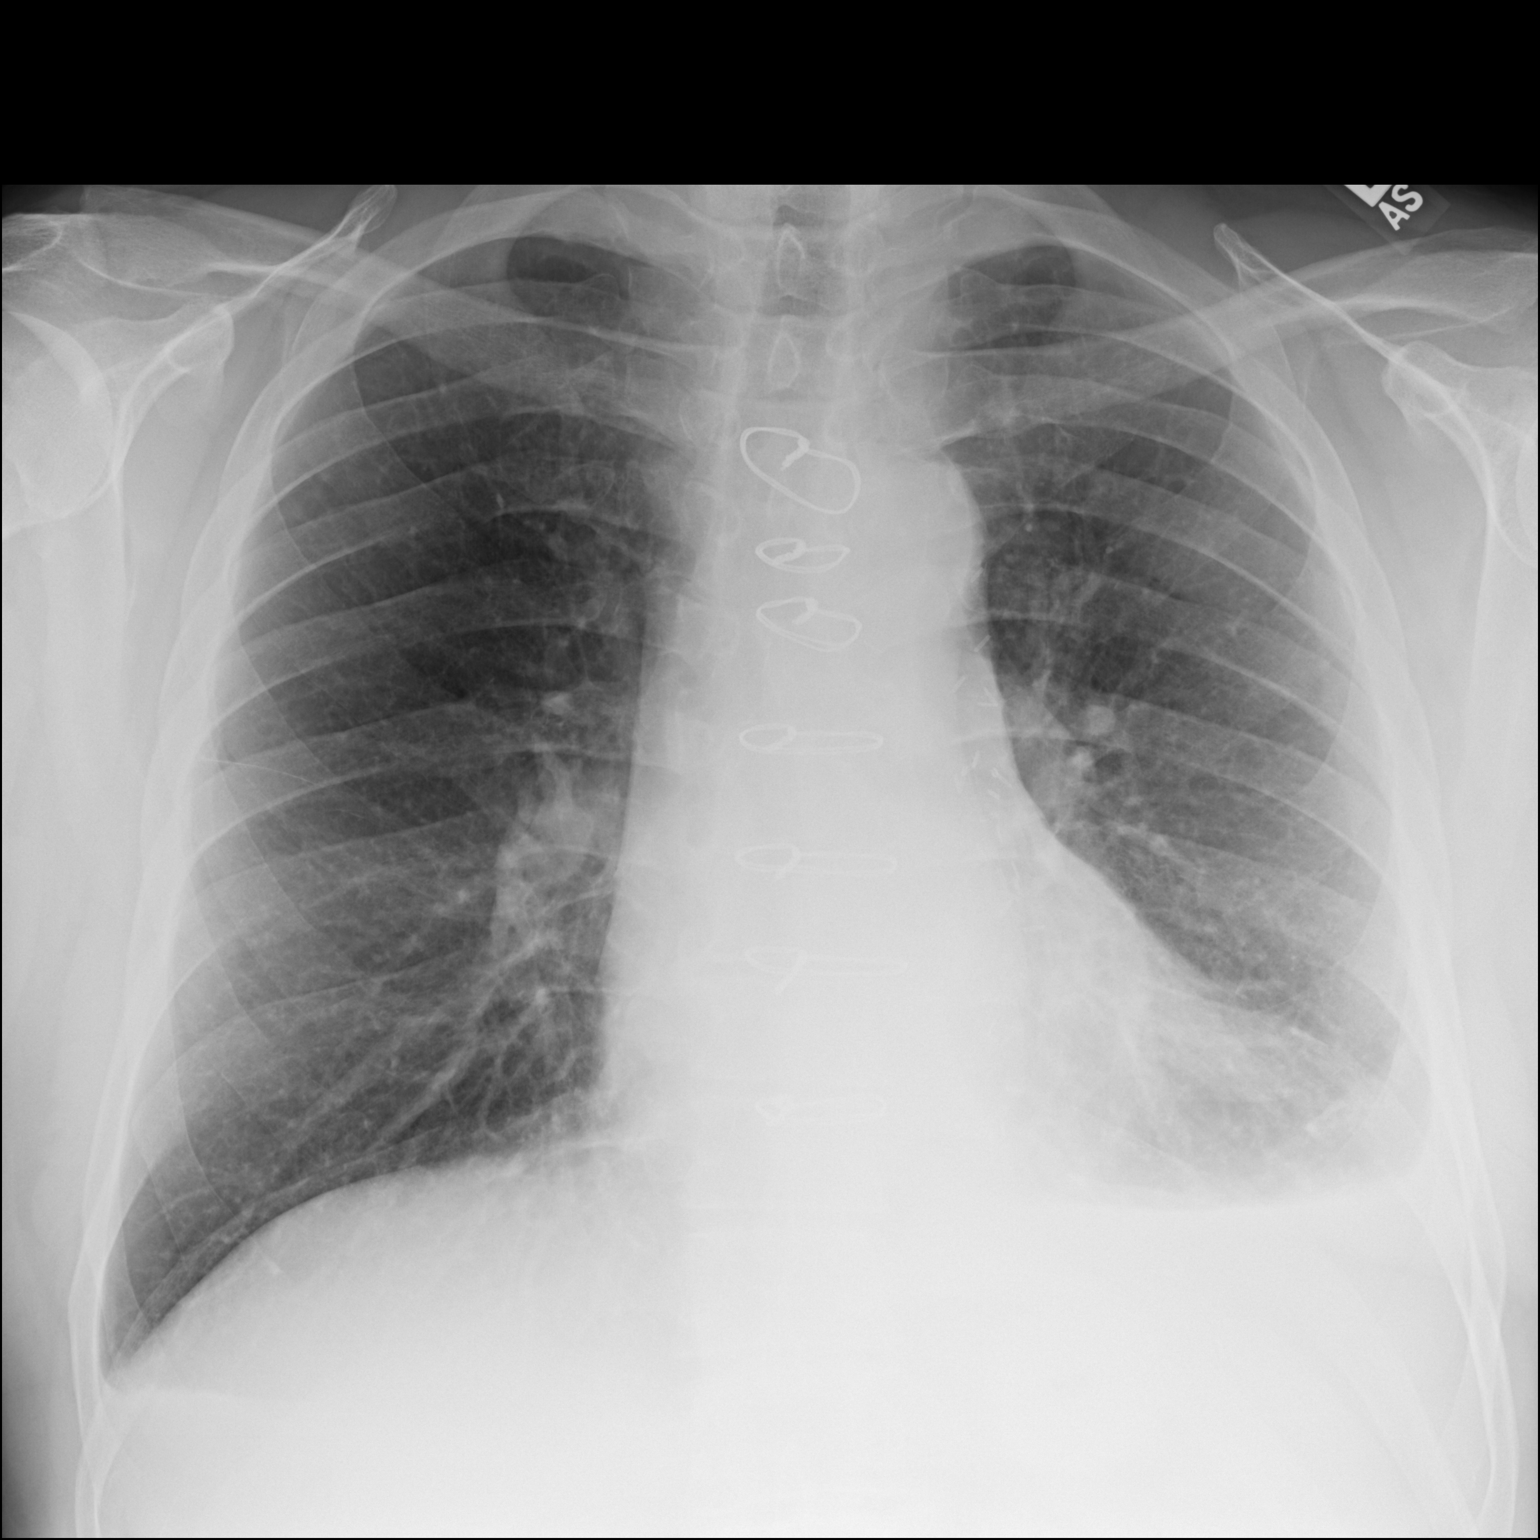

[dg chest 2 view (2 of 2)]
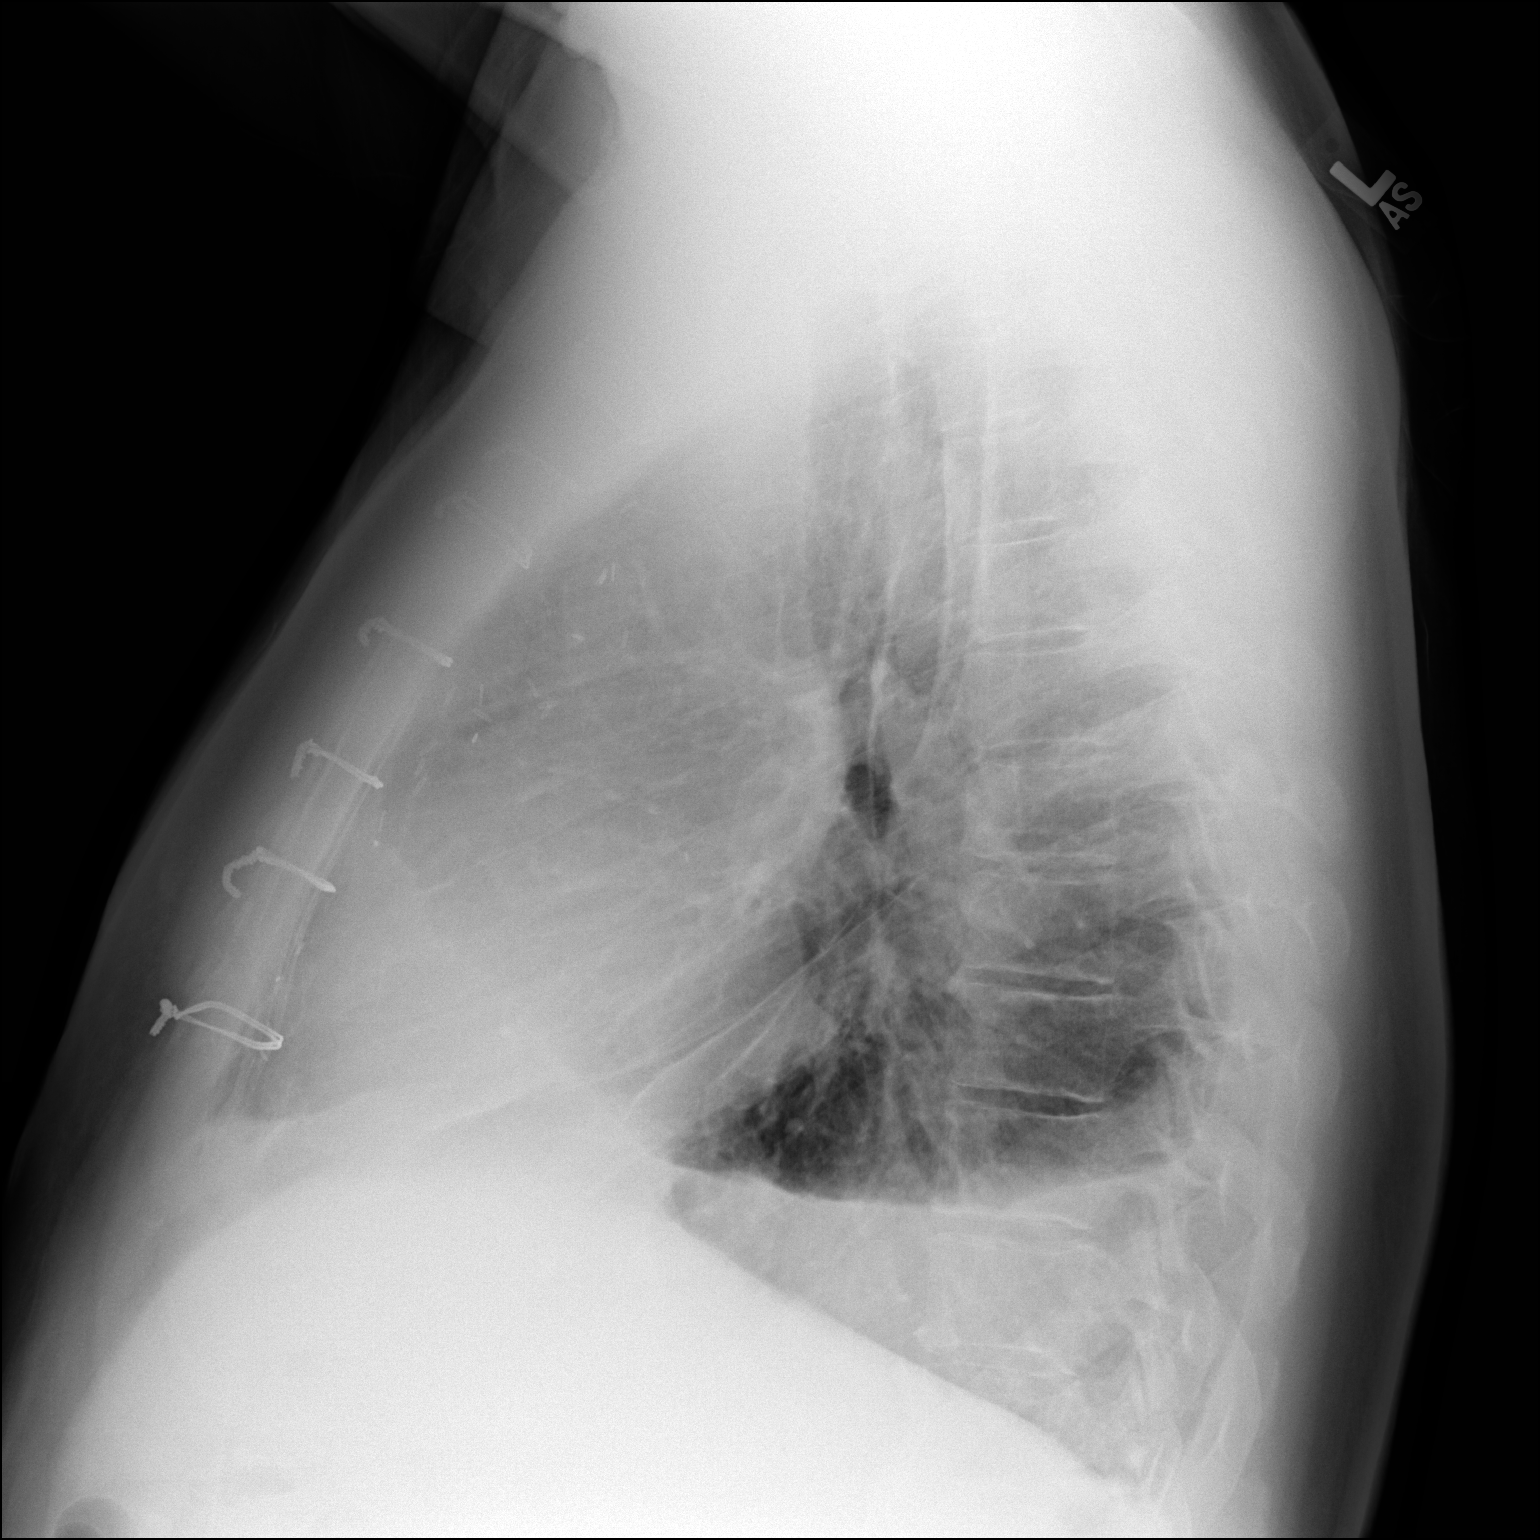

[2 of 2 positions shown; findings below may reference images not displayed]

FINDINGS: Cardiac shadow is stable. Postsurgical changes are again seen.
Chronic blunting of left costophrenic angle is noted. Previously
seen atelectasis has resolved. Right lung remains clear. No bony
abnormality is noted.
IMPRESSION: Stable left-sided effusion. Previously seen left basilar atelectasis
has resolved in the interval.

## 2022-04-03 DIAGNOSIS — R7989 Other specified abnormal findings of blood chemistry: Secondary | ICD-10-CM | POA: Diagnosis not present

## 2022-04-03 DIAGNOSIS — K760 Fatty (change of) liver, not elsewhere classified: Secondary | ICD-10-CM | POA: Diagnosis not present

## 2022-04-03 DIAGNOSIS — K219 Gastro-esophageal reflux disease without esophagitis: Secondary | ICD-10-CM | POA: Diagnosis not present

## 2022-04-03 DIAGNOSIS — E349 Endocrine disorder, unspecified: Secondary | ICD-10-CM | POA: Diagnosis not present

## 2022-04-03 DIAGNOSIS — I77810 Thoracic aortic ectasia: Secondary | ICD-10-CM | POA: Diagnosis not present

## 2022-04-03 DIAGNOSIS — Z2821 Immunization not carried out because of patient refusal: Secondary | ICD-10-CM | POA: Diagnosis not present

## 2022-04-03 DIAGNOSIS — I2510 Atherosclerotic heart disease of native coronary artery without angina pectoris: Secondary | ICD-10-CM | POA: Diagnosis not present

## 2022-04-03 DIAGNOSIS — R7303 Prediabetes: Secondary | ICD-10-CM | POA: Diagnosis not present

## 2022-04-03 DIAGNOSIS — M720 Palmar fascial fibromatosis [Dupuytren]: Secondary | ICD-10-CM | POA: Diagnosis not present

## 2022-04-03 DIAGNOSIS — E7849 Other hyperlipidemia: Secondary | ICD-10-CM | POA: Diagnosis not present

## 2022-04-03 DIAGNOSIS — Z0000 Encounter for general adult medical examination without abnormal findings: Secondary | ICD-10-CM | POA: Diagnosis not present

## 2022-04-04 ENCOUNTER — Other Ambulatory Visit: Payer: Self-pay | Admitting: Internal Medicine

## 2022-04-04 DIAGNOSIS — I2510 Atherosclerotic heart disease of native coronary artery without angina pectoris: Secondary | ICD-10-CM

## 2022-04-04 DIAGNOSIS — I77810 Thoracic aortic ectasia: Secondary | ICD-10-CM

## 2022-04-12 DIAGNOSIS — M720 Palmar fascial fibromatosis [Dupuytren]: Secondary | ICD-10-CM | POA: Diagnosis not present

## 2022-04-17 ENCOUNTER — Ambulatory Visit
Admission: RE | Admit: 2022-04-17 | Discharge: 2022-04-17 | Disposition: A | Discharge status: Home / Self Care | Payer: PPO | Source: Ambulatory Visit | Attending: Internal Medicine | Admitting: Internal Medicine

## 2022-04-17 DIAGNOSIS — J90 Pleural effusion, not elsewhere classified: Secondary | ICD-10-CM | POA: Diagnosis not present

## 2022-04-17 DIAGNOSIS — I77810 Thoracic aortic ectasia: Secondary | ICD-10-CM | POA: Insufficient documentation

## 2022-04-17 DIAGNOSIS — J9811 Atelectasis: Secondary | ICD-10-CM | POA: Diagnosis not present

## 2022-04-17 DIAGNOSIS — I2510 Atherosclerotic heart disease of native coronary artery without angina pectoris: Secondary | ICD-10-CM | POA: Insufficient documentation

## 2022-04-17 DIAGNOSIS — R911 Solitary pulmonary nodule: Secondary | ICD-10-CM | POA: Diagnosis not present

## 2022-04-17 MED ORDER — IOHEXOL 350 MG/ML SOLN
75.0000 mL | Freq: Once | INTRAVENOUS | Status: AC | PRN
  Administered 2022-04-17: 75 mL via INTRAVENOUS

## 2022-05-02 DIAGNOSIS — L821 Other seborrheic keratosis: Secondary | ICD-10-CM | POA: Diagnosis not present

## 2022-05-02 DIAGNOSIS — D2262 Melanocytic nevi of left upper limb, including shoulder: Secondary | ICD-10-CM | POA: Diagnosis not present

## 2022-05-02 DIAGNOSIS — D225 Melanocytic nevi of trunk: Secondary | ICD-10-CM | POA: Diagnosis not present

## 2022-05-02 DIAGNOSIS — D2272 Melanocytic nevi of left lower limb, including hip: Secondary | ICD-10-CM | POA: Diagnosis not present

## 2022-05-02 DIAGNOSIS — D2261 Melanocytic nevi of right upper limb, including shoulder: Secondary | ICD-10-CM | POA: Diagnosis not present

## 2022-05-02 DIAGNOSIS — D2271 Melanocytic nevi of right lower limb, including hip: Secondary | ICD-10-CM | POA: Diagnosis not present

## 2022-05-02 DIAGNOSIS — L570 Actinic keratosis: Secondary | ICD-10-CM | POA: Diagnosis not present

## 2022-05-02 DIAGNOSIS — L910 Hypertrophic scar: Secondary | ICD-10-CM | POA: Diagnosis not present

## 2022-06-21 ENCOUNTER — Other Ambulatory Visit: Payer: Self-pay | Admitting: Cardiovascular Disease

## 2022-08-01 ENCOUNTER — Other Ambulatory Visit: Payer: Self-pay | Admitting: Surgery

## 2022-08-05 DIAGNOSIS — M720 Palmar fascial fibromatosis [Dupuytren]: Secondary | ICD-10-CM | POA: Diagnosis not present

## 2022-08-06 ENCOUNTER — Encounter
Admission: RE | Admit: 2022-08-06 | Discharge: 2022-08-06 | Disposition: A | Discharge status: Home / Self Care | Payer: PPO | Source: Ambulatory Visit | Attending: Surgery | Admitting: Surgery

## 2022-08-06 VITALS — Ht 77.0 in | Wt 280.0 lb

## 2022-08-06 DIAGNOSIS — Z01812 Encounter for preprocedural laboratory examination: Secondary | ICD-10-CM

## 2022-08-06 DIAGNOSIS — I214 Non-ST elevation (NSTEMI) myocardial infarction: Secondary | ICD-10-CM

## 2022-08-06 DIAGNOSIS — Z951 Presence of aortocoronary bypass graft: Secondary | ICD-10-CM

## 2022-08-06 HISTORY — DX: Fatty (change of) liver, not elsewhere classified: K76.0

## 2022-08-06 HISTORY — DX: Gastro-esophageal reflux disease without esophagitis: K21.9

## 2022-08-06 NOTE — Patient Instructions (Addendum)
Your procedure is scheduled on: Wednesday, July 3 Report to the Registration Desk on the 1st floor of the [facility] To find out your arrival time, please call [phone] between 1PM - 3PM on: Tuesday, July 2 If your arrival time is 6:00 am, do not arrive before that time as the [facility] entrance doors do not open until 6:00 am.  REMEMBER: Instructions that are not followed completely may result in serious medical risk, up to and including death; or upon the discretion of your surgeon and anesthesiologist your surgery may need to be rescheduled.  Do not eat food after midnight the night before surgery.  No gum chewing or hard candies.  You may however, drink CLEAR liquids up to 2 hours before you are scheduled to arrive for your surgery. Do not drink anything within 2 hours of your scheduled arrival time.  Clear liquids include: - water  - apple juice without pulp - gatorade (not RED colors) - black coffee or tea (Do NOT add milk or creamers to the coffee or tea) Do NOT drink anything that is not on this list.  In addition, your doctor has ordered for you to drink the provided:  Ensure Pre-Surgery Clear Carbohydrate Drink  Drinking this carbohydrate drink up to two hours before surgery helps to reduce insulin resistance and improve patient outcomes. Please complete drinking 2 hours before scheduled arrival time.  One week prior to surgery: starting June 26 Stop Anti-inflammatories (NSAIDS) such as Advil, Aleve, Ibuprofen, Motrin, Naproxen, Naprosyn and Aspirin based products such as Excedrin, Goody's Powder, BC Powder. Stop ANY OVER THE COUNTER supplements until after surgery. You may however, continue to take Tylenol if needed for pain up until the day of surgery.  Continue taking all prescribed medications  Per your instructions from your surgeon, you may continue your 81 mg aspirin until the day of surgery.  TAKE ONLY THESE MEDICATIONS THE MORNING OF SURGERY WITH A SIP  OF WATER:  Finasteride Metoprolol Pantoprazole - (take one the night before and one on the morning of surgery - helps to prevent nausea after surgery.) Rosuvastatin  No Alcohol for 24 hours before or after surgery.  No Smoking including e-cigarettes for 24 hours before surgery.  No chewable tobacco products for at least 6 hours before surgery.  No nicotine patches on the day of surgery.  Do not use any "recreational" drugs for at least a week (preferably 2 weeks) before your surgery.  Please be advised that the combination of cocaine and anesthesia may have negative outcomes, up to and including death. If you test positive for cocaine, your surgery will be cancelled.  On the morning of surgery brush your teeth with toothpaste and water, you may rinse your mouth with mouthwash if you wish. Do not swallow any toothpaste or mouthwash.  Use CHG Soap as directed on instruction sheet.  Do not wear jewelry, make-up, hairpins, clips or nail polish.  Do not wear lotions, powders, or perfumes.   Do not shave body hair from the neck down 48 hours before surgery.  Contact lenses, hearing aids and dentures may not be worn into surgery.  Do not bring valuables to the hospital. [facility] is not responsible for any missing/lost belongings or valuables.   Notify your doctor if there is any change in your medical condition (cold, fever, infection).  Wear comfortable clothing (specific to your surgery type) to the hospital.  After surgery, you can help prevent lung complications by doing breathing exercises.  Take deep breaths and cough every 1-2 hours. Your doctor may order a device called an Incentive Spirometer to help you take deep breaths.  If you are being discharged the day of surgery, you will not be allowed to drive home. You will need a responsible individual to drive you home and stay with you for 24 hours after surgery.   If you are taking public transportation, you will need to  have a responsible individual with you.  Please call the Pre-admissions Testing Dept. at [phone] if you have any questions about these instructions.  Surgery Visitation Policy:  Patients having surgery or a procedure may have two visitors.  Children under the age of 61 must have an adult with them who is not the patient.     Preparing for Surgery with CHLORHEXIDINE GLUCONATE (CHG) Soap  Chlorhexidine Gluconate (CHG) Soap  o An antiseptic cleaner that kills germs and bonds with the skin to continue killing germs even after washing  o Used for showering the night before surgery and morning of surgery  Before surgery, you can play an important role by reducing the number of germs on your skin.  CHG (Chlorhexidine gluconate) soap is an antiseptic cleanser which kills germs and bonds with the skin to continue killing germs even after washing.  Please do not use if you have an allergy to CHG or antibacterial soaps. If your skin becomes reddened/irritated stop using the CHG.  1. Shower the NIGHT BEFORE SURGERY and the MORNING OF SURGERY with CHG soap.  2. If you choose to wash your hair, wash your hair first as usual with your normal shampoo.  3. After shampooing, rinse your hair and body thoroughly to remove the shampoo.  4. Use CHG as you would any other liquid soap. You can apply CHG directly to the skin and wash gently with a scrungie or a clean washcloth.  5. Apply the CHG soap to your body only from the neck down. Do not use on open wounds or open sores. Avoid contact with your eyes, ears, mouth, and genitals (private parts). Wash face and genitals (private parts) with your normal soap.  6. Wash thoroughly, paying special attention to the area where your surgery will be performed.  7. Thoroughly rinse your body with warm water.  8. Do not shower/wash with your normal soap after using and rinsing off the CHG soap.  9. Pat yourself dry with a clean towel.  10. Wear  clean pajamas to bed the night before surgery.  12. Place clean sheets on your bed the night of your first shower and do not sleep with pets.  13. Shower again with the CHG soap on the day of surgery prior to arriving at the hospital.  14. Do not apply any deodorants/lotions/powders.  15. Please wear clean clothes to the hospital.

## 2022-08-07 ENCOUNTER — Telehealth: Payer: Self-pay | Admitting: *Deleted

## 2022-08-07 ENCOUNTER — Encounter
Admission: RE | Admit: 2022-08-07 | Discharge: 2022-08-07 | Disposition: A | Discharge status: Home / Self Care | Payer: PPO | Source: Ambulatory Visit | Attending: Surgery | Admitting: Surgery

## 2022-08-07 DIAGNOSIS — I498 Other specified cardiac arrhythmias: Secondary | ICD-10-CM | POA: Diagnosis not present

## 2022-08-07 DIAGNOSIS — I214 Non-ST elevation (NSTEMI) myocardial infarction: Secondary | ICD-10-CM | POA: Diagnosis not present

## 2022-08-07 DIAGNOSIS — Z951 Presence of aortocoronary bypass graft: Secondary | ICD-10-CM | POA: Insufficient documentation

## 2022-08-07 DIAGNOSIS — Z01812 Encounter for preprocedural laboratory examination: Secondary | ICD-10-CM

## 2022-08-07 DIAGNOSIS — I4510 Unspecified right bundle-branch block: Secondary | ICD-10-CM | POA: Insufficient documentation

## 2022-08-07 DIAGNOSIS — Z01818 Encounter for other preprocedural examination: Secondary | ICD-10-CM | POA: Insufficient documentation

## 2022-08-07 DIAGNOSIS — Z01810 Encounter for preprocedural cardiovascular examination: Secondary | ICD-10-CM | POA: Diagnosis not present

## 2022-08-07 HISTORY — DX: Unspecified right bundle-branch block: I45.10

## 2022-08-07 LAB — BASIC METABOLIC PANEL
Anion gap: 10 (ref 5–15)
BUN: 20 mg/dL (ref 8–23)
CO2: 21 mmol/L — ABNORMAL LOW (ref 22–32)
Calcium: 8.3 mg/dL — ABNORMAL LOW (ref 8.9–10.3)
Chloride: 107 mmol/L (ref 98–111)
Creatinine, Ser: 0.99 mg/dL (ref 0.61–1.24)
GFR, Estimated: >60 mL/min (ref >60)
Glucose, Bld: 220 mg/dL — ABNORMAL HIGH (ref 70–99)
Potassium: 3.7 mmol/L (ref 3.5–5.1)
Sodium: 138 mmol/L (ref 135–145)

## 2022-08-07 LAB — CBC
HCT: 39.2 % (ref 39.0–52.0)
Hemoglobin: 12.9 g/dL — ABNORMAL LOW (ref 13.0–17.0)
MCH: 29.1 pg (ref 26.0–34.0)
MCHC: 32.9 g/dL (ref 30.0–36.0)
MCV: 88.5 fL (ref 80.0–100.0)
Platelets: 222 10*3/uL (ref 150–400)
RBC: 4.43 MIL/uL (ref 4.22–5.81)
RDW: 13.3 % (ref 11.5–15.5)
WBC: 6.5 10*3/uL (ref 4.0–10.5)
nRBC: 0 % (ref 0.0–0.2)

## 2022-08-07 NOTE — Telephone Encounter (Signed)
   Name: Brian Banks  DOB: 1945/06/11  MRN: [id number]  Primary Cardiologist: Lorine Bears, MD   Preoperative team, please contact this patient and set up a phone call appointment for further preoperative risk assessment. Please obtain consent and complete medication review. Thank you for your help.  I confirm that guidance regarding antiplatelet and oral anticoagulation therapy has been completed and, if necessary, noted below.  Regarding ASA therapy, we recommend continuation of ASA throughout the perioperative period.  However, if the surgeon feels that cessation of ASA is required in the perioperative period, it may be stopped 5-7 days prior to surgery with a plan to resume it as soon as felt to be feasible from a surgical standpoint in the post-operative period.    Napoleon Form, Leodis Rains, NP 08/07/2022, 9:54 AM  [facility]

## 2022-08-07 NOTE — Telephone Encounter (Signed)
-----   Message from Verlee Monte, NP sent at 08/07/2022  8:09 AM EDT ----- Regarding: Request for pre-operative cardiac clearance Request for pre-operative cardiac clearance:  1. What type of surgery is being performed?  LEFT LITTLE FINGER DUPUYTREN'S EXCISION  2. When is this surgery scheduled?  08/14/2022  3. Type of clearance being requested (medical, pharmacy, both)? MEDICAL   4. Are there any medications that need to be held prior to surgery? ASA  5. Practice name and name of physician performing surgery?  Performing surgeon: Dr. Leron Croak, MD Requesting clearance: Quentin Mulling, FNP-C    6. Anesthesia type (none, local, MAC, general)? GENERAL  7. What is the office phone and fax number?   Phone: [phone] Fax: [phone]  ATTENTION: Unable to create telephone message as per your standard workflow. Directed by [facility] providers to send requests for cardiac clearance to this pool for appropriate distribution to provider covering pre-operative clearances.   Quentin Mulling, MSN, APRN, FNP-C, CEN [facility]  Peri-operative Services Nurse Practitioner Phone: [phone] 08/07/22 8:09 AM

## 2022-08-07 NOTE — Telephone Encounter (Signed)
Pt has been scheduled for tele pre op appt 08/08/22 @ 9:40. Med rec and consent are done.     Patient Consent for Virtual Visit        Brian Banks has provided verbal consent on 08/07/2022 for a virtual visit (video or telephone).   CONSENT FOR VIRTUAL VISIT FOR:  Brian Banks  By participating in this virtual visit I agree to the following:  I hereby voluntarily request, consent and authorize [facility] and its employed or contracted physicians, physician assistants, nurse practitioners or other licensed health care professionals (the Practitioner), to provide me with telemedicine health care services (the "Services") as deemed necessary by the treating Practitioner. I acknowledge and consent to receive the Services by the Practitioner via telemedicine. I understand that the telemedicine visit will involve communicating with the Practitioner through live audiovisual communication technology and the disclosure of certain medical information by electronic transmission. I acknowledge that I have been given the opportunity to request an in-person assessment or other available alternative prior to the telemedicine visit and am voluntarily participating in the telemedicine visit.  I understand that I have the right to withhold or withdraw my consent to the use of telemedicine in the course of my care at any time, without affecting my right to future care or treatment, and that the Practitioner or I may terminate the telemedicine visit at any time. I understand that I have the right to inspect all information obtained and/or recorded in the course of the telemedicine visit and may receive copies of available information for a reasonable fee.  I understand that some of the potential risks of receiving the Services via telemedicine include:  Delay or interruption in medical evaluation due to technological equipment failure or disruption; Information transmitted may not be sufficient (e.g.  poor resolution of images) to allow for appropriate medical decision making by the Practitioner; and/or  In rare instances, security protocols could fail, causing a breach of personal health information.  Furthermore, I acknowledge that it is my responsibility to provide information about my medical history, conditions and care that is complete and accurate to the best of my ability. I acknowledge that Practitioner's advice, recommendations, and/or decision may be based on factors not within their control, such as incomplete or inaccurate data provided by me or distortions of diagnostic images or specimens that may result from electronic transmissions. I understand that the practice of medicine is not an exact science and that Practitioner makes no warranties or guarantees regarding treatment outcomes. I acknowledge that a copy of this consent can be made available to me via my patient portal [facility] MyChart), or I can request a printed copy by calling the office of [facility].    I understand that my insurance will be billed for this visit.   I have read or had this consent read to me. I understand the contents of this consent, which adequately explains the benefits and risks of the Services being provided via telemedicine.  I have been provided ample opportunity to ask questions regarding this consent and the Services and have had my questions answered to my satisfaction. I give my informed consent for the services to be provided through the use of telemedicine in my medical care

## 2022-08-07 NOTE — Telephone Encounter (Signed)
Pt has been scheduled for tele pre op appt 08/08/22 @ 9:40. Med rec and consent are done.

## 2022-08-08 ENCOUNTER — Encounter: Payer: Self-pay | Admitting: Surgery

## 2022-08-08 ENCOUNTER — Ambulatory Visit: Payer: PPO | Attending: Cardiology

## 2022-08-08 DIAGNOSIS — Z01810 Encounter for preprocedural cardiovascular examination: Secondary | ICD-10-CM | POA: Diagnosis not present

## 2022-08-08 NOTE — Progress Notes (Signed)
Virtual Visit via Telephone Note   Because of Brian Banks's co-morbid illnesses, he is at least at moderate risk for complications without adequate follow up.  This format is felt to be most appropriate for this patient at this time.  The patient did not have access to video technology/had technical difficulties with video requiring transitioning to audio format only (telephone).  All issues noted in this document were discussed and addressed.  No physical exam could be performed with this format.  Please refer to the patient's chart for his consent to telehealth for [facility].  Evaluation Performed:  Preoperative cardiovascular risk assessment _____________   Date:  08/08/2022   Patient ID:  Brian Banks, DOB [birth year], MRN [id number] Patient Location:  Home Provider location:   Office  Primary Care Provider:  Lynnea Ferrier, MD Primary Cardiologist:  Lorine Bears, MD  Chief Complaint / Patient Profile   77 y.o. y/o male with a h/o CAD s/p CABG 07/2020, HLD, BPH, obesity HFpEF, HTN who is pending left finger Dupuytren's Excision and presents today for telephonic preoperative cardiovascular risk assessment.  History of Present Illness    Brian Banks is a 77 y.o. male who presents via audio/video conferencing for a telehealth visit today.  Pt was last seen in cardiology clinic on 12/20/2021 by Dr. Kirke Corin.  At that time Brian Banks was doing well with no new cardiac complaints. The patient is now pending procedure as outlined above. Since his last visit, he reports doing well with no new cardiac complaints.   Past Medical History    Past Medical History:  Diagnosis Date   Benign neoplasm of large bowel    BPH with obstruction/lower urinary tract symptoms    Coronary artery disease    Dysphagia    Erectile dysfunction    GERD (gastroesophageal reflux disease)    Heartburn    Hepatic steatosis    History of stomach ulcers    HLD (hyperlipidemia)     Hypogonadism in male    Myocardial infarction (HCC) 07/2020   NSTEMI   Obesity    RBBB (right bundle branch block)    Vitamin D deficiency    Past Surgical History:  Procedure Laterality Date   ACHILLES TENDON REPAIR     APPENDECTOMY     CARDIAC CATHETERIZATION  07/2020   COLONOSCOPY N/A 11/05/2021   Procedure: COLONOSCOPY;  Surgeon: Jaynie Collins, DO;  Location: [facility] ENDOSCOPY;  Service: Gastroenterology;  Laterality: N/A;   COLONOSCOPY     2005, 2011, 2014, 2017   CORONARY ARTERY BYPASS GRAFT N/A 07/19/2020   Procedure: CORONARY ARTERY BYPASS GRAFTING (CABG) TIMES THREE ON PUMP USING LEFT INTERNAL MAMMARY ARTERY AND ENDOSCOPICALLY HARVESTED RIGHT GREATER SAPHENOUS VEIN;  Surgeon: Linden Dolin, MD;  Location: [facility] OR;  Service: Open Heart Surgery;  Laterality: N/A;   ENDOVEIN HARVEST OF GREATER SAPHENOUS VEIN Right 07/19/2020   Procedure: ENDOVEIN HARVEST OF GREATER SAPHENOUS VEIN;  Surgeon: Linden Dolin, MD;  Location: [facility] OR;  Service: Open Heart Surgery;  Laterality: Right;   LEFT HEART CATH AND CORONARY ANGIOGRAPHY N/A 07/18/2020   Procedure: LEFT HEART CATH AND CORONARY ANGIOGRAPHY;  Surgeon: Iran Ouch, MD;  Location: [facility] INVASIVE CV LAB;  Service: Cardiovascular;  Laterality: N/A;   TEE WITHOUT CARDIOVERSION N/A 07/19/2020   Procedure: TRANSESOPHAGEAL ECHOCARDIOGRAM (TEE);  Surgeon: Linden Dolin, MD;  Location: [facility] OR;  Service: Open Heart Surgery;  Laterality: N/A;   UPPER GASTROINTESTINAL ENDOSCOPY  2011, 2014    Allergies  Allergies  Allergen Reactions   Lovastatin     Reports statin intolerance/myalgias.  Tolerates Crestor 20 mg daily.   Quinolones     Contraindicated given aortic dilation   Androgel [Testosterone] Rash    Home Medications    Prior to Admission medications   Medication Sig Start Date End Date Taking? Authorizing Provider  aspirin EC 81 MG EC tablet Take 1 tablet (81 mg total) by mouth daily. Swallow whole.  07/24/20   Gold, Deniece Portela E, PA-C  finasteride (PROSCAR) 5 MG tablet TAKE 1 TABLET BY MOUTH DAILY 02/07/22   Michiel Cowboy A, PA-C  metoprolol succinate (TOPROL-XL) 25 MG 24 hr tablet TAKE 1 TABLET BY MOUTH DAILY 03/12/22   Iran Ouch, MD  pantoprazole (PROTONIX) 40 MG tablet TAKE ONE TABLET BY MOUTH DAILY 02/07/22   Iran Ouch, MD  rosuvastatin (CRESTOR) 20 MG tablet TAKE ONE TABLET BY MOUTH EVERY DAY 06/21/22   Iran Ouch, MD    Physical Exam    Vital Signs:  Brian Banks does not have vital signs available for review today.  Given telephonic nature of communication, physical exam is limited. AAOx3. NAD. Normal affect.  Speech and respirations are unlabored.  Accessory Clinical Findings    None  Assessment & Plan    1.  Preoperative Cardiovascular Risk Assessment:  Patient's RCRI score is 6.6%  The patient affirms he has been doing well without any new cardiac symptoms. They are able to achieve 6 METS without cardiac limitations. Therefore, based on ACC/AHA guidelines, the patient would be at acceptable risk for the planned procedure without further cardiovascular testing. The patient was advised that if he develops new symptoms prior to surgery to contact our office to arrange for a follow-up visit, and he verbalized understanding.   The patient was advised that if he develops new symptoms prior to surgery to contact our office to arrange for a follow-up visit, and he verbalized understanding.  Patient can hold aspirin 5 to 7 days prior to procedure and should restart postprocedure when surgically safe.  A copy of this note will be routed to requesting surgeon.  Time:   Today, I have spent 8 minutes with the patient with telehealth technology discussing medical history, symptoms, and management plan.     Napoleon Form, Leodis Rains, NP  08/08/2022, 6:59 AM

## 2022-08-08 NOTE — Progress Notes (Signed)
Perioperative / Anesthesia Services  Pre-Admission Testing Clinical Review / Preoperative Anesthesia Consult  Date: 08/08/22  Patient Demographics:  Name: Brian Banks DOB:   October 24, 1945 MRN:   [id number]  Planned Surgical Procedure(s):    Case: [id number] Date/Time: 08/14/22 0815   Procedure: LEFT LITTLE FINGER DUPUYTREN'S EXCISION (Left: Little Finger)   Anesthesia type: Choice   Pre-op diagnosis: DUPUYTREN'S CONTRACTURE   Location: [facility] OR ROOM 02 / [facility] ORS FOR ANESTHESIA GROUP   Surgeons: Christena Flake, MD     NOTE: Available PAT nursing documentation and vital signs have been reviewed. Clinical nursing staff has updated patient's PMH/PSHx, current medication list, and drug allergies/intolerances to ensure comprehensive history available to assist in medical decision making as it pertains to the aforementioned surgical procedure and anticipated anesthetic course. Extensive review of available clinical information personally performed. [facility] PMH and PSHx updated with any diagnoses/procedures that  may have been inadvertently omitted during his intake with the pre-admission testing department's nursing staff.  Clinical Discussion:  Brian Banks is a 77 y.o. male who is submitted for pre-surgical anesthesia review and clearance prior to him undergoing the above procedure. Patient is a Current Smoker. Pertinent PMH includes: CAD (s/p CABG), NSTEMI, RBBB, HTN, HLD, borderline diabetes, OSAH (does not utilize nocturnal PAP therapy), GERD (on daily PPI), dysphagia, gastric ulcer, hiatal hernia, OA, Dupuytren's contracture of LEFT hand, thoracolumbar DDD, BPH.  Patient is followed by cardiology Kirke Corin, MD). He was last seen in the cardiology clinic on 12/20/2021; notes reviewed. At the time of his clinic visit, patient doing well overall from a cardiovascular perspective. Patient denied any chest pain, shortness of breath, PND, orthopnea, palpitations, significant peripheral edema,  weakness, fatigue, vertiginous symptoms, or presyncope/syncope. Patient with a past medical history significant for cardiovascular diagnoses. Documented physical exam was grossly benign, providing no evidence of acute exacerbation and/or decompensation of the patient's known cardiovascular conditions.  Patient suffered an NSTEMI on 07/18/2020.  Troponins were trended: 20 --> 70 ng/L.  Diagnostic LEFT heart catheterization was performed revealing multivessel CAD; 40% ostial to distal LM, 95% distal LM to ostial LAD, 20% ostial to proximal LCx, 80% proximal LAD, 95% proximal to mid LAD, 70% LPDA, and 50% OM3.  Given the degree and complexity of his coronary artery disease, the decision was made to transfer patient to [facility] for consultation with CVTS regarding revascularization.  Patient underwent three-vessel revascularization procedure on 07/19/2020.  LIMA-LAD, SVG-LPDA, and SVG-OM2 bypass grafts were placed.  Procedure was complicated by blood loss anemia requiring transfusion.  Patient also with PVCs that required treatment with amiodarone.  Of note, amiodarone was discontinued prior to discharge.  Most recent TTE was performed on 11/15/2020 revealing a mildly reduced left ventricular systolic function with an EF of 45-50%.  There was global hypokinesis.  Left ventricle moderately to severely dilated.  Diastolic Doppler parameters normal.  Right ventricle mildly enlarged.  There was mild left atrial dilatation.  No valvular regurgitation.  All transvalvular gradients were noted to be normal providing no evidence suggestive of valvular stenosis.  Aorta normal in size with no evidence of aneurysmal dilatation.  Blood pressure well controlled at 100/60 mmHg on currently prescribed beta-blocker (metoprolol succinate) monotherapy.  Patient is on rosuvastatin for his HLD diagnosis and ASCVD prevention.  Patient has a borderline diabetes diagnosis; last HgbA1c was 6.7% when checked on 03/12/2022.   Patient does have an OSAH diagnosis, however he no longer uses his prescribed nocturnal PAP therapy. Patient noted that he  did not appreciate associated benefits from the PAP therapy, which is the reason he just stopped using his machine.  Patient continues to smoke 1 cigar/day; cessation was encouraged.  Patient does not have a formal exercise regimen.  With that said, per the DASI, patient still felt to be able to achieve at least 4 METS of physical activity without experiencing any significant degree of angina/anginal equivalent symptoms.  No changes were made to his medication regimen.  Patient to follow-up with outpatient cardiology in 1 year or sooner if needed.  Brian Banks is scheduled for an elective LEFT LITTLE FINGER DUPUYTREN'S EXCISION (Left: Little Finger) on 08/14/2022 with Dr. Leron Croak, MD.  Given patient's past medical history significant for cardiovascular diagnoses, presurgical cardiac clearance was sought by the PAT team.  Per cardiology, "the patient affirms he has been doing well without any new cardiac symptoms. They are able to achieve 6 METS without cardiac limitations. Therefore, based on ACC/AHA guidelines, the patient would be at ACCEPTABLE risk for the planned procedure without further cardiovascular testing".  In review of his medication reconciliation, it is noted that patient is currently on prescribed daily biotic therapy. He has been instructed on recommendations for holding his daily low-dose ASA for 7 days prior to his procedure with plans to restart as soon as postoperative bleeding risk felt to be minimized by his attending surgeon. The patient has been instructed that his last dose of his ASA should be on 08/06/2022.  Patient denies previous perioperative complications with anesthesia in the past. In review of the available records, it is noted that patient underwent a general anesthetic course ACCEPTABLE (ASA III) in 10/2021 without documented complications.       08/06/2022    4:25 PM 03/15/2022   10:32 AM 12/20/2021    8:01 AM  Vitals with BMI  Height 6\' 5"  6\' 5"  6\' 5"   Weight 280 lbs 280 lbs 287 lbs  BMI 33.2 33.2 34.03  Systolic  138 100  Diastolic  74 60  Pulse  73 68    Providers/Specialists:   NOTE: Primary physician provider listed below. Patient may have been seen by APP or partner within same practice.   PROVIDER ROLE / SPECIALTY LAST OV  Poggi, Excell Seltzer, MD Orthopedic's (Surgeon) 08/05/2022  Lynnea Ferrier, MD Primary Care Provider 04/03/2022  Lorine Bears, MD Cardiology 12/20/2021; update preop APP call on 08/08/2022   Allergies:  Lovastatin, Quinolones, and Androgel [testosterone]  Current Home Medications:   No current facility-administered medications for this encounter.    aspirin EC 81 MG EC tablet   finasteride (PROSCAR) 5 MG tablet   metoprolol succinate (TOPROL-XL) 25 MG 24 hr tablet   pantoprazole (PROTONIX) 40 MG tablet   rosuvastatin (CRESTOR) 20 MG tablet   History:   Past Medical History:  Diagnosis Date   Benign neoplasm of large bowel    Borderline diabetes    BPH with obstruction/lower urinary tract symptoms    Coronary artery disease 07/18/2020   a.) LHC 07/18/2020 (setting of NSTEMI): 40% o-dLM, 95% dLM-oLAD, 20% o-pLCx, 80% pLAD, 95% p-mLAD, 70% LPDA, 50% OM3; b.) s/p 3v CABG 07/19/2020   DDD (degenerative disc disease), thoracolumbar    Dupuytren's contracture of left hand    Dysphagia    Erectile dysfunction    GERD (gastroesophageal reflux disease)    Hepatic steatosis    Hiatal hernia    History of echocardiogram 11/15/2020   a.) TTE 11/15/2020: EF 45-50%, glob HK, mod-sev LV  dil, mild RVE, mild LAE   History of stomach ulcers    HLD (hyperlipidemia)    HTN (hypertension)    Hypogonadism in male    NSTEMI (non-ST elevated myocardial infarction) (HCC) 07/18/2020   a.) troponins trended: 20 --> 70 ng/L; b.) LHC 07/18/2020: 40% o-dLM, 95% dLM-oLAD, 20% o-pLCx, 80% pLAD, 95%  p-mLAD, 70% LPDA, 50% OM3 --> transfer to [facility] for CVTS consult; c.) 3v CABG 07/19/2020   Obesity    OSA (obstructive sleep apnea)    a.) does not utilize nocturnal PAP therapy   Osteoarthritis    RBBB (right bundle branch block)    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  Also had PVCs requiring IV amiodarone: Discontinued at discharge.   Vitamin D deficiency    Past Surgical History:  Procedure Laterality Date   ACHILLES TENDON REPAIR     APPENDECTOMY     CARDIAC CATHETERIZATION  07/2020   COLONOSCOPY N/A 11/05/2021   Procedure: COLONOSCOPY;  Surgeon: Jaynie Collins, DO;  Location: [facility] ENDOSCOPY;  Service: Gastroenterology;  Laterality: N/A;   COLONOSCOPY     2005, 2011, 2014, 2017   CORONARY ARTERY BYPASS GRAFT N/A 07/19/2020   Procedure: CORONARY ARTERY BYPASS GRAFTING (CABG) TIMES THREE ON PUMP USING LEFT INTERNAL MAMMARY ARTERY AND ENDOSCOPICALLY HARVESTED RIGHT GREATER SAPHENOUS VEIN;  Surgeon: Brian Dolin, MD;  Location: [facility] OR;  Service: Open Heart Surgery;  Laterality: N/A;   ENDOVEIN HARVEST OF GREATER SAPHENOUS VEIN Right 07/19/2020   Procedure: ENDOVEIN HARVEST OF GREATER SAPHENOUS VEIN;  Surgeon: Brian Dolin, MD;  Location: [facility] OR;  Service: Open Heart Surgery;  Laterality: Right;   LEFT HEART CATH AND CORONARY ANGIOGRAPHY N/A 07/18/2020   Procedure: LEFT HEART CATH AND CORONARY ANGIOGRAPHY;  Surgeon: Iran Ouch, MD;  Location: [facility] INVASIVE CV LAB;  Service: Cardiovascular;  Laterality: N/A;   TEE WITHOUT CARDIOVERSION N/A 07/19/2020   Procedure: TRANSESOPHAGEAL ECHOCARDIOGRAM (TEE);  Surgeon: Brian Dolin, MD;  Location: [facility] OR;  Service: Open Heart Surgery;  Laterality: N/A;   UPPER GASTROINTESTINAL ENDOSCOPY     2011, 2014   Family History  Problem Relation Age of Onset   Alzheimer's disease Father    Prostate cancer Brother    Diabetes Mellitus II  Mother    Stroke Mother    Social History   Tobacco Use   Smoking status: Some Days    Types: Cigars   Smokeless tobacco: Never  Vaping Use   Vaping Use: Never used  Substance Use Topics   Alcohol use: Not Currently    Comment: occassional   Drug use: No    Pertinent Clinical Results:  LABS:   Hospital Outpatient Visit on 08/07/2022  Component Date Value Ref Range Status   Sodium 08/07/2022 138  135 - 145 mmol/L Final   Potassium 08/07/2022 3.7  3.5 - 5.1 mmol/L Final   Chloride 08/07/2022 107  98 - 111 mmol/L Final   CO2 08/07/2022 21 (L)  22 - 32 mmol/L Final   Glucose, Bld 08/07/2022 220 (H)  70 - 99 mg/dL Final   Glucose reference range applies only to samples taken after fasting for at least 8 hours.   BUN 08/07/2022 20  8 - 23 mg/dL Final   Creatinine, Ser 08/07/2022 0.99  0.61 - 1.24 mg/dL Final   Calcium 16/11/9602 8.3 (L)  8.9 - 10.3 mg/dL Final   GFR, Estimated 08/07/2022 >  60  >60 mL/min Final   Comment: (NOTE) Calculated using the CKD-EPI Creatinine Equation (2021)    Anion gap 08/07/2022 10  5 - 15 Final   Performed at [facility], [address] [place], [place] [address]   WBC 08/07/2022 6.5  4.0 - 10.5 K/uL Final   RBC 08/07/2022 4.43  4.22 - 5.81 MIL/uL Final   Hemoglobin 08/07/2022 12.9 (L)  13.0 - 17.0 g/dL Final   HCT 95/28/4132 39.2  39.0 - 52.0 % Final   MCV 08/07/2022 88.5  80.0 - 100.0 fL Final   MCH 08/07/2022 29.1  26.0 - 34.0 pg Final   MCHC 08/07/2022 32.9  30.0 - 36.0 g/dL Final   RDW 44/02/270 13.3  11.5 - 15.5 % Final   Platelets 08/07/2022 222  150 - 400 K/uL Final   nRBC 08/07/2022 0.0  0.0 - 0.2 % Final   Performed at [facility], [address] [place], [place] [address]    ECG: Date: 08/07/2022 Time ECG obtained: 0843 AM Rate: 65 bpm Rhythm:  Sinus rhythm with sinus arrhythmia with first-degree AV block; RBBB Axis (leads I and aVF): Normal Intervals: PR 244 ms. QRS 156 ms. QTc 484 ms. ST segment and T  wave changes: No evidence of acute ST segment elevation or depression.  Cannot rule out previous/old inferior infarct Comparison: Similar to previous tracing obtained on 07/20/2020   IMAGING / PROCEDURES: CT ANGIO CHEST AORTA W/CM & OR WO/CM performed on 04/17/2022 No acute vascular abnormality within the chest. 3.5 cm ascending thoracic aorta.  No CTA evidence of thoracic aortic aneurysmal dilatation. Coronary bypass changes with severe calcific atherosclerosis of the native coronary arteries Small hiatal hernia Small volume LEFT pleural effusion persistent since at least 08/01/2020. Solitary 3 mm RIGHT upper lobe solid pulmonary nodule Mild degenerative changes of the imaged thoracolumbar spine with the greatest being at the T12-L1 level  TRANSTHORACIC ECHOCARDIOGRAM performed on 11/15/2020 Left ventricular ejection fraction, by estimation, is 45 to 50%. The left ventricle has mildly decreased function. The left ventricle demonstrates global hypokinesis. The left ventricular internal cavity size was moderately to severely dilated. Left ventricular diastolic parameters were normal.  Right ventricular systolic function is normal. The right ventricular size is mildly enlarged.  Left atrial size was mildly dilated.  The mitral valve is normal in structure. No evidence of mitral valve regurgitation.  The aortic valve is tricuspid. Aortic valve regurgitation is not visualized.   CORONARY ARTERY BYPASS GRAFTING performed on 07/19/2020 Three-vessel revascularization procedure LIMA-LAD SVG-LPDA SVG-OM2  LEFT HEART CATHETERIZATION AND CORONARY ANGIOGRAPHY performed on 07/18/2020 Normal left ventricular systolic function with an EF of 55-65% Mildly elevated LVEDP Multivessel CAD 40% ostial LM to distal LM 95% distal LM to ostial LAD 20% ostial to proximal LCx 80% proximal LAD 95% proximal to mid LAD 70% LPDA 50% OM3 Recommendations Ostial/proximal LAD moderately to severely calcified.   Lesions not amenable to PCI.  The takeoff from the left main does not allow stenting of the LAD without jailing the large dominant LCx. Best option is revascularization.  Transfer to [facility].   Impression and Plan:  Brian Banks has been referred for pre-anesthesia review and clearance prior to him undergoing the planned anesthetic and procedural courses. Available labs, pertinent testing, and imaging results were personally reviewed by me in preparation for upcoming operative/procedural course. [facility] medical record has been updated following extensive record review and patient interview with PAT staff.   This patient has  been appropriately cleared by cardiology with an overall ACCEPTABLE risk of experiencing significant perioperative cardiovascular complications. Based on clinical review performed today (08/08/22), barring any significant acute changes in the patient's overall condition, it is anticipated that he will be able to proceed with the planned surgical intervention. Any acute changes in clinical condition may necessitate his procedure being postponed and/or cancelled. Patient will meet with anesthesia team (MD and/or CRNA) on the day of his procedure for preoperative evaluation/assessment. Questions regarding anesthetic course will be fielded at that time.   Pre-surgical instructions were reviewed with the patient during his PAT appointment, and questions were fielded to satisfaction by PAT clinical staff. He has been instructed on which medications that he will need to hold prior to surgery, as well as the ones that have been deemed safe/appropriate to take on the day of his procedure. As part of the general education provided by PAT, patient made aware both verbally and in writing, that he would need to abstain from the use of any illegal substances during his perioperative course.  He was advised that failure to follow the provided instructions could necessitate case  cancellation or result in serious perioperative complications up to and including death. Patient encouraged to contact PAT and/or his surgeon's office to discuss any questions or concerns that may arise prior to surgery; verbalized understanding.   Quentin Mulling, MSN, APRN, FNP-C, CEN [facility]  Peri-operative Services Nurse Practitioner Phone: [phone] Fax: [phone] 08/08/22 1:21 PM  NOTE: This note has been prepared using Scientist, clinical (histocompatibility and immunogenetics). Despite my best ability to proofread, there is always the potential that unintentional transcriptional errors may still occur from this process.

## 2022-08-13 MED ORDER — ORAL CARE MOUTH RINSE: 15.0000 mL | Freq: Once | OROMUCOSAL | Status: AC

## 2022-08-13 MED ORDER — CHLORHEXIDINE GLUCONATE 0.12 % MT SOLN
15.0000 mL | Freq: Once | OROMUCOSAL | Status: AC
  Administered 2022-08-14: 15 mL via OROMUCOSAL

## 2022-08-13 MED ORDER — CEFAZOLIN IN SODIUM CHLORIDE 3-0.9 GM/100ML-% IV SOLN
3.0000 g | INTRAVENOUS | Status: DC
  Filled 2022-08-13: qty 100

## 2022-08-13 MED ORDER — DEXTROSE 5 % IV SOLN
3.0000 g | Freq: Once | INTRAVENOUS | Status: AC
  Administered 2022-08-14: 3 g via INTRAVENOUS
  Filled 2022-08-13: qty 3

## 2022-08-13 MED ORDER — LACTATED RINGERS IV SOLN: INTRAVENOUS | Status: DC

## 2022-08-14 ENCOUNTER — Ambulatory Visit
Admission: RE | Admit: 2022-08-14 | Discharge: 2022-08-14 | Disposition: A | Discharge status: Home / Self Care | Payer: PPO | Attending: Surgery | Admitting: Surgery

## 2022-08-14 ENCOUNTER — Ambulatory Visit: Payer: PPO | Admitting: Urgent Care

## 2022-08-14 ENCOUNTER — Other Ambulatory Visit: Payer: Self-pay

## 2022-08-14 ENCOUNTER — Encounter: Payer: Self-pay | Admitting: Surgery

## 2022-08-14 ENCOUNTER — Encounter
Admission: RE | Disposition: A | Discharge status: Home / Self Care | Payer: Self-pay | Source: Home / Self Care | Attending: Surgery

## 2022-08-14 DIAGNOSIS — N138 Other obstructive and reflux uropathy: Secondary | ICD-10-CM | POA: Diagnosis not present

## 2022-08-14 DIAGNOSIS — M720 Palmar fascial fibromatosis [Dupuytren]: Secondary | ICD-10-CM | POA: Insufficient documentation

## 2022-08-14 DIAGNOSIS — I10 Essential (primary) hypertension: Secondary | ICD-10-CM | POA: Diagnosis not present

## 2022-08-14 DIAGNOSIS — I252 Old myocardial infarction: Secondary | ICD-10-CM | POA: Insufficient documentation

## 2022-08-14 DIAGNOSIS — Z6833 Body mass index (BMI) 33.0-33.9, adult: Secondary | ICD-10-CM | POA: Insufficient documentation

## 2022-08-14 DIAGNOSIS — G4733 Obstructive sleep apnea (adult) (pediatric): Secondary | ICD-10-CM | POA: Diagnosis not present

## 2022-08-14 DIAGNOSIS — R7303 Prediabetes: Secondary | ICD-10-CM | POA: Diagnosis not present

## 2022-08-14 DIAGNOSIS — I2510 Atherosclerotic heart disease of native coronary artery without angina pectoris: Secondary | ICD-10-CM | POA: Insufficient documentation

## 2022-08-14 DIAGNOSIS — E669 Obesity, unspecified: Secondary | ICD-10-CM | POA: Insufficient documentation

## 2022-08-14 DIAGNOSIS — K219 Gastro-esophageal reflux disease without esophagitis: Secondary | ICD-10-CM | POA: Diagnosis not present

## 2022-08-14 DIAGNOSIS — E785 Hyperlipidemia, unspecified: Secondary | ICD-10-CM | POA: Insufficient documentation

## 2022-08-14 DIAGNOSIS — F17290 Nicotine dependence, other tobacco product, uncomplicated: Secondary | ICD-10-CM | POA: Diagnosis not present

## 2022-08-14 DIAGNOSIS — Z951 Presence of aortocoronary bypass graft: Secondary | ICD-10-CM | POA: Diagnosis not present

## 2022-08-14 DIAGNOSIS — Z79899 Other long term (current) drug therapy: Secondary | ICD-10-CM | POA: Insufficient documentation

## 2022-08-14 DIAGNOSIS — K449 Diaphragmatic hernia without obstruction or gangrene: Secondary | ICD-10-CM | POA: Insufficient documentation

## 2022-08-14 DIAGNOSIS — N401 Enlarged prostate with lower urinary tract symptoms: Secondary | ICD-10-CM | POA: Insufficient documentation

## 2022-08-14 DIAGNOSIS — F17200 Nicotine dependence, unspecified, uncomplicated: Secondary | ICD-10-CM | POA: Diagnosis not present

## 2022-08-14 DIAGNOSIS — I214 Non-ST elevation (NSTEMI) myocardial infarction: Secondary | ICD-10-CM | POA: Diagnosis not present

## 2022-08-14 HISTORY — DX: Unspecified osteoarthritis, unspecified site: M19.90

## 2022-08-14 HISTORY — PX: DUPUYTREN CONTRACTURE RELEASE: SHX1478

## 2022-08-14 HISTORY — DX: Palmar fascial fibromatosis (dupuytren): M72.0

## 2022-08-14 HISTORY — DX: Obstructive sleep apnea (adult) (pediatric): G47.33

## 2022-08-14 HISTORY — DX: Other intervertebral disc degeneration, thoracolumbar region: M51.35

## 2022-08-14 HISTORY — DX: Diaphragmatic hernia without obstruction or gangrene: K44.9

## 2022-08-14 HISTORY — DX: Essential (primary) hypertension: I10

## 2022-08-14 HISTORY — DX: Prediabetes: R73.03

## 2022-08-14 LAB — GLUCOSE, CAPILLARY: Glucose-Capillary: 123 mg/dL — ABNORMAL HIGH (ref 70–99)

## 2022-08-14 SURGERY — RELEASE, DUPUYTREN CONTRACTURE
Anesthesia: General | Site: Little Finger | Laterality: Left

## 2022-08-14 MED ORDER — OXYCODONE HCL 5 MG PO TABS: 5.0000 mg | ORAL_TABLET | Freq: Once | ORAL | Status: DC | PRN

## 2022-08-14 MED ORDER — 0.9 % SODIUM CHLORIDE (POUR BTL) OPTIME
TOPICAL | Status: DC | PRN
  Administered 2022-08-14: 500 mL

## 2022-08-14 MED ORDER — METOCLOPRAMIDE HCL 5 MG/ML IJ SOLN: 5.0000 mg | Freq: Three times a day (TID) | INTRAMUSCULAR | Status: DC | PRN

## 2022-08-14 MED ORDER — HYDROCODONE-ACETAMINOPHEN 5-325 MG PO TABS: 1.0000 | ORAL_TABLET | ORAL | Status: DC | PRN

## 2022-08-14 MED ORDER — FENTANYL CITRATE (PF) 100 MCG/2ML IJ SOLN
INTRAMUSCULAR | Status: DC | PRN
  Administered 2022-08-14 (×2): 25 ug via INTRAVENOUS
  Administered 2022-08-14: 50 ug via INTRAVENOUS

## 2022-08-14 MED ORDER — PROPOFOL 1000 MG/100ML IV EMUL
INTRAVENOUS | Status: AC
  Filled 2022-08-14: qty 100

## 2022-08-14 MED ORDER — PROPOFOL 10 MG/ML IV BOLUS
INTRAVENOUS | Status: AC
  Filled 2022-08-14: qty 20

## 2022-08-14 MED ORDER — SODIUM CHLORIDE 0.9 % IV SOLN: INTRAVENOUS | Status: DC

## 2022-08-14 MED ORDER — KETOROLAC TROMETHAMINE 30 MG/ML IJ SOLN
INTRAMUSCULAR | Status: DC | PRN
  Administered 2022-08-14: 30 mg via INTRAVENOUS

## 2022-08-14 MED ORDER — ONDANSETRON HCL 4 MG/2ML IJ SOLN: 4.0000 mg | Freq: Four times a day (QID) | INTRAMUSCULAR | Status: DC | PRN

## 2022-08-14 MED ORDER — ACETAMINOPHEN 10 MG/ML IV SOLN
INTRAVENOUS | Status: AC
  Filled 2022-08-14: qty 100

## 2022-08-14 MED ORDER — BUPIVACAINE HCL (PF) 0.5 % IJ SOLN
INTRAMUSCULAR | Status: AC
  Filled 2022-08-14: qty 30

## 2022-08-14 MED ORDER — EPHEDRINE SULFATE (PRESSORS) 50 MG/ML IJ SOLN
INTRAMUSCULAR | Status: DC | PRN
  Administered 2022-08-14: 10 mg via INTRAVENOUS

## 2022-08-14 MED ORDER — PHENYLEPHRINE HCL (PRESSORS) 10 MG/ML IV SOLN
INTRAVENOUS | Status: DC | PRN
  Administered 2022-08-14 (×2): 80 ug via INTRAVENOUS

## 2022-08-14 MED ORDER — ONDANSETRON HCL 4 MG/2ML IJ SOLN
INTRAMUSCULAR | Status: DC | PRN
  Administered 2022-08-14: 4 mg via INTRAVENOUS

## 2022-08-14 MED ORDER — PHENYLEPHRINE HCL-NACL 20-0.9 MG/250ML-% IV SOLN
INTRAVENOUS | Status: AC
  Filled 2022-08-14: qty 250

## 2022-08-14 MED ORDER — LIDOCAINE HCL (CARDIAC) PF 100 MG/5ML IV SOSY
PREFILLED_SYRINGE | INTRAVENOUS | Status: DC | PRN
  Administered 2022-08-14: 100 mg via INTRAVENOUS

## 2022-08-14 MED ORDER — KETOROLAC TROMETHAMINE 15 MG/ML IJ SOLN: 15.0000 mg | Freq: Once | INTRAMUSCULAR | Status: DC

## 2022-08-14 MED ORDER — OXYCODONE HCL 5 MG/5ML PO SOLN: 5.0000 mg | Freq: Once | ORAL | Status: DC | PRN

## 2022-08-14 MED ORDER — ONDANSETRON HCL 4 MG PO TABS: 4.0000 mg | ORAL_TABLET | Freq: Four times a day (QID) | ORAL | Status: DC | PRN

## 2022-08-14 MED ORDER — DEXAMETHASONE SODIUM PHOSPHATE 10 MG/ML IJ SOLN
INTRAMUSCULAR | Status: AC
  Filled 2022-08-14: qty 1

## 2022-08-14 MED ORDER — DEXAMETHASONE SODIUM PHOSPHATE 10 MG/ML IJ SOLN
INTRAMUSCULAR | Status: DC | PRN
  Administered 2022-08-14: 5 mg via INTRAVENOUS

## 2022-08-14 MED ORDER — CHLORHEXIDINE GLUCONATE 0.12 % MT SOLN
OROMUCOSAL | Status: AC
  Filled 2022-08-14: qty 15

## 2022-08-14 MED ORDER — LIDOCAINE HCL (PF) 2 % IJ SOLN
INTRAMUSCULAR | Status: AC
  Filled 2022-08-14: qty 5

## 2022-08-14 MED ORDER — HYDROCODONE-ACETAMINOPHEN 5-325 MG PO TABS: 1.0000 | ORAL_TABLET | Freq: Four times a day (QID) | ORAL | 0 refills | Status: DC | PRN

## 2022-08-14 MED ORDER — PROPOFOL 10 MG/ML IV BOLUS
INTRAVENOUS | Status: DC | PRN
  Administered 2022-08-14: 150 mg via INTRAVENOUS
  Administered 2022-08-14: 50 mg via INTRAVENOUS

## 2022-08-14 MED ORDER — METOCLOPRAMIDE HCL 10 MG PO TABS: 5.0000 mg | ORAL_TABLET | Freq: Three times a day (TID) | ORAL | Status: DC | PRN

## 2022-08-14 MED ORDER — ONDANSETRON HCL 4 MG/2ML IJ SOLN
INTRAMUSCULAR | Status: AC
  Filled 2022-08-14: qty 2

## 2022-08-14 MED ORDER — ACETAMINOPHEN 10 MG/ML IV SOLN
INTRAVENOUS | Status: DC | PRN
  Administered 2022-08-14: 1000 mg via INTRAVENOUS

## 2022-08-14 MED ORDER — FENTANYL CITRATE (PF) 100 MCG/2ML IJ SOLN
INTRAMUSCULAR | Status: AC
  Filled 2022-08-14: qty 2

## 2022-08-14 MED ORDER — ACETAMINOPHEN 325 MG PO TABS: 325.0000 mg | ORAL_TABLET | Freq: Four times a day (QID) | ORAL | Status: DC | PRN

## 2022-08-14 MED ORDER — FENTANYL CITRATE (PF) 100 MCG/2ML IJ SOLN: 25.0000 ug | INTRAMUSCULAR | Status: DC | PRN

## 2022-08-14 MED ORDER — BUPIVACAINE HCL (PF) 0.5 % IJ SOLN
INTRAMUSCULAR | Status: DC | PRN
  Administered 2022-08-14: 10 mL

## 2022-08-14 SURGICAL SUPPLY — 40 items
APL PRP STRL LF DISP 70% ISPRP (MISCELLANEOUS) ×1
BNDG CMPR 5X2 KNTD ELC UNQ LF (GAUZE/BANDAGES/DRESSINGS) ×1
BNDG CMPR 5X3 KNIT ELC UNQ LF (GAUZE/BANDAGES/DRESSINGS) ×1
BNDG CMPR 5X4 CHSV STRCH STRL (GAUZE/BANDAGES/DRESSINGS) ×1
BNDG CMPR 75X21 PLY HI ABS (MISCELLANEOUS) ×1
BNDG COHESIVE 4X5 TAN STRL LF (GAUZE/BANDAGES/DRESSINGS) ×1 IMPLANT
BNDG ELASTIC 2INX 5YD STR LF (GAUZE/BANDAGES/DRESSINGS) ×1 IMPLANT
BNDG ELASTIC 3INX 5YD STR LF (GAUZE/BANDAGES/DRESSINGS) ×1 IMPLANT
BNDG ESMARCH 4 X 12 STRL LF (GAUZE/BANDAGES/DRESSINGS) ×1
BNDG ESMARCH 4X12 STRL LF (GAUZE/BANDAGES/DRESSINGS) ×1 IMPLANT
CHLORAPREP W/TINT 26 (MISCELLANEOUS) ×1 IMPLANT
CORD BIP STRL DISP 12FT (MISCELLANEOUS) ×1 IMPLANT
CUFF TOURN SGL QUICK 18X4 (TOURNIQUET CUFF) ×1 IMPLANT
DRAPE SURG 17X11 SM STRL (DRAPES) ×1 IMPLANT
ELECT REM PT RETURN 9FT ADLT (ELECTROSURGICAL) ×1
ELECTRODE REM PT RTRN 9FT ADLT (ELECTROSURGICAL) ×1 IMPLANT
FORCEPS JEWEL BIP 4-3/4 STR (INSTRUMENTS) ×1 IMPLANT
GAUZE SPONGE 2X2 STRL 8-PLY (GAUZE/BANDAGES/DRESSINGS) ×1 IMPLANT
GAUZE SPONGE 4X4 12PLY STRL (GAUZE/BANDAGES/DRESSINGS) ×1 IMPLANT
GAUZE STRETCH 2X75IN STRL (MISCELLANEOUS) ×1 IMPLANT
GAUZE XEROFORM 1X8 LF (GAUZE/BANDAGES/DRESSINGS) ×1 IMPLANT
GLOVE BIO SURGEON STRL SZ8 (GLOVE) ×2 IMPLANT
GLOVE INDICATOR 8.0 STRL GRN (GLOVE) ×1 IMPLANT
GOWN STRL REUS W/ TWL LRG LVL3 (GOWN DISPOSABLE) ×1 IMPLANT
GOWN STRL REUS W/ TWL XL LVL3 (GOWN DISPOSABLE) ×1 IMPLANT
GOWN STRL REUS W/TWL LRG LVL3 (GOWN DISPOSABLE) ×1
GOWN STRL REUS W/TWL XL LVL3 (GOWN DISPOSABLE) ×1
KIT TURNOVER KIT A (KITS) ×1 IMPLANT
MANIFOLD NEPTUNE II (INSTRUMENTS) ×1 IMPLANT
NS IRRIG 1000ML POUR BTL (IV SOLUTION) ×1 IMPLANT
NS IRRIG 500ML POUR BTL (IV SOLUTION) ×1 IMPLANT
PACK EXTREMITY ARMC (MISCELLANEOUS) ×1 IMPLANT
PAD PREP OB/GYN DISP 24X41 (PERSONAL CARE ITEMS) ×1 IMPLANT
SPLINT CAST 1 STEP 4X15 (MISCELLANEOUS) IMPLANT
STOCKINETTE IMPERVIOUS 9X36 MD (GAUZE/BANDAGES/DRESSINGS) ×1 IMPLANT
SUT PROLENE 4 0 PS 2 18 (SUTURE) ×1 IMPLANT
SUT VIC AB 3-0 SH 27 (SUTURE) ×1
SUT VIC AB 3-0 SH 27X BRD (SUTURE) ×1 IMPLANT
TRAP FLUID SMOKE EVACUATOR (MISCELLANEOUS) ×1 IMPLANT
WATER STERILE IRR 500ML POUR (IV SOLUTION) ×1 IMPLANT

## 2022-08-14 NOTE — Discharge Instructions (Addendum)
Orthopedic discharge instructions: Keep splint dry and intact. Keep hand elevated above heart level. Apply ice to affected area frequently. Take ibuprofen 600-800 mg TID with meals for 3-5 days, then as necessary. Take pain medication as prescribed or ES Tylenol when needed.  May resume baby aspirin tomorrow morning. Return for follow-up in 10-14 days or as scheduled.  AMBULATORY SURGERY  DISCHARGE INSTRUCTIONS   The drugs that you were given will stay in your system until tomorrow so for the next 24 hours you should not:  Drive an automobile Make any legal decisions Drink any alcoholic beverage   You may resume regular meals tomorrow.  Today it is better to start with liquids and gradually work up to solid foods.  You may eat anything you prefer, but it is better to start with liquids, then soup and crackers, and gradually work up to solid foods.   Please notify your doctor immediately if you have any unusual bleeding, trouble breathing, redness and pain at the surgery site, drainage, fever, or pain not relieved by medication.    Additional Instructions:   Please contact your physician with any problems or Same Day Surgery at [phone], Monday through Friday 6 am to 4 pm, or [place] [facility] number at [phone].

## 2022-08-14 NOTE — H&P (Signed)
History of Present Illness: Brian Banks is a 77 y.o. who presents today for a history and physical. He is to undergo surgery of the left hand for Dupuytren contraction. This involves making the finger. Since his last visit at the clinic there have been no change in his condition. Patient presents his desire to proceed with surgery.  Patient states that he noticed some nodule formation to the palm of the hand for quite some time but it within the last year he has had drawing/flexing of the little finger to the point that is interfering with his activities of daily living. He plays guitar and subsequently this is starting to be an issue. Denies any numbness, tingling or burning sensation. Knows no injuries or change of activities which may have been precipitated this onset. However he was a Animator prior to retirement.  Past Medical History: Achilles tendon rupture  Arthritis  BPH (benign prostatic hyperplasia) 07/12/2013  Coronary artery disease involving native coronary artery of native heart without angina pectoris 08/07/2020  Acute MI 2022; s/p CABG 6/22; Left Internal Mammary Artery to Distal Left Anterior Descending Coronary Artery; Saphenous Vein Graft to left posterior Descending Coronary Artery; Saphenous Vein Graft to second obtuse Marginal Branch of Left Circumflex Coronary Artery. Endoscopic Vein Harvest from right thigh and Lower Leg  Dilatation of thoracic aorta (CMS-HCC) 09/05/2020 (3.7 cm 6/22; followed by Cardiology)  ED (erectile dysfunction)  GERD (gastroesophageal reflux disease) 04/15/2017  Helicobacter pylori (H. 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   Past Surgical History: COLONOSCOPY 02/01/2016 (PHx adenomatous polyps/FHx colon cancer-Brothers/Repeat 93yrs/RTE)  Colon @ [facility] 11/05/2021 ((16) Tubular adenomas/PHx CP/FHx CC/Repeat 36yr/SMR)  ACHILLES TENDON REPAIR   COLONOSCOPY 04/05/2003,11/17/2009,01/28/2013 (Adenomatous Polyps, FHCC (Brother), FH Colon Polyps (Mother, Brother, Sister): [facility] 01/2016)  COLONOSCOPY  Eardrum implant Left  EGD 11/17/2009,01/28/2013 (Hx Eosinophilic Esophagitis: No repeat per RTE)  UPPER GASTROINTESTINAL ENDOSCOPY   Past Family History:  Diabetes type II Mother  Colon polyps Mother  Alzheimer's disease Father  Colon polyps Sister  Prostate cancer Brother  Hepatitis Brother  Colon polyps Brother   Medications Current Outpatient Medications  Medication Sig Dispense Refill  aspirin 81 MG EC tablet Take 1 tablet (81 mg total) by mouth once daily  finasteride (PROSCAR) 5 mg tablet Take 1 tablet (5 mg total) by mouth once daily  metoprolol succinate (TOPROL-XL) 25 MG XL tablet Take 1 tablet (25 mg total) by mouth once daily  pantoprazole (PROTONIX) 40 MG DR tablet Take 40 mg by mouth once daily  rosuvastatin (CRESTOR) 20 MG tablet Take 1 tablet (20 mg total) by mouth once daily   Allergies:  Lovastatin Other (Intolerance)  Quinolones Other (Contraindicated given aortic dilation) Testosterone (Rash)   Review of Systems: A comprehensive 14 point ROS was performed, reviewed, and the pertinent orthopaedic findings are documented in the HPI.  Physical Exam: BP 126/70 (BP Location: Left upper arm, Patient Position: Sitting, BP Cuff Size: Large Adult)  Ht 195.6 cm (6\' 5" )  Wt (!) 127.2 kg (280 lb 6.4 oz)  BMI 33.25 kg/m   General: Well-developed well-nourished male seen in no acute distress.   HEENT: Atraumatic,normocephalic. Pupils are equal and reactive to light. Oropharynx is clear with moist mucosa  Lungs: Clear to auscultation bilaterally   Cardiovascular: Regular rate and rhythm. Normal S1, S2. No murmurs. No appreciable gallops or rubs. Peripheral pulses are palpable.  Abdomen: Soft, non-tender, nondistended. Bowel sounds present  Left  Hand Exam: Patient is noted to have fixed Dupuytren contraction to  the fifth finger of left hand. He has approximately 50 degrees flexion contracture at the PIP joint.  Neurological: The patient is alert and oriented Sensation to light touch appears to be intact and within normal limits Gross motor strength appeared to be equal to 5/5  Vascular : Peripheral pulses felt to be palpable. Capillary refill appears to be intact and within normal limits  Impression: 1. 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 He would like to proceed with surgical intervention to include an excision of the Dupuytren's contracture involving the right little finger. The risks (including bleeding, infection, nerve and/or blood vessel injury, persistent or recurrent pain, recurrence of the Dupuytren's contracture, residual stiffness of the little finger, weakness of grip, need for further surgery, blood clots, strokes, heart attacks or arrhythmias, pneumonia, etc.) and benefits of the surgical procedure were discussed. The patient states his understanding and agrees to proceed. A formal written consent will be obtained by the nursing staff.    H&P reviewed and patient re-examined. No changes.

## 2022-08-14 NOTE — Transfer of Care (Signed)
Immediate Anesthesia Transfer of Care Note  Patient: Brian Banks  Procedure(s) Performed: LEFT LITTLE FINGER DUPUYTREN'S EXCISION (Left: Little Finger)  Patient Location: PACU  Anesthesia Type:General  Level of Consciousness: drowsy  Airway & Oxygen Therapy: Patient Spontanous Breathing and Patient connected to face mask oxygen  Post-op Assessment: Report given to RN and Post -op Vital signs reviewed and stable  Post vital signs: Reviewed and stable  Last Vitals:  Vitals Value Taken Time  BP 125/71 08/14/22 1002  Temp 35.9 1002  Pulse 68 08/14/22 1004  Resp 14 08/14/22 1004  SpO2 98 % 08/14/22 1004  Vitals shown include unvalidated device data.  Last Pain:  Vitals:   08/14/22 0724  TempSrc: Temporal  PainSc: 0-No pain         Complications: No notable events documented.

## 2022-08-14 NOTE — Op Note (Signed)
08/14/2022  10:07 AM  Patient:   Brian Banks  Pre-Op Diagnosis:   Dupuytren's contracture, left little finger.  Post-Op Diagnosis:   Same.  Procedure:   Release of Dupuytren's contracture, left little finger.  Surgeon:   Maryagnes Amos, MD  Assistant:   None  Anesthesia:   General LMA  Findings:   As above.  Complications:   None  EBL:   0 cc  Fluids:   700 cc crystalloid  TT:   60 minutes at 250 mmHg  Drains:   None  Closure:   4-0 Prolene interrupted sutures  Brief Clinical Note:   The patient is a 77 year old male with a long history of a gradually worsening flexion contracture of his left little finger. The patient's history and examination are consistent with a Dupuytren's contracture of the left little finger. The patient presents at this time for release of the Dupuytren's contracture of the left little finger.  Procedure:   The patient was brought into the operating room and lain in the supine position. After adequate general laryngeal mask anesthesia was achieved, the left hand and upper extremity were prepped with ChloraPrep solution before being draped sterilely. Preoperative antibiotics were administered. A timeout was performed to verify the appropriate surgical site.    A Brunner type zigzag incision was made along the volar aspect of the left little finger beginning just proximal to the MCP flexion crease and extending beyond the PIP flexion crease. The incision was carried down through subcutaneous tissues. The fibrous cord was identified and carefully dissected out from proximal to distal after releasing it proximally. As dissection was carried out, care was taken to identify and protect the underlying flexor tendon, as well as the digital neurovascular bundles on either side of the left little finger. After the mass was removed in its entirety, the adequacy of excision was verified by palpation as well as visually. After excision of the Dupuytren's tissue,  the little finger finger MCP joint could be extended fully, but the PIP joint still exhibited an approximately 25 to 30 degree flexion contracture. Therefore the PIP joint was manipulated in order to release the volar plate. Subsequently, the little finger PIP joint could be extended to within 10 degrees of full extension which was felt to be sufficient, especially given his residual flexion contractures of his other PIP joints on the left hand.  The wound was copiously irrigated with sterile saline solution before the skin was reapproximated using 4-0 Prolene interrupted sutures. A total of 10 cc of 0.5% plain Sensorcaine was injected in and around the incision to help with postoperative analgesia before a sterile bulky dressing and volar splint extending to the fingertips was applied, maintaining the MCP joints in extension. The patient was then awakened, extubated, and returned to the recovery room in satisfactory condition after tolerating the procedure well.

## 2022-08-14 NOTE — Anesthesia Procedure Notes (Signed)
Procedure Name: LMA Insertion Date/Time: 08/14/2022 8:34 AM  Performed by: Morene Crocker, CRNAPre-anesthesia Checklist: Patient identified, Patient being monitored, Timeout performed, Emergency Drugs available and Suction available Patient Re-evaluated:Patient Re-evaluated prior to induction Oxygen Delivery Method: Circle system utilized Preoxygenation: Pre-oxygenation with 100% oxygen Induction Type: IV induction Ventilation: Mask ventilation without difficulty LMA: LMA inserted LMA Size: 5.0 Tube type: Oral Number of attempts: 1 Placement Confirmation: positive ETCO2 and breath sounds checked- equal and bilateral Tube secured with: Tape Dental Injury: Teeth and Oropharynx as per pre-operative assessment  Comments: Smooth, atraumatic LMA placement

## 2022-08-14 NOTE — Anesthesia Postprocedure Evaluation (Signed)
Anesthesia Post Note  Patient: MARKLEY MUJICA  Procedure(s) Performed: LEFT LITTLE FINGER DUPUYTREN'S EXCISION (Left: Little Finger)  Patient location during evaluation: PACU Anesthesia Type: General Level of consciousness: awake and alert Pain management: pain level controlled Vital Signs Assessment: post-procedure vital signs reviewed and stable Respiratory status: spontaneous breathing, nonlabored ventilation, respiratory function stable and patient connected to nasal cannula oxygen Cardiovascular status: blood pressure returned to baseline and stable Postop Assessment: no apparent nausea or vomiting Anesthetic complications: no   No notable events documented.   Last Vitals:  Vitals:   08/14/22 1016 08/14/22 1024  BP: 119/64   Pulse: 66 (!) 58  Resp: 13 14  Temp:    SpO2: 100% 93%    Last Pain:  Vitals:   08/14/22 1016  TempSrc:   PainSc: 0-No pain                 Louie Boston

## 2022-08-14 NOTE — Anesthesia Preprocedure Evaluation (Signed)
Anesthesia Evaluation  Patient identified by MRN, date of birth, ID band Patient awake    Reviewed: Allergy & Precautions, NPO status , Patient's Chart, lab work & pertinent test results  History of Anesthesia Complications Negative for: history of anesthetic complications  Airway Mallampati: III  TM Distance: >3 FB Neck ROM: full    Dental no notable dental hx.    Pulmonary sleep apnea , Current Smoker   Pulmonary exam normal        Cardiovascular Exercise Tolerance: Good hypertension, On Home Beta Blockers and On Medications (-) angina + CAD, + Past MI and + CABG  (-) DOE Normal cardiovascular exam+ dysrhythmias   Most recent TTE was performed on 11/15/2020 revealing a mildly reduced left ventricular systolic function with an EF of 45-50%.  There was global hypokinesis.  Left ventricle moderately to severely dilated.  Diastolic Doppler parameters normal.  Right ventricle mildly enlarged.  There was mild left atrial dilatation.  No valvular regurgitation.  All transvalvular gradients were noted to be normal providing no evidence suggestive of valvular stenosis.  Aorta normal in size with no evidence of aneurysmal dilatation.  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 No date: Borderline diabetes No date: BPH with obstruction/lower urinary tract symptoms 07/18/2020: Coronary artery disease     Comment:  a.) LHC 07/18/2020 (setting of NSTEMI): 40% o-dLM,  95%               dLM-oLAD, 20% o-pLCx, 80% pLAD, 95% p-mLAD, 70% LPDA, 50%              OM3; b.) s/p 3v CABG 07/19/2020 No date: DDD (degenerative disc disease), thoracolumbar No date: Dupuytren's contracture of left hand No date: Dysphagia No date: Erectile dysfunction No date: GERD (gastroesophageal reflux disease) No date: Hepatic steatosis No date: Hiatal hernia 11/15/2020: History of echocardiogram     Comment:  a.) TTE 11/15/2020: EF 45-50%, glob HK, mod-sev LV dil,               mild RVE, mild LAE No date: History of stomach ulcers No date: HLD (hyperlipidemia) No date: HTN (hypertension) No date: Hypogonadism in male 07/18/2020: [facility]STEM[facility]     Comment:  a.) troponins trended: 20 --> 70 ng/L; b.) LHC               07/18/2020: 40% o-dLM, 95% dLM-oLAD, 20% o-pLCx, 80%               pLAD, 95% p-mLAD, 70% LPDA, 50% OM3 --> [facility]               for CVTS consult; c.) 3v CABG 07/19/2020 No date: Obesity No date: OSA (obstructive sleep apnea)     Comment:  a.) does not utilize nocturnal PAP therapy No date: Osteoarthritis No date: RBBB (right bundle branch block) 07/19/2020: S/P CABG x 3     Comment:  a.) LIMA-LAD, SVG-LPDA, SVG-OM2  --> procedure  complicated by blood loss anemia requiring transfusion.                Also had PVCs requiring IV amiodarone: Discontinued at               discharge. No date: Vitamin D deficiency  Past Surgical History: No date: ACHILLES TENDON REPAIR No date: APPENDECTOMY 07/2020: CARDIAC CATHETERIZATION 11/05/2021: COLONOSCOPY; N/A     Comment:  Procedure: COLONOSCOPY;  Surgeon: Jaynie Collins,              DO;  Location: [facility] ENDOSCOPY;  Service:               Gastroenterology;  Laterality: N/A; No date: COLONOSCOPY     Comment:  2005, 2011, 2014, 2017 07/19/2020: CORONARY ARTERY BYPASS GRAFT; N/A     Comment:  Procedure: CORONARY ARTERY BYPASS GRAFTING (CABG) TIMES                THREE ON PUMP USING LEFT INTERNAL MAMMARY ARTERY AND               ENDOSCOPICALLY HARVESTED RIGHT GREATER SAPHENOUS VEIN;                Surgeon: Linden Dolin, MD;  Location: [facility] OR;                Service: Open Heart Surgery;  Laterality: N/A; 07/19/2020: ENDOVEIN HARVEST OF GREATER SAPHENOUS VEIN; Right     Comment:  Procedure: ENDOVEIN HARVEST OF GREATER SAPHENOUS VEIN;                Surgeon: Linden Dolin, MD;  Location: [facility] OR;                Service: Open Heart Surgery;  Laterality: Right; 07/18/2020: LEFT HEART CATH AND CORONARY ANGIOGRAPHY; N/A     Comment:  Procedure: LEFT HEART CATH AND CORONARY ANGIOGRAPHY;                Surgeon: Iran Ouch, MD;  Location: [facility] INVASIVE               CV LAB;  Service: Cardiovascular;  Laterality: N/A; 07/19/2020: TEE WITHOUT CARDIOVERSION; N/A     Comment:  Procedure: TRANSESOPHAGEAL ECHOCARDIOGRAM (TEE);                Surgeon: Linden Dolin, MD;  Location: [facility] OR;                Service: Open Heart Surgery;  Laterality: N/A; No date: UPPER GASTROINTESTINAL ENDOSCOPY     Comment:  2011, 2014  BMI    Body Mass Index: 33.20 kg/m      Reproductive/Obstetrics negative OB ROS                             Anesthesia Physical Anesthesia Plan  ASA: 3  Anesthesia Plan: General LMA   Post-op Pain Management: Toradol IV (intra-op)* and Ofirmev IV (intra-op)*   Induction: Intravenous  PONV Risk Score and Plan: 2 and Dexamethasone, Ondansetron and Treatment may vary due to age or medical condition  Airway Management Planned: LMA  Additional Equipment:   Intra-op Plan:   Post-operative Plan: Extubation in OR  Informed Consent: I have reviewed the patients History and Physical, chart, labs and discussed the procedure including the risks, benefits and alternatives for the proposed anesthesia with the patient or authorized representative who has indicated his/her  understanding and  acceptance.     Dental Advisory Given  Plan Discussed with: Anesthesiologist, CRNA and Surgeon  Anesthesia Plan Comments: (Patient consented for risks of anesthesia including but not limited to:  - adverse reactions to medications - damage to eyes, teeth, lips or other oral mucosa - nerve damage due to positioning  - sore throat or hoarseness - Damage to heart, brain, nerves, lungs, other parts of body or loss of life  Patient voiced understanding.)        Anesthesia Quick Evaluation

## 2022-09-20 ENCOUNTER — Other Ambulatory Visit: Payer: Self-pay | Admitting: Cardiovascular Disease

## 2022-10-07 DIAGNOSIS — E7849 Other hyperlipidemia: Secondary | ICD-10-CM | POA: Diagnosis not present

## 2022-10-07 DIAGNOSIS — R7303 Prediabetes: Secondary | ICD-10-CM | POA: Diagnosis not present

## 2022-10-07 DIAGNOSIS — I2510 Atherosclerotic heart disease of native coronary artery without angina pectoris: Secondary | ICD-10-CM | POA: Diagnosis not present

## 2022-10-07 DIAGNOSIS — I77810 Thoracic aortic ectasia: Secondary | ICD-10-CM | POA: Diagnosis not present

## 2022-10-07 DIAGNOSIS — K219 Gastro-esophageal reflux disease without esophagitis: Secondary | ICD-10-CM | POA: Diagnosis not present

## 2022-10-11 DIAGNOSIS — I2510 Atherosclerotic heart disease of native coronary artery without angina pectoris: Secondary | ICD-10-CM | POA: Diagnosis not present

## 2022-10-11 DIAGNOSIS — E038 Other specified hypothyroidism: Secondary | ICD-10-CM | POA: Diagnosis not present

## 2022-10-11 DIAGNOSIS — E7849 Other hyperlipidemia: Secondary | ICD-10-CM | POA: Diagnosis not present

## 2022-10-11 DIAGNOSIS — I77810 Thoracic aortic ectasia: Secondary | ICD-10-CM | POA: Diagnosis not present

## 2022-10-11 DIAGNOSIS — R7303 Prediabetes: Secondary | ICD-10-CM | POA: Diagnosis not present

## 2022-10-11 DIAGNOSIS — E349 Endocrine disorder, unspecified: Secondary | ICD-10-CM | POA: Diagnosis not present

## 2022-10-11 DIAGNOSIS — K219 Gastro-esophageal reflux disease without esophagitis: Secondary | ICD-10-CM | POA: Diagnosis not present

## 2022-11-04 DIAGNOSIS — E038 Other specified hypothyroidism: Secondary | ICD-10-CM | POA: Diagnosis not present

## 2022-11-15 ENCOUNTER — Other Ambulatory Visit: Payer: Self-pay

## 2022-11-15 NOTE — Telephone Encounter (Signed)
Requested Prescriptions   Refused Prescriptions Disp Refills   pantoprazole (PROTONIX) 40 MG tablet 90 tablet 1    Sig: Take 1 tablet (40 mg total) by mouth daily.    Refused By: Guerry Minors    Reason for Refusal: Refill not appropriate   Request pharmacy to forward to PCP

## 2022-11-18 ENCOUNTER — Other Ambulatory Visit: Payer: Self-pay | Admitting: Cardiovascular Disease

## 2022-11-18 NOTE — Telephone Encounter (Signed)
Please contact pt for future appointment. Pt due for 12 month f/u. 

## 2022-11-19 ENCOUNTER — Other Ambulatory Visit: Payer: Self-pay

## 2022-11-19 NOTE — Telephone Encounter (Signed)
Error

## 2022-11-20 ENCOUNTER — Emergency Department
Admission: EM | Admit: 2022-11-20 | Discharge: 2022-11-20 | Disposition: A | Discharge status: Home / Self Care | Payer: PPO | Attending: Emergency Medicine | Admitting: Emergency Medicine

## 2022-11-20 ENCOUNTER — Emergency Department: Payer: PPO

## 2022-11-20 ENCOUNTER — Other Ambulatory Visit: Payer: Self-pay

## 2022-11-20 DIAGNOSIS — I2510 Atherosclerotic heart disease of native coronary artery without angina pectoris: Secondary | ICD-10-CM | POA: Diagnosis not present

## 2022-11-20 DIAGNOSIS — J90 Pleural effusion, not elsewhere classified: Secondary | ICD-10-CM | POA: Diagnosis not present

## 2022-11-20 DIAGNOSIS — R918 Other nonspecific abnormal finding of lung field: Secondary | ICD-10-CM | POA: Diagnosis not present

## 2022-11-20 DIAGNOSIS — K219 Gastro-esophageal reflux disease without esophagitis: Secondary | ICD-10-CM | POA: Insufficient documentation

## 2022-11-20 DIAGNOSIS — R079 Chest pain, unspecified: Secondary | ICD-10-CM

## 2022-11-20 DIAGNOSIS — I771 Stricture of artery: Secondary | ICD-10-CM | POA: Diagnosis not present

## 2022-11-20 DIAGNOSIS — I10 Essential (primary) hypertension: Secondary | ICD-10-CM | POA: Diagnosis not present

## 2022-11-20 DIAGNOSIS — R0789 Other chest pain: Secondary | ICD-10-CM | POA: Diagnosis not present

## 2022-11-20 LAB — BASIC METABOLIC PANEL
Anion gap: 11 (ref 5–15)
BUN: 24 mg/dL — ABNORMAL HIGH (ref 8–23)
CO2: 22 mmol/L (ref 22–32)
Calcium: 8.9 mg/dL (ref 8.9–10.3)
Chloride: 103 mmol/L (ref 98–111)
Creatinine, Ser: 0.93 mg/dL (ref 0.61–1.24)
GFR, Estimated: >60 mL/min (ref >60)
Glucose, Bld: 123 mg/dL — ABNORMAL HIGH (ref 70–99)
Potassium: 4.2 mmol/L (ref 3.5–5.1)
Sodium: 136 mmol/L (ref 135–145)

## 2022-11-20 LAB — CBC
HCT: 38.6 % — ABNORMAL LOW (ref 39.0–52.0)
Hemoglobin: 13.6 g/dL (ref 13.0–17.0)
MCH: 30.7 pg (ref 26.0–34.0)
MCHC: 35.2 g/dL (ref 30.0–36.0)
MCV: 87.1 fL (ref 80.0–100.0)
Platelets: 226 10*3/uL (ref 150–400)
RBC: 4.43 MIL/uL (ref 4.22–5.81)
RDW: 12.9 % (ref 11.5–15.5)
WBC: 8.9 10*3/uL (ref 4.0–10.5)
nRBC: 0 % (ref 0.0–0.2)

## 2022-11-20 LAB — TROPONIN I (HIGH SENSITIVITY)
Troponin I (High Sensitivity): 4 ng/L (ref <18)
Troponin I (High Sensitivity): 4 ng/L (ref <18)

## 2022-11-20 MED ORDER — PANTOPRAZOLE SODIUM 40 MG PO TBEC: 40.0000 mg | DELAYED_RELEASE_TABLET | Freq: Every day | ORAL | 1 refills | Status: DC

## 2022-11-20 NOTE — ED Provider Notes (Signed)
[facility] Provider Note    Event Date/Time   First MD Initiated Contact with Patient 11/20/22 1748     (approximate)   History   Chief Complaint: Chest Pain   HPI  Brian Banks is a 77 y.o. male with history of hyperlipidemia, CAD, hypertension who comes ED complaining of chest pain that started 3 days ago.  Intermittent, lasting about 5 seconds at a time, nonradiating, no shortness of breath diaphoresis or vomiting.  Feels like heartburn.  2 days prior to the onset of symptoms, he ran out of his Protonix due to being out of refills.  Denies any exertional symptoms.     Physical Exam   Triage Vital Signs: ED Triage Vitals  Encounter Vitals Group     BP 11/20/22 1538 111/70     Systolic BP Percentile --      Diastolic BP Percentile --      Pulse Rate 11/20/22 1538 73     Resp 11/20/22 1538 17     Temp 11/20/22 1538 98.5 F (36.9 C)     Temp Source 11/20/22 1538 Oral     SpO2 11/20/22 1538 94 %     Weight 11/20/22 1535 285 lb (129.3 kg)     Height 11/20/22 1535 6\' 5"  (1.956 m)     Head Circumference --      Peak Flow --      Pain Score 11/20/22 1534 5     Pain Loc --      Pain Education --      Exclude from Growth Chart --     Most recent vital signs: Vitals:   11/20/22 1844 11/20/22 1900  BP: 114/63 118/67  Pulse: 60 60  Resp: 14   Temp: 97.7 F (36.5 C)   SpO2: 98% 99%    General: Awake, no distress.  CV:  Good peripheral perfusion.  Regular rate rhythm Resp:  Normal effort.  Clear to auscultation bilaterally Abd:  No distention.  Soft nontender Other:  No lower extremity edema   ED Results / Procedures / Treatments   Labs (all labs ordered are listed, but only abnormal results are displayed) Labs Reviewed  BASIC METABOLIC PANEL - Abnormal; Notable for the following components:      Result Value   Glucose, Bld 123 (*)    BUN 24 (*)    All other components within normal limits  CBC - Abnormal; Notable for the  following components:   HCT 38.6 (*)    All other components within normal limits  TROPONIN I (HIGH SENSITIVITY)  TROPONIN I (HIGH SENSITIVITY)     EKG Interpreted by me Sinus rhythm rate of 70.  Normal axis.  First-degree AV block.  Normal ST segments and T waves.   RADIOLOGY Chest x-ray interpreted by me, appears normal.  Radiology report reviewed   PROCEDURES:  Procedures   MEDICATIONS ORDERED IN ED: Medications - No data to display   IMPRESSION / MDM / ASSESSMENT AND PLAN / ED COURSE  I reviewed the triage vital signs and the nursing notes.  DDx: GERD, non-STEMI, pneumothorax, pneumonia  Patient's presentation is most consistent with acute presentation with potential threat to life or bodily function.  Patient presents with atypical chest discomfort, most likely GERD with abrupt discontinuation of his PPI.  Vital signs unremarkable, exam reassuring.  EKG chest x-ray and labs including serial troponins are all okay.  Refill for Protonix ordered, recommend close follow-up with his cardiologist for further  assessment.       FINAL CLINICAL IMPRESSION(S) / ED DIAGNOSES   Final diagnoses:  Nonspecific chest pain  Gastroesophageal reflux disease without esophagitis     Rx / DC Orders   ED Discharge Orders          Ordered    Ambulatory referral to Cardiology       Comments: If you have not heard from the Cardiology office within the next 72 hours please call [phone].   11/20/22 1901    pantoprazole (PROTONIX) 40 MG tablet  Daily       Note to Pharmacy: FOR NEXT FILL   11/20/22 1913             Note:  This document was prepared using Dragon voice recognition software and may include unintentional dictation errors.   Sharman Cheek, MD 11/21/22 [id number]

## 2022-11-20 NOTE — Discharge Instructions (Addendum)
Your test results were reassuring today.  Your doctor's office is in the process of sending refills of your pantoprazole to the pharmacy for you to restart.

## 2022-11-20 NOTE — ED Triage Notes (Signed)
Pt presents to ED with c/o of CP since Monday. Pt denies SOB. Pt denies N/V/D.

## 2022-12-06 ENCOUNTER — Other Ambulatory Visit: Payer: Self-pay

## 2022-12-06 MED ORDER — METOPROLOL SUCCINATE ER 25 MG PO TB24: 25.0000 mg | ORAL_TABLET | Freq: Every day | ORAL | 0 refills | Status: DC

## 2022-12-06 NOTE — Telephone Encounter (Signed)
Requested Prescriptions   Signed Prescriptions Disp Refills   metoprolol succinate (TOPROL-XL) 25 MG 24 hr tablet 90 tablet 0    Sig: Take 1 tablet (25 mg total) by mouth daily.    Authorizing Provider: Lorine Bears A    Ordering User: Guerry Minors

## 2022-12-21 ENCOUNTER — Other Ambulatory Visit: Payer: Self-pay | Admitting: Cardiovascular Disease

## 2022-12-23 ENCOUNTER — Ambulatory Visit: Payer: PPO | Attending: Medical | Admitting: Medical

## 2022-12-23 ENCOUNTER — Telehealth: Payer: Self-pay | Admitting: Cardiovascular Disease

## 2022-12-23 ENCOUNTER — Encounter: Payer: Self-pay | Admitting: Medical

## 2022-12-23 VITALS — BP 99/52 | HR 77 | Ht 77.0 in | Wt 286.8 lb

## 2022-12-23 DIAGNOSIS — I959 Hypotension, unspecified: Secondary | ICD-10-CM | POA: Diagnosis not present

## 2022-12-23 DIAGNOSIS — I25118 Atherosclerotic heart disease of native coronary artery with other forms of angina pectoris: Secondary | ICD-10-CM | POA: Diagnosis not present

## 2022-12-23 DIAGNOSIS — G4733 Obstructive sleep apnea (adult) (pediatric): Secondary | ICD-10-CM

## 2022-12-23 DIAGNOSIS — Z87891 Personal history of nicotine dependence: Secondary | ICD-10-CM

## 2022-12-23 DIAGNOSIS — I493 Ventricular premature depolarization: Secondary | ICD-10-CM

## 2022-12-23 DIAGNOSIS — E782 Mixed hyperlipidemia: Secondary | ICD-10-CM | POA: Diagnosis not present

## 2022-12-23 DIAGNOSIS — K219 Gastro-esophageal reflux disease without esophagitis: Secondary | ICD-10-CM | POA: Diagnosis not present

## 2022-12-23 MED ORDER — PANTOPRAZOLE SODIUM 40 MG PO TBEC: 40.0000 mg | DELAYED_RELEASE_TABLET | Freq: Every day | ORAL | 3 refills | Status: DC

## 2022-12-23 MED ORDER — ROSUVASTATIN CALCIUM 20 MG PO TABS: 20.0000 mg | ORAL_TABLET | Freq: Every day | ORAL | 3 refills | Status: DC

## 2022-12-23 MED ORDER — METOPROLOL SUCCINATE ER 25 MG PO TB24: 12.5000 mg | ORAL_TABLET | Freq: Every day | ORAL | 3 refills | Status: DC

## 2022-12-23 NOTE — Telephone Encounter (Signed)
*  STAT* If patient is at the pharmacy, call can be transferred to refill team.   1. Which medications need to be refilled? (please list name of each medication and dose if known)  rosuvastatin (CRESTOR) 20 MG tablet TAKE ONE TABLET BY MOUTH EVERY DAY     2. Which pharmacy/location (including street and city if local pharmacy) is medication to be sent to? [facility] - [place], [place] - [id number] Phone: [phone]  Fax: [phone]      3. Do they need a 30 day or 90 day supply? 90

## 2022-12-23 NOTE — Progress Notes (Signed)
Cardiology Office Note:    Date:  12/23/2022   ID:  Brian Banks, DOB [birth year], MRN [id number]  PCP:  Lynnea Ferrier, MD  [facility] Cardiologist:  Lorine Bears, MD  [facility] Electrophysiologist:  None   Referring MD: Lynnea Ferrier, MD   Chief Complaint: 1 year follow-up  History of Present Illness:    Brian Banks is a 77 y.o. male with a hx of CAD s/p CABG in 2022, LD, GERD, borderline diabetes, obesity and cigar use.   He presented in June 2022 with unstable angina. Cardiac cath showed left dominant coronary arteries with moderate left main disease, critial ostial LAD stenosis with tandem lesions in the proximal segment and significant ostial left PDA stenosis. EF was normal. The patient underwent CABG x3 with LIMA to LAD and SVG to left PDA and SVG to OM2. He ha dblood loss anemia that required transfusion and he also had PVCs that were treated with amiodarone which was subsequently discontinued . Echo in 2022 showed an EF 45-50%.   The patient was last seen 12/20/21 and was overall stable from a cardiac perspective.   ER visit 11/20/22 for chest pain. He mixed up his medications and ran out of PPI. He subsequently had chest pain and went to the ER. Work-up was negative for ACS in the ER.   Today, the patient reports he has been doing well. He is back taking the PPI and has not had any further chest pain. He denies SOB. He has occasional lower leg edema on the left. Denies heart racing or palpitations. He has rare lightheadedness or dizziness. BP is a little low.   Past Medical History:  Diagnosis Date   Benign neoplasm of large bowel    Borderline diabetes    BPH with obstruction/lower urinary tract symptoms    Coronary artery disease 07/18/2020   a.) LHC 07/18/2020 (setting of NSTEMI): 40% o-dLM, 95% dLM-oLAD, 20% o-pLCx, 80% pLAD, 95% p-mLAD, 70% LPDA, 50% OM3; b.) s/p 3v CABG 07/19/2020   DDD (degenerative disc disease), thoracolumbar    Dupuytren's  contracture of left hand    Dysphagia    Erectile dysfunction    GERD (gastroesophageal reflux disease)    Hepatic steatosis    Hiatal hernia    History of echocardiogram 11/15/2020   a.) TTE 11/15/2020: EF 45-50%, glob HK, mod-sev LV dil, mild RVE, mild LAE   History of stomach ulcers    HLD (hyperlipidemia)    HTN (hypertension)    Hypogonadism in male    NSTEMI (non-ST elevated myocardial infarction) (HCC) 07/18/2020   a.) troponins trended: 20 --> 70 ng/L; b.) LHC 07/18/2020: 40% o-dLM, 95% dLM-oLAD, 20% o-pLCx, 80% pLAD, 95% p-mLAD, 70% LPDA, 50% OM3 --> transfer to [facility] for CVTS consult; c.) 3v CABG 07/19/2020   Obesity    OSA (obstructive sleep apnea)    a.) does not utilize nocturnal PAP therapy   Osteoarthritis    RBBB (right bundle branch block)    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  Also had PVCs requiring IV amiodarone: Discontinued at discharge.   Vitamin D deficiency     Past Surgical History:  Procedure Laterality Date   ACHILLES TENDON REPAIR     APPENDECTOMY     CARDIAC CATHETERIZATION  07/2020   COLONOSCOPY N/A 11/05/2021   Procedure: COLONOSCOPY;  Surgeon: Jaynie Collins, DO;  Location: [facility]  ENDOSCOPY;  Service: Gastroenterology;  Laterality: N/A;   COLONOSCOPY     2005, 2011, 2014, 2017   CORONARY ARTERY BYPASS GRAFT N/A 07/19/2020   Procedure: CORONARY ARTERY BYPASS GRAFTING (CABG) TIMES THREE ON PUMP USING LEFT INTERNAL MAMMARY ARTERY AND ENDOSCOPICALLY HARVESTED RIGHT GREATER SAPHENOUS VEIN;  Surgeon: Linden Dolin, MD;  Location: [facility] OR;  Service: Open Heart Surgery;  Laterality: N/A;   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   Procedure: LEFT LITTLE FINGER DUPUYTREN'S EXCISION;  Surgeon: Christena Flake, MD;  Location: [facility] ORS;  Service: Orthopedics;  Laterality: Left;   ENDOVEIN HARVEST OF GREATER SAPHENOUS VEIN Right 07/19/2020   Procedure:  ENDOVEIN HARVEST OF GREATER SAPHENOUS VEIN;  Surgeon: Linden Dolin, MD;  Location: [facility] OR;  Service: Open Heart Surgery;  Laterality: Right;   LEFT HEART CATH AND CORONARY ANGIOGRAPHY N/A 07/18/2020   Procedure: LEFT HEART CATH AND CORONARY ANGIOGRAPHY;  Surgeon: Iran Ouch, MD;  Location: [facility] INVASIVE CV LAB;  Service: Cardiovascular;  Laterality: N/A;   TEE WITHOUT CARDIOVERSION N/A 07/19/2020   Procedure: TRANSESOPHAGEAL ECHOCARDIOGRAM (TEE);  Surgeon: Linden Dolin, MD;  Location: [facility] OR;  Service: Open Heart Surgery;  Laterality: N/A;   UPPER GASTROINTESTINAL ENDOSCOPY     2011, 2014    Current Medications: Current Meds  Medication Sig   aspirin EC 81 MG EC tablet Take 1 tablet (81 mg total) by mouth daily. Swallow whole.   finasteride (PROSCAR) 5 MG tablet TAKE 1 TABLET BY MOUTH DAILY   rosuvastatin (CRESTOR) 20 MG tablet TAKE ONE TABLET BY MOUTH EVERY DAY   [DISCONTINUED] metoprolol succinate (TOPROL-XL) 25 MG 24 hr tablet Take 1 tablet (25 mg total) by mouth daily.   [DISCONTINUED] pantoprazole (PROTONIX) 40 MG tablet Take 1 tablet (40 mg total) by mouth daily.     Allergies:   Lovastatin, Quinolones, and Androgel [testosterone]   Social History   Socioeconomic History   Marital status: Single    Spouse name: Not on file   Number of children: Not on file   Years of education: Not on file   Highest education level: Not on file  Occupational History   Not on file  Tobacco Use   Smoking status: Some Days    Types: Cigars   Smokeless tobacco: Never  Vaping Use   Vaping status: Never Used  Substance and Sexual Activity   Alcohol use: Not Currently    Comment: occassional   Drug use: No   Sexual activity: Not on file  Other Topics Concern   Not on file  Social History Narrative   Lives alone   Social Determinants of Health   Financial Resource Strain: Low Risk  (10/11/2022)   Received [facility]   Overall Financial Resource  Strain (CARDIA)    Difficulty of Paying Living Expenses: Not hard at all  Food Insecurity: No Food Insecurity (10/11/2022)   Received from [facility]   Hunger Vital Sign    Worried About Running Out of Food in the Last Year: Never true    Ran Out of Food in the Last Year: Never true  Transportation Needs: No Transportation Needs (10/11/2022)   Received from [facility] - Transportation    In the past 12 months, has lack of transportation kept you from medical appointments or from getting medications?: No    Lack of Transportation (Non-Medical): No  Physical Activity: Not on file  Stress: Not on file  Social Connections: Not on file     Family History: The patient's family history includes Alzheimer's disease in his father; Diabetes Mellitus II in his mother; Prostate cancer in his brother; Stroke in his mother.  ROS:   Please see the history of present illness.     All other systems reviewed and are negative.  EKGs/Labs/Other Studies Reviewed:    The following studies were reviewed today:  Echo 11/2020 1. Left ventricular ejection fraction, by estimation, is 45 to 50%. The  left ventricle has mildly decreased function. The left ventricle  demonstrates global hypokinesis. The left ventricular internal cavity size  was moderately to severely dilated. Left  ventricular diastolic parameters were normal.   2. Right ventricular systolic function is normal. The right ventricular  size is mildly enlarged.   3. Left atrial size was mildly dilated.   4. The mitral valve is normal in structure. No evidence of mitral valve  regurgitation.   5. The aortic valve is tricuspid. Aortic valve regurgitation is not  visualized.   LHC 07/2020 Ost LM to Dist LM lesion is 40% stenosed. Dist LM to Ost LAD lesion is 95% stenosed. Ost Cx to Prox Cx lesion is 20% stenosed. Prox LAD lesion is 80% stenosed. Prox LAD to Mid LAD lesion is 95%  stenosed. LPDA lesion is 70% stenosed. 3rd Mrg lesion is 50% stenosed. The left ventricular systolic function is normal. LV end diastolic pressure is mildly elevated. The left ventricular ejection fraction is 55-65% by visual estimate.   1.  Left dominant coronary arteries with moderate left main disease, critical ostial LAD disease with 2 other lesions in the proximal segment and significant ostial left PDA disease.  The ostial/proximal LAD is moderately to severely calcified.  The takeoff from the left main does not allow stenting of the LAD without jailing the large dominant left circumflex. 2.  Normal LV systolic function and mildly elevated left ventricular end-diastolic pressure.   Recommendations: The patient's LAD disease is not approachable percutaneously.  I think the best option is CABG. Transfer the patient to [facility]. Resume heparin at 9 PM. Obtain an echocardiogram.    EKG:  EKG is ordered today.  The ekg ordered today demonstrates NSR 1st degree AV block, PVC, RBBB, TWI anteroseptal leads.  Recent Labs: 11/20/2022: BUN 24; Creatinine, Ser 0.93; Hemoglobin 13.6; Platelets 226; Potassium 4.2; Sodium 136  Recent Lipid Panel    Component Value Date/Time   CHOL 179 07/18/2020 1019   TRIG 95 07/18/2020 1019   HDL 43 07/18/2020 1019   CHOLHDL 4.2 07/18/2020 1019   VLDL 19 07/18/2020 1019   LDLCALC 117 (H) 07/18/2020 1019    Physical Exam:    VS:  BP (!) 99/52 (BP Location: Left Arm, Patient Position: Sitting, Cuff Size: Large)   Pulse 77   Ht 6\' 5"  (1.956 m)   Wt 286 lb 12.8 oz (130.1 kg)   SpO2 96%   BMI 34.01 kg/m     Wt Readings from Last 3 Encounters:  12/23/22 286 lb 12.8 oz (130.1 kg)  11/20/22 285 lb (129.3 kg)  08/14/22 280 lb (127 kg)     GEN:  Well nourished, well developed in no acute distress HEENT: Normal NECK: No JVD; No carotid bruits LYMPHATICS: No lymphadenopathy CARDIAC: RRR, no murmurs, rubs, gallops RESPIRATORY:  Clear to  auscultation without rales, wheezing or rhonchi  ABDOMEN: Soft, non-tender, non-distended MUSCULOSKELETAL:  No edema; No deformity  SKIN: Warm and dry NEUROLOGIC:  Alert and  oriented x 3 PSYCHIATRIC:  Normal affect   ASSESSMENT:    1. Coronary artery disease involving native coronary artery of native heart with other form of angina pectoris (HCC)   2. Hypotension, unspecified hypotension type   3. Gastroesophageal reflux disease without esophagitis   4. Hyperlipidemia, mixed   5. Frequent PVCs   6. OSA (obstructive sleep apnea)   7. History of cigar smoking    PLAN:    In order of problems listed above:  Chest pain CAD s/p CABG Patient went to the ER on 10/10 for chest pain after running out of Protonix.  Workup for ACS was negative.  PPI was refilled and he has subsequently had no further chest pain.  Patient denies significant shortness of breath. EKG is unchanged. No further ischemic workup at this time. Continue aspirin 81 mg daily, beta-blocker and statin therapy.    Hypotension H/o HTN BP is low today at 99/52.  Patient reports occasional lightheadedness and dizziness.  I will decrease Toprol to 12.5 mg daily.  GERD Refill Protonix as above.   HLD LDL 43. Continue Crestor 20mg  daily.   PVCs EKG shows EKG with PVC. Decrease Toprol as above.   OSA He is not wanting further work-up for sleep apnea.   H/o cigar Still occasional smokes cigar, cessation discussed.  Disposition: Follow up in 1 year(s) with MD/APP   Signed, Shykeria Sakamoto David Stall, PA-C  12/23/2022 9:47 AM    [facility]

## 2022-12-23 NOTE — Telephone Encounter (Signed)
Requested Prescriptions   Signed Prescriptions Disp Refills  . rosuvastatin (CRESTOR) 20 MG tablet 90 tablet 3    Sig: Take 1 tablet (20 mg total) by mouth daily.    Authorizing Provider: Lorine BearsARIDA, MUHAMMAD A    Ordering User: Kendrick FriesLOPEZ, Sylvester Salonga C

## 2022-12-23 NOTE — Patient Instructions (Signed)
Medication Instructions:  Your physician recommends the following medication changes.  DECREASE: Toprol 12.5 mg daily  *If you need a refill on your cardiac medications before your next appointment, please call your pharmacy*   Lab Work: None ordered   Follow-Up: At [facility], you and your health needs are our priority.  As part of our continuing mission to provide you with exceptional heart care, we have created designated Provider Care Teams.  These Care Teams include your primary Cardiologist (physician) and Advanced Practice Providers (APPs -  Physician Assistants and Nurse Practitioners) who all work together to provide you with the care you need, when you need it.  We recommend signing up for the patient portal called "MyChart".  Sign up information is provided on this After Visit Summary.  MyChart is used to connect with patients for Virtual Visits (Telemedicine).  Patients are able to view lab/test results, encounter notes, upcoming appointments, etc.  Non-urgent messages can be sent to your provider as well.   To learn more about what you can do with MyChart, go to [email or web address].    Your next appointment:   12 month(s)  Provider:   You may see Lorine Bears, MD or one of the following Advanced Practice Providers on your designated Care Team:   Cadence Gerton, [place]

## 2022-12-23 NOTE — Telephone Encounter (Signed)
Pt has f/u with Fransico Michael today. Please address refill today at appointment. Thank you.

## 2023-01-14 DIAGNOSIS — H2513 Age-related nuclear cataract, bilateral: Secondary | ICD-10-CM | POA: Diagnosis not present

## 2023-01-14 DIAGNOSIS — H35362 Drusen (degenerative) of macula, left eye: Secondary | ICD-10-CM | POA: Diagnosis not present

## 2023-02-18 ENCOUNTER — Other Ambulatory Visit: Payer: Self-pay | Admitting: Urology

## 2023-02-18 DIAGNOSIS — N3941 Urge incontinence: Secondary | ICD-10-CM

## 2023-03-21 ENCOUNTER — Other Ambulatory Visit: Payer: Self-pay

## 2023-03-21 DIAGNOSIS — N138 Other obstructive and reflux uropathy: Secondary | ICD-10-CM

## 2023-03-24 ENCOUNTER — Other Ambulatory Visit: Payer: PPO

## 2023-03-24 ENCOUNTER — Other Ambulatory Visit: Payer: Self-pay

## 2023-03-24 DIAGNOSIS — N401 Enlarged prostate with lower urinary tract symptoms: Secondary | ICD-10-CM | POA: Diagnosis not present

## 2023-03-24 DIAGNOSIS — N138 Other obstructive and reflux uropathy: Secondary | ICD-10-CM | POA: Diagnosis not present

## 2023-03-25 LAB — PSA: Prostate Specific Ag, Serum: 0.8 ng/mL (ref 0.0–4.0)

## 2023-03-25 NOTE — Progress Notes (Unsigned)
03/27/2023  9:03 AM   Brian Banks 1945/07/09 [id number]  Referring provider: Lynnea Ferrier, MD [address] [facility] [place],  [place] [address]  Urological history 1. BPH with LU TS -PSA (03/2023) 0.8 -cysto 03/2019- moderate lateral lobe enlargement prostate - mild elevation bladder neck -finasteride 5 mg daily   2. Urge incontinence -contributing factors of age and BPH -not a candidate for anticholinergic therapy due to age and dry eyes  -failed Myrbetriq  -treated with PTNS, but failed  -failed Gemtesa 75 mg daily   Chief Complaint  Patient presents with   Benign Prostatic Hypertrophy    HPI: Brian Banks is a 78 y.o. male who presents today for one year follow up.   Previous records reviewed.     I PSS 14/4  PVR 22 mL   He continues to have issues with urge incontinence.  He states there are days when he has no issues and then days that are worse.  He is a diet-controlled diabetic.  He also is going to restart CPAP therapy.  Patient denies any modifying or aggravating factors.  Patient denies any recent UTI's, gross hematuria, dysuria or suprapubic/flank pain.  Patient denies any fevers, chills, nausea or vomiting.     IPSS     Row Name 03/27/23 0800         International Prostate Symptom Score   How often have you had the sensation of not emptying your bladder? Not at All     How often have you had to urinate less than every two hours? More than half the time     How often have you found you stopped and started again several times when you urinated? Less than 1 in 5 times     How often have you found it difficult to postpone urination? More than half the time     How often have you had a weak urinary stream? Less than half the time     How often have you had to strain to start urination? Not at All     How many times did you typically get up at night to urinate? 3 Times     Total IPSS Score 14       Quality of Life due to  urinary symptoms   If you were to spend the rest of your life with your urinary condition just the way it is now how would you feel about that? Mostly Disatisfied             Score:  1-7 Mild 8-19 Moderate 20-35 Severe   PMH: Past Medical History:  Diagnosis Date   Benign neoplasm of large bowel    Borderline diabetes    BPH with obstruction/lower urinary tract symptoms    Coronary artery disease 07/18/2020   a.) LHC 07/18/2020 (setting of NSTEMI): 40% o-dLM, 95% dLM-oLAD, 20% o-pLCx, 80% pLAD, 95% p-mLAD, 70% LPDA, 50% OM3; b.) s/p 3v CABG 07/19/2020   DDD (degenerative disc disease), thoracolumbar    Dupuytren's contracture of left hand    Dysphagia    Erectile dysfunction    GERD (gastroesophageal reflux disease)    Hepatic steatosis    Hiatal hernia    History of echocardiogram 11/15/2020   a.) TTE 11/15/2020: EF 45-50%, glob HK, mod-sev LV dil, mild RVE, mild LAE   History of stomach ulcers    HLD (hyperlipidemia)    HTN (hypertension)    Hypogonadism in male  NSTEMI (non-ST elevated myocardial infarction) (HCC) 07/18/2020   a.) troponins trended: 20 --> 70 ng/L; b.) LHC 07/18/2020: 40% o-dLM, 95% dLM-oLAD, 20% o-pLCx, 80% pLAD, 95% p-mLAD, 70% LPDA, 50% OM3 --> transfer to [facility] for CVTS consult; c.) 3v CABG 07/19/2020   Obesity    OSA (obstructive sleep apnea)    a.) does not utilize nocturnal PAP therapy   Osteoarthritis    RBBB (right bundle branch block)    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  Also had PVCs requiring IV amiodarone: Discontinued at discharge.   Vitamin D deficiency     Surgical History: Past Surgical History:  Procedure Laterality Date   ACHILLES TENDON REPAIR     APPENDECTOMY     CARDIAC CATHETERIZATION  07/2020   COLONOSCOPY N/A 11/05/2021   Procedure: COLONOSCOPY;  Surgeon: Jaynie Collins, DO;  Location: [facility] ENDOSCOPY;  Service:  Gastroenterology;  Laterality: N/A;   COLONOSCOPY     2005, 2011, 2014, 2017   CORONARY ARTERY BYPASS GRAFT N/A 07/19/2020   Procedure: CORONARY ARTERY BYPASS GRAFTING (CABG) TIMES THREE ON PUMP USING LEFT INTERNAL MAMMARY ARTERY AND ENDOSCOPICALLY HARVESTED RIGHT GREATER SAPHENOUS VEIN;  Surgeon: Linden Dolin, MD;  Location: [facility] OR;  Service: Open Heart Surgery;  Laterality: N/A;   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   Procedure: LEFT LITTLE FINGER DUPUYTREN'S EXCISION;  Surgeon: Christena Flake, MD;  Location: [facility] ORS;  Service: Orthopedics;  Laterality: Left;   ENDOVEIN HARVEST OF GREATER SAPHENOUS VEIN Right 07/19/2020   Procedure: ENDOVEIN HARVEST OF GREATER SAPHENOUS VEIN;  Surgeon: Linden Dolin, MD;  Location: [facility] OR;  Service: Open Heart Surgery;  Laterality: Right;   LEFT HEART CATH AND CORONARY ANGIOGRAPHY N/A 07/18/2020   Procedure: LEFT HEART CATH AND CORONARY ANGIOGRAPHY;  Surgeon: Iran Ouch, MD;  Location: [facility] INVASIVE CV LAB;  Service: Cardiovascular;  Laterality: N/A;   TEE WITHOUT CARDIOVERSION N/A 07/19/2020   Procedure: TRANSESOPHAGEAL ECHOCARDIOGRAM (TEE);  Surgeon: Linden Dolin, MD;  Location[facility] OR;  Service: Open Heart Surgery;  Laterality: N/A;   UPPER GASTROINTESTINAL ENDOSCOPY     2011, 2014    Home Medications:  Allergies as of 03/27/2023       Reactions   Lovastatin    Reports statin intolerance/myalgias.  Tolerates Crestor 20 mg daily.   Quinolones    Contraindicated given aortic dilation   Androgel [testosterone] Rash        Medication List        Accurate as of March 27, 2023  9:03 AM. If you have any questions, ask your nurse or doctor.          STOP taking these medications    HYDROcodone-acetaminophen 5-325 MG tablet Commonly known as: NORCO/VICODIN       TAKE these medications    aspirin EC 81 MG tablet Take 1 tablet (81 mg total) by mouth daily. Swallow whole.   finasteride 5 MG tablet Commonly  known as: PROSCAR TAKE 1 TABLET BY MOUTH DAILY   metoprolol succinate 25 MG 24 hr tablet Commonly known as: TOPROL-XL Take 0.5 tablets (12.5 mg total) by mouth daily.   pantoprazole 40 MG tablet Commonly known as: PROTONIX Take 1 tablet (40 mg total) by mouth daily.   rosuvastatin 20 MG tablet Commonly known as: CRESTOR Take 1 tablet (20 mg total) by mouth daily.        Allergies:  Allergies  Allergen Reactions   Lovastatin     Reports statin intolerance/myalgias.  Tolerates Crestor 20 mg daily.   Quinolones     Contraindicated given aortic dilation   Androgel [Testosterone] Rash    Family History: Family History  Problem Relation Age of Onset   Alzheimer's disease Father    Prostate cancer Brother    Diabetes Mellitus II Mother    Stroke Mother     Social History:  reports that he has been smoking cigars. He has never used smokeless tobacco. He reports that he does not currently use alcohol. He reports that he does not use drugs.  For pertinent review of systems please refer to history of present illness  Physical Exam: BP 138/74   Pulse 68   Ht 6\' 5"  (1.956 m)   BMI 34.01 kg/m   Constitutional:  Well nourished. Alert and oriented, No acute distress. HEENT: [place] AT, moist mucus membranes.  Trachea midline, no masses. Cardiovascular: No clubbing, cyanosis, or edema. Respiratory: Normal respiratory effort, no increased work of breathing. Neurologic: Grossly intact, no focal deficits, moving all 4 extremities. Psychiatric: Normal mood and affect.   Laboratory Data: Component     Latest Ref Rng 03/24/2023  Prostate Specific Ag, Serum     0.0 - 4.0 ng/mL 0.8    CBC    Component Value Date/Time   WBC 8.9 11/20/2022 1541   RBC 4.43 11/20/2022 1541   HGB 13.6 11/20/2022 1541   HGB 14.7 11/14/2011 1148   HCT 38.6 (L) 11/20/2022 1541   HCT 43.3 11/14/2011 1148   PLT 226 11/20/2022 1541   PLT 210 11/14/2011 1148   MCV 87.1 11/20/2022 1541   MCV 88  11/14/2011 1148   MCH 30.7 11/20/2022 1541   MCHC 35.2 11/20/2022 1541   RDW 12.9 11/20/2022 1541   RDW 13.3 11/14/2011 1148   LYMPHSABS 1.1 07/18/2020 1019   LYMPHSABS 1.6 11/14/2011 1148   MONOABS 0.3 07/18/2020 1019   MONOABS 0.4 11/14/2011 1148   EOSABS 0.1 07/18/2020 1019   EOSABS 0.1 11/14/2011 1148   BASOSABS 0.0 07/18/2020 1019   BASOSABS 0.1 11/14/2011 1148    BMET    Component Value Date/Time   NA 136 11/20/2022 1541   NA 142 11/14/2011 1148   K 4.2 11/20/2022 1541   K 4.1 11/14/2011 1148   CL 103 11/20/2022 1541   CL 109 (H) 11/14/2011 1148   CO2 22 11/20/2022 1541   CO2 25 11/14/2011 1148   GLUCOSE 123 (H) 11/20/2022 1541   GLUCOSE 83 11/14/2011 1148   BUN 24 (H) 11/20/2022 1541   BUN 18 11/14/2011 1148   CREATININE 0.93 11/20/2022 1541   CREATININE 0.99 11/14/2011 1148   CALCIUM 8.9 11/20/2022 1541   CALCIUM 8.6 11/14/2011 1148   GFRNONAA >60 11/20/2022 1541   GFRNONAA >60 11/14/2011 1148    Thyroid Stimulating-Hormone (TSH) Order: [id number] Component Ref Range & Units 4 mo ago  Thyroid Stimulating Hormone (TSH) 0.450-5.330 uIU/ml uIU/mL 4.670  Resulting Agency [facility] - LAB   Specimen Collected: 11/04/22 07:18   Performed by: [facility] Last Resulted: 11/04/22 10:56  Received From: [facility]  Result Received: 11/15/22 11:29   Urinalysis w/Microscopic Order: [id number] Component Ref Range & Units 5 mo ago  Color Colorless, Straw, Light Yellow, Yellow, Dark Yellow Light Yellow  Clarity Clear Clear  Specific Gravity 1.005 - 1.030 1.017  pH, Urine 5.0 - 8.0 6.0  Protein, Urinalysis Negative mg/dL Negative  Glucose, Urinalysis Negative mg/dL Negative  Ketones, Urinalysis Negative mg/dL Negative  Blood, Urinalysis Negative Negative  Nitrite, Urinalysis Negative Negative  Leukocyte Esterase, Urinalysis Negative Negative  Bilirubin, Urinalysis Negative Negative  Urobilinogen,  Urinalysis 0.2 - 1.0 mg/dL 0.2  WBC, UA <=5 /hpf <1  Red Blood Cells, Urinalysis <=3 /hpf <1  Bacteria, Urinalysis 0 - 5 /hpf 0-5  Squamous Epithelial Cells, Urinalysis /hpf 0  Resulting Agency [facility]   Specimen Collected: 10/07/22 07:27   Performed by: [facility] Last Resulted: 10/07/22 09:04  Received From: [facility]  Result Received: 11/15/22 11:29  I have reviewed the labs.  Pertinent Imaging  03/27/23 08:29  Scan Result 22 ml    Assessment & Plan:    1. BPH with LUTS -PSA stable -most bothersome symptom is urge incontinence -Continue finasteride 5 mg daily  2. Urge incontinence -He has failed Myrbetriq, Gemtesa and PTNS -Cystoscopy demonstrated moderate lateral lobe enlargement -He is not a candidate for anticholinergics due to his age and already having issues with dry eyes -I offered to refer him for urodynamic studies so we could further investigate into the cause of his urge incontinence, he deferred -Offered a referral to pelvic floor physical therapy, he deferred -I did mention that he should check his blood sugar levels at the time he is experiencing his urge incontinence to see if it occurs with elevated blood sugar levels -He states he can live with it as it is now and wants to try more behavioral changes with diet and weight loss  Return in about 1 year (around 03/26/2024) for PSA, I PSS .  Michiel Cowboy, PA-C  [facility] [address] [place], [place] [address] [phone]

## 2023-03-27 ENCOUNTER — Ambulatory Visit: Payer: Self-pay | Admitting: Urology

## 2023-03-27 ENCOUNTER — Encounter: Payer: Self-pay | Admitting: Urology

## 2023-03-27 VITALS — BP 138/74 | HR 68 | Ht 77.0 in

## 2023-03-27 DIAGNOSIS — N3941 Urge incontinence: Secondary | ICD-10-CM

## 2023-03-27 DIAGNOSIS — N401 Enlarged prostate with lower urinary tract symptoms: Secondary | ICD-10-CM

## 2023-03-27 DIAGNOSIS — N138 Other obstructive and reflux uropathy: Secondary | ICD-10-CM

## 2023-03-27 LAB — BLADDER SCAN AMB NON-IMAGING: Scan Result: 22

## 2023-03-27 MED ORDER — FINASTERIDE 5 MG PO TABS: 5.0000 mg | ORAL_TABLET | Freq: Every day | ORAL | 3 refills | Status: AC

## 2023-04-01 DIAGNOSIS — K219 Gastro-esophageal reflux disease without esophagitis: Secondary | ICD-10-CM | POA: Diagnosis not present

## 2023-04-01 DIAGNOSIS — Z800 Family history of malignant neoplasm of digestive organs: Secondary | ICD-10-CM | POA: Diagnosis not present

## 2023-04-01 DIAGNOSIS — Z860101 Personal history of adenomatous and serrated colon polyps: Secondary | ICD-10-CM | POA: Diagnosis not present

## 2023-04-09 DIAGNOSIS — E038 Other specified hypothyroidism: Secondary | ICD-10-CM | POA: Diagnosis not present

## 2023-04-09 DIAGNOSIS — I77810 Thoracic aortic ectasia: Secondary | ICD-10-CM | POA: Diagnosis not present

## 2023-04-09 DIAGNOSIS — E7849 Other hyperlipidemia: Secondary | ICD-10-CM | POA: Diagnosis not present

## 2023-04-09 DIAGNOSIS — E349 Endocrine disorder, unspecified: Secondary | ICD-10-CM | POA: Diagnosis not present

## 2023-04-09 DIAGNOSIS — R7303 Prediabetes: Secondary | ICD-10-CM | POA: Diagnosis not present

## 2023-04-09 DIAGNOSIS — K219 Gastro-esophageal reflux disease without esophagitis: Secondary | ICD-10-CM | POA: Diagnosis not present

## 2023-04-16 DIAGNOSIS — I2510 Atherosclerotic heart disease of native coronary artery without angina pectoris: Secondary | ICD-10-CM | POA: Diagnosis not present

## 2023-04-16 DIAGNOSIS — E7849 Other hyperlipidemia: Secondary | ICD-10-CM | POA: Diagnosis not present

## 2023-04-16 DIAGNOSIS — K219 Gastro-esophageal reflux disease without esophagitis: Secondary | ICD-10-CM | POA: Diagnosis not present

## 2023-04-16 DIAGNOSIS — Z0000 Encounter for general adult medical examination without abnormal findings: Secondary | ICD-10-CM | POA: Diagnosis not present

## 2023-04-16 DIAGNOSIS — E1165 Type 2 diabetes mellitus with hyperglycemia: Secondary | ICD-10-CM | POA: Diagnosis not present

## 2023-04-16 DIAGNOSIS — E349 Endocrine disorder, unspecified: Secondary | ICD-10-CM | POA: Diagnosis not present

## 2023-04-16 DIAGNOSIS — I77810 Thoracic aortic ectasia: Secondary | ICD-10-CM | POA: Diagnosis not present

## 2023-05-05 DIAGNOSIS — L821 Other seborrheic keratosis: Secondary | ICD-10-CM | POA: Diagnosis not present

## 2023-05-05 DIAGNOSIS — D2262 Melanocytic nevi of left upper limb, including shoulder: Secondary | ICD-10-CM | POA: Diagnosis not present

## 2023-05-05 DIAGNOSIS — D2261 Melanocytic nevi of right upper limb, including shoulder: Secondary | ICD-10-CM | POA: Diagnosis not present

## 2023-05-05 DIAGNOSIS — D2271 Melanocytic nevi of right lower limb, including hip: Secondary | ICD-10-CM | POA: Diagnosis not present

## 2023-05-05 DIAGNOSIS — D225 Melanocytic nevi of trunk: Secondary | ICD-10-CM | POA: Diagnosis not present

## 2023-05-05 DIAGNOSIS — L570 Actinic keratosis: Secondary | ICD-10-CM | POA: Diagnosis not present

## 2023-05-05 DIAGNOSIS — D2272 Melanocytic nevi of left lower limb, including hip: Secondary | ICD-10-CM | POA: Diagnosis not present

## 2023-05-15 ENCOUNTER — Encounter: Payer: Self-pay | Admitting: Gastroenterology

## 2023-05-19 ENCOUNTER — Encounter: Payer: Self-pay | Admitting: Gastroenterology

## 2023-05-26 ENCOUNTER — Encounter
Admission: RE | Disposition: A | Discharge status: Home / Self Care | Payer: Self-pay | Source: Home / Self Care | Attending: Gastroenterology

## 2023-05-26 ENCOUNTER — Encounter: Payer: Self-pay | Admitting: Gastroenterology

## 2023-05-26 ENCOUNTER — Ambulatory Visit: Admitting: Anesthesiology

## 2023-05-26 ENCOUNTER — Ambulatory Visit
Admission: RE | Admit: 2023-05-26 | Discharge: 2023-05-26 | Disposition: A | Discharge status: Home / Self Care | Payer: PPO | Attending: Gastroenterology | Admitting: Gastroenterology

## 2023-05-26 DIAGNOSIS — I509 Heart failure, unspecified: Secondary | ICD-10-CM | POA: Insufficient documentation

## 2023-05-26 DIAGNOSIS — D124 Benign neoplasm of descending colon: Secondary | ICD-10-CM | POA: Insufficient documentation

## 2023-05-26 DIAGNOSIS — I252 Old myocardial infarction: Secondary | ICD-10-CM | POA: Diagnosis not present

## 2023-05-26 DIAGNOSIS — I110 Hypertensive heart disease with heart failure: Secondary | ICD-10-CM | POA: Insufficient documentation

## 2023-05-26 DIAGNOSIS — E119 Type 2 diabetes mellitus without complications: Secondary | ICD-10-CM | POA: Insufficient documentation

## 2023-05-26 DIAGNOSIS — K649 Unspecified hemorrhoids: Secondary | ICD-10-CM | POA: Diagnosis not present

## 2023-05-26 DIAGNOSIS — D128 Benign neoplasm of rectum: Secondary | ICD-10-CM | POA: Insufficient documentation

## 2023-05-26 DIAGNOSIS — G4733 Obstructive sleep apnea (adult) (pediatric): Secondary | ICD-10-CM | POA: Insufficient documentation

## 2023-05-26 DIAGNOSIS — Z79899 Other long term (current) drug therapy: Secondary | ICD-10-CM | POA: Diagnosis not present

## 2023-05-26 DIAGNOSIS — K635 Polyp of colon: Secondary | ICD-10-CM | POA: Diagnosis not present

## 2023-05-26 DIAGNOSIS — M1990 Unspecified osteoarthritis, unspecified site: Secondary | ICD-10-CM | POA: Insufficient documentation

## 2023-05-26 DIAGNOSIS — Z1211 Encounter for screening for malignant neoplasm of colon: Secondary | ICD-10-CM | POA: Diagnosis not present

## 2023-05-26 DIAGNOSIS — D123 Benign neoplasm of transverse colon: Secondary | ICD-10-CM | POA: Diagnosis not present

## 2023-05-26 DIAGNOSIS — K219 Gastro-esophageal reflux disease without esophagitis: Secondary | ICD-10-CM | POA: Insufficient documentation

## 2023-05-26 DIAGNOSIS — D122 Benign neoplasm of ascending colon: Secondary | ICD-10-CM | POA: Insufficient documentation

## 2023-05-26 DIAGNOSIS — K5730 Diverticulosis of large intestine without perforation or abscess without bleeding: Secondary | ICD-10-CM | POA: Insufficient documentation

## 2023-05-26 DIAGNOSIS — D125 Benign neoplasm of sigmoid colon: Secondary | ICD-10-CM | POA: Insufficient documentation

## 2023-05-26 DIAGNOSIS — Z800 Family history of malignant neoplasm of digestive organs: Secondary | ICD-10-CM | POA: Diagnosis not present

## 2023-05-26 DIAGNOSIS — Z860101 Personal history of adenomatous and serrated colon polyps: Secondary | ICD-10-CM | POA: Diagnosis not present

## 2023-05-26 DIAGNOSIS — F17200 Nicotine dependence, unspecified, uncomplicated: Secondary | ICD-10-CM | POA: Insufficient documentation

## 2023-05-26 DIAGNOSIS — E785 Hyperlipidemia, unspecified: Secondary | ICD-10-CM | POA: Diagnosis not present

## 2023-05-26 DIAGNOSIS — I2510 Atherosclerotic heart disease of native coronary artery without angina pectoris: Secondary | ICD-10-CM | POA: Diagnosis not present

## 2023-05-26 DIAGNOSIS — Z7982 Long term (current) use of aspirin: Secondary | ICD-10-CM | POA: Diagnosis not present

## 2023-05-26 DIAGNOSIS — E669 Obesity, unspecified: Secondary | ICD-10-CM | POA: Insufficient documentation

## 2023-05-26 DIAGNOSIS — K640 First degree hemorrhoids: Secondary | ICD-10-CM | POA: Diagnosis not present

## 2023-05-26 DIAGNOSIS — Z951 Presence of aortocoronary bypass graft: Secondary | ICD-10-CM | POA: Diagnosis not present

## 2023-05-26 DIAGNOSIS — Z860100 Personal history of colon polyps, unspecified: Secondary | ICD-10-CM | POA: Diagnosis not present

## 2023-05-26 DIAGNOSIS — Z6832 Body mass index (BMI) 32.0-32.9, adult: Secondary | ICD-10-CM | POA: Insufficient documentation

## 2023-05-26 HISTORY — PX: POLYPECTOMY: SHX149

## 2023-05-26 HISTORY — PX: COLONOSCOPY WITH PROPOFOL: SHX5780

## 2023-05-26 HISTORY — DX: Endocrine disorder, unspecified: E34.9

## 2023-05-26 HISTORY — DX: Thoracic aortic ectasia: I77.810

## 2023-05-26 HISTORY — DX: Type 2 diabetes mellitus with hyperglycemia: E11.65

## 2023-05-26 LAB — GLUCOSE, CAPILLARY: Glucose-Capillary: 132 mg/dL — ABNORMAL HIGH (ref 70–99)

## 2023-05-26 SURGERY — COLONOSCOPY WITH PROPOFOL
Anesthesia: General

## 2023-05-26 MED ORDER — PROPOFOL 500 MG/50ML IV EMUL
INTRAVENOUS | Status: DC | PRN
  Administered 2023-05-26: 75 ug/kg/min via INTRAVENOUS

## 2023-05-26 MED ORDER — PROPOFOL 1000 MG/100ML IV EMUL
INTRAVENOUS | Status: AC
  Filled 2023-05-26: qty 100

## 2023-05-26 MED ORDER — SODIUM CHLORIDE 0.9 % IV SOLN
INTRAVENOUS | Status: DC
  Administered 2023-05-26: 20 mL/h via INTRAVENOUS

## 2023-05-26 MED ORDER — STERILE WATER FOR IRRIGATION IR SOLN
Status: DC | PRN
  Administered 2023-05-26: 60 mL

## 2023-05-26 MED ORDER — LIDOCAINE HCL (CARDIAC) PF 100 MG/5ML IV SOSY
PREFILLED_SYRINGE | INTRAVENOUS | Status: DC | PRN
  Administered 2023-05-26: 100 mg via INTRAVENOUS

## 2023-05-26 MED ORDER — LIDOCAINE HCL (PF) 2 % IJ SOLN
INTRAMUSCULAR | Status: AC
  Filled 2023-05-26: qty 10

## 2023-05-26 MED ORDER — PROPOFOL 10 MG/ML IV BOLUS
INTRAVENOUS | Status: DC | PRN
  Administered 2023-05-26: 50 mg via INTRAVENOUS

## 2023-05-26 MED ORDER — DEXMEDETOMIDINE HCL IN NACL 80 MCG/20ML IV SOLN
INTRAVENOUS | Status: AC
  Filled 2023-05-26: qty 20

## 2023-05-26 MED ORDER — DEXMEDETOMIDINE HCL IN NACL 80 MCG/20ML IV SOLN
INTRAVENOUS | Status: DC | PRN
Start: 2023-05-26 — End: 2023-05-26
  Administered 2023-05-26: 20 ug via INTRAVENOUS

## 2023-05-26 NOTE — H&P (Signed)
 Pre-Procedure H&P   Patient ID: Brian Banks is a 78 y.o. male.  Gastroenterology Provider: Jaynie Collins, DO  Referring Provider: Tawni Pummel, PA PCP: Lynnea Ferrier, MD  Date: 05/26/2023  HPI [name] is a 78 y.o. male who presents today for Colonoscopy for Personal history of colon polyps .  Patient last underwent colonoscopy in September 2023 with 16 adenomatous polyps removed.  1 year repeat was recommended at that time.  Normal TI and internal hemorrhoids also appreciated.  He was also recommended to undergo genetics evaluation as he has a strong family history of polyps in his mother and sister and colon cancer in his brother at age 57.  Multiple other siblings with polyps.  The patient declined this.  Bowel movements have been once every 2 to 3 days.  No melena or hematochezia.  Colonoscopy in 2017 and 2011 negative.  2014 with 4 adenomatous polyps   Past Medical History:  Diagnosis Date   Benign neoplasm of large bowel    Borderline diabetes    BPH with obstruction/lower urinary tract symptoms    Coronary artery disease 07/18/2020   a.) LHC 07/18/2020 (setting of NSTEMI): 40% o-dLM, 95% dLM-oLAD, 20% o-pLCx, 80% pLAD, 95% p-mLAD, 70% LPDA, 50% OM3; b.) s/p 3v CABG 07/19/2020   DDD (degenerative disc disease), thoracolumbar    Dilatation of thoracic aorta (HCC)    Dupuytren's contracture of left hand    Dysphagia    Erectile dysfunction    GERD (gastroesophageal reflux disease)    Hepatic steatosis    Hiatal hernia    History of echocardiogram 11/15/2020   a.) TTE 11/15/2020: EF 45-50%, glob HK, mod-sev LV dil, mild RVE, mild LAE   History of stomach ulcers    HLD (hyperlipidemia)    HTN (hypertension)    Hypogonadism in male    NSTEMI (non-ST elevated myocardial infarction) (HCC) 07/18/2020   a.) troponins trended: 20 --> 70 ng/L; b.) LHC 07/18/2020: 40% o-dLM, 95% dLM-oLAD, 20% o-pLCx, 80% pLAD, 95% p-mLAD, 70% LPDA, 50% OM3 -->  transfer to [facility] for CVTS consult; c.) 3v CABG 07/19/2020   Obesity    OSA (obstructive sleep apnea)    a.) does not utilize nocturnal PAP therapy   Osteoarthritis    RBBB (right bundle branch block)    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  Also had PVCs requiring IV amiodarone: Discontinued at discharge.   Testosterone deficiency    Type 2 diabetes mellitus with hyperglycemia (HCC)    Vitamin D deficiency     Past Surgical History:  Procedure Laterality Date   ACHILLES TENDON REPAIR     APPENDECTOMY     CARDIAC CATHETERIZATION  07/2020   COLONOSCOPY N/A 11/05/2021   Procedure: COLONOSCOPY;  Surgeon: Jaynie Collins, DO;  Location: [facility] ENDOSCOPY;  Service: Gastroenterology;  Laterality: N/A;   COLONOSCOPY     2005, 2011, 2014, 2017   CORONARY ARTERY BYPASS GRAFT N/A 07/19/2020   Procedure: CORONARY ARTERY BYPASS GRAFTING (CABG) TIMES THREE ON PUMP USING LEFT INTERNAL MAMMARY ARTERY AND ENDOSCOPICALLY HARVESTED RIGHT GREATER SAPHENOUS VEIN;  Surgeon: Linden Dolin, MD;  Location: [facility] OR;  Service: Open Heart Surgery;  Laterality: N/A;   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   Procedure: LEFT LITTLE FINGER DUPUYTREN'S EXCISION;  Surgeon: Christena Flake, MD;  Location: [facility] ORS;  Service: Orthopedics;  Laterality: Left;  Eardrum implant Left    ENDOVEIN HARVEST OF GREATER SAPHENOUS VEIN Right 07/19/2020   Procedure: ENDOVEIN HARVEST OF GREATER SAPHENOUS VEIN;  Surgeon: Linden Dolin, MD;  Location: [facility] OR;  Service: Open Heart Surgery;  Laterality: Right;   LEFT HEART CATH AND CORONARY ANGIOGRAPHY N/A 07/18/2020   Procedure: LEFT HEART CATH AND CORONARY ANGIOGRAPHY;  Surgeon: Iran Ouch, MD;  Location: [facility] INVASIVE CV LAB;  Service: Cardiovascular;  Laterality: N/A;   TEE WITHOUT CARDIOVERSION N/A 07/19/2020   Procedure: TRANSESOPHAGEAL ECHOCARDIOGRAM (TEE);  Surgeon:  Linden Dolin, MD;  Location: [facility] OR;  Service: Open Heart Surgery;  Laterality: N/A;   UPPER GASTROINTESTINAL ENDOSCOPY     2011, 2014    Family History Brother-colon cancer at 57.  Multiple siblings with polyps. No h/o GI disease or malignancy  Review of Systems  Constitutional:  Negative for activity change, appetite change, chills, diaphoresis, fatigue, fever and unexpected weight change.  HENT:  Negative for trouble swallowing and voice change.   Respiratory:  Negative for shortness of breath and wheezing.   Cardiovascular:  Negative for chest pain, palpitations and leg swelling.  Gastrointestinal:  Negative for abdominal distention, abdominal pain, anal bleeding, blood in stool, constipation, diarrhea, nausea and vomiting.  Musculoskeletal:  Negative for arthralgias and myalgias.  Skin:  Negative for color change and pallor.  Neurological:  Negative for dizziness, syncope and weakness.  Psychiatric/Behavioral:  Negative for confusion. The patient is not nervous/anxious.   All other systems reviewed and are negative.    Medications No current facility-administered medications on file prior to encounter.   Current Outpatient Medications on File Prior to Encounter  Medication Sig Dispense Refill   aspirin EC 81 MG EC tablet Take 1 tablet (81 mg total) by mouth daily. Swallow whole.     finasteride (PROSCAR) 5 MG tablet Take 1 tablet (5 mg total) by mouth daily. 90 tablet 3   metoprolol succinate (TOPROL-XL) 25 MG 24 hr tablet Take 0.5 tablets (12.5 mg total) by mouth daily. 45 tablet 3   pantoprazole (PROTONIX) 40 MG tablet Take 1 tablet (40 mg total) by mouth daily. 90 tablet 3   rosuvastatin (CRESTOR) 20 MG tablet Take 1 tablet (20 mg total) by mouth daily. 90 tablet 3    Pertinent medications related to GI and procedure were reviewed by me with the patient prior to the procedure   Current Facility-Administered Medications:    0.9 %  sodium chloride infusion, ,  Intravenous, Continuous, Jaynie Collins, DO, Last Rate: 20 mL/hr at 05/26/23 0954, 20 mL/hr at 05/26/23 0954  sodium chloride 20 mL/hr (05/26/23 0954)       Allergies  Allergen Reactions   Lovastatin     Reports statin intolerance/myalgias.  Tolerates Crestor 20 mg daily.   Quinolones     Contraindicated given aortic dilation   Androgel [Testosterone] Rash   Allergies were reviewed by me prior to the procedure  Objective   Body mass index is 32.97 kg/m. Vitals:   05/26/23 0939  BP: 126/78  Pulse: 80  Resp: 20  Temp: (!) 96.8 F (36 C)  TempSrc: Temporal  SpO2: 100%  Weight: 126.1 kg  Height: 6\' 5"  (1.956 m)     Physical Exam Vitals and nursing note reviewed.  Constitutional:      General: He is not in acute distress.    Appearance: Normal appearance. He is not ill-appearing, toxic-appearing or diaphoretic.  HENT:     Head: Normocephalic and atraumatic.  Nose: Nose normal.     Mouth/Throat:     Mouth: Mucous membranes are moist.     Pharynx: Oropharynx is clear.  Eyes:     General: No scleral icterus.    Extraocular Movements: Extraocular movements intact.  Cardiovascular:     Rate and Rhythm: Normal rate and regular rhythm.     Heart sounds: Normal heart sounds. No murmur heard.    No friction rub. No gallop.  Pulmonary:     Effort: Pulmonary effort is normal. No respiratory distress.     Breath sounds: Normal breath sounds. No wheezing, rhonchi or rales.  Abdominal:     General: Bowel sounds are normal. There is no distension.     Palpations: Abdomen is soft.     Tenderness: There is no abdominal tenderness. There is no guarding or rebound.  Musculoskeletal:     Cervical back: Neck supple.     Right lower leg: No edema.     Left lower leg: No edema.  Skin:    General: Skin is warm and dry.     Coloration: Skin is not jaundiced or pale.  Neurological:     General: No focal deficit present.     Mental Status: He is alert and oriented to  person, place, and time. Mental status is at baseline.  Psychiatric:        Mood and Affect: Mood normal.        Behavior: Behavior normal.        Thought Content: Thought content normal.        Judgment: Judgment normal.      Assessment:  [name] is a 78 y.o. male  who presents today for Colonoscopy for Personal history of colon polyps .  Plan:  Colonoscopy with possible intervention today  Colonoscopy with possible biopsy, control of bleeding, polypectomy, and interventions as necessary has been discussed with the patient/patient representative. Informed consent was obtained from the patient/patient representative after explaining the indication, nature, and risks of the procedure including but not limited to death, bleeding, perforation, missed neoplasm/lesions, cardiorespiratory compromise, and reaction to medications. Opportunity for questions was given and appropriate answers were provided. Patient/patient representative has verbalized understanding is amenable to undergoing the procedure.   [name], DO  [facility]astroenter[facility]  Portions of the record may have been created with voice recognition software. Occasional wrong-word or 'sound-a-like' substitutions may have occurred due to the inherent limitations of voice recognition software.  Read the chart carefully and recognize, using context, where substitutions may have occurred.

## 2023-05-26 NOTE — Interval H&P Note (Signed)
 History and Physical Interval Note: Preprocedure H&P from 05/26/23  was reviewed and there was no interval change after seeing and examining the patient.  Written consent was obtained from the patient after discussion of risks, benefits, and alternatives. Patient has consented to proceed with Colonoscopy with possible intervention   05/26/2023 10:18 AM  Brian Banks  has presented today for surgery, with the diagnosis of Z86.0101 (ICD-10-CM) - Personal history of adenomatous and serrated colon polyps.  The various methods of treatment have been discussed with the patient and family. After consideration of risks, benefits and other options for treatment, the patient has consented to  Procedure(s): COLONOSCOPY WITH PROPOFOL (N/A) as a surgical intervention.  The patient's history has been reviewed, patient examined, no change in status, stable for surgery.  I have reviewed the patient's chart and labs.  Questions were answered to the patient's satisfaction.     [name]

## 2023-05-26 NOTE — Transfer of Care (Signed)
 Immediate Anesthesia Transfer of Care Note  Patient: Brian Banks  Procedure(s) Performed: COLONOSCOPY WITH PROPOFOL POLYPECTOMY, INTESTINE  Patient Location: PACU  Anesthesia Type:General  Level of Consciousness: sedated  Airway & Oxygen Therapy: Patient Spontanous Breathing  Post-op Assessment: Report given to RN and Post -op Vital signs reviewed and stable  Post vital signs: Reviewed and stable  Last Vitals:  Vitals Value Taken Time  BP 98/62 05/26/23 1108  Temp 35.9 C 05/26/23 1108  Pulse 69 05/26/23 1109  Resp 13 05/26/23 1109  SpO2 97 % 05/26/23 1109  Vitals shown include unfiled device data.  Last Pain:  Vitals:   05/26/23 1108  TempSrc: Tympanic  PainSc: Asleep         Complications: No notable events documented.

## 2023-05-26 NOTE — Anesthesia Preprocedure Evaluation (Signed)
 Anesthesia Evaluation  Patient identified by MRN, date of birth, ID band Patient awake    Reviewed: Allergy & Precautions, NPO status , Patient's Chart, lab work & pertinent test results  Airway Mallampati: III  TM Distance: >3 FB Neck ROM: full    Dental  (+) Chipped   Pulmonary neg pulmonary ROS, Current Smoker   Pulmonary exam normal        Cardiovascular hypertension, + CAD, + Past MI, + CABG and +CHF  Normal cardiovascular exam+ dysrhythmias      Neuro/Psych negative neurological ROS  negative psych ROS   GI/Hepatic Neg liver ROS,GERD  Medicated and Controlled,,  Endo/Other  negative endocrine ROSdiabetes    Renal/GU negative Renal ROS  negative genitourinary   Musculoskeletal   Abdominal   Peds  Hematology negative hematology ROS (+)   Anesthesia Other Findings Past Medical History: No date: Benign neoplasm of large bowel No date: Borderline diabetes No date: BPH with obstruction/lower urinary tract symptoms 07/18/2020: Coronary artery disease     Comment:  a.) LHC 07/18/2020 (setting of NSTEMI): 40% o-dLM, 95%               dLM-oLAD, 20% o-pLCx, 80% pLAD, 95% p-mLAD, 70% LPDA, 50%              OM3; b.) s/p 3v CABG 07/19/2020 No date: DDD (degenerative disc disease), thoracolumbar No date: Dilatation of thoracic aorta (HCC) No date: Dupuytren's contracture of left hand No date: Dysphagia No date: Erectile dysfunction No date: GERD (gastroesophageal reflux disease) No date: Hepatic steatosis No date: Hiatal hernia 11/15/2020: History of echocardiogram     Comment:  a.) TTE 11/15/2020: EF 45-50%, glob HK, mod-sev LV dil,               mild RVE, mild LAE No date: History of stomach ulcers No date: HLD (hyperlipidemia) No date: HTN (hypertension) No date: Hypogonadism in male 07/18/2020: NSTEMI (non-ST elevated myocardial infarction) [facility])     Comment:  a.) troponins trended: 20 --> 70 ng/L; b.) LHC                07/18/2020: 40% o-dLM, 95% dLM-oLAD, 20% o-pLCx, 80%               pLAD, 95% p-mLAD, 70% LPDA, 50% OM3 --> transfer to [facility]               for CVTS consult; c.) 3v CABG 07/19/2020 No date: Obesity No date: OSA (obstructive sleep apnea)     Comment:  a.) does not utilize nocturnal PAP therapy No date: Osteoarthritis No date: RBBB (right bundle branch block) 07/19/2020: S/P CABG x 3     Comment:  a.) LIMA-LAD, SVG-LPDA, SVG-OM2  --> procedure               complicated by blood loss anemia requiring transfusion.                Also had PVCs requiring IV amiodarone: Discontinued at               discharge. No date: Testosterone deficiency No date: Type 2 diabetes mellitus with hyperglycemia (HCC) No date: Vitamin D deficiency  Past Surgical History: No date: ACHILLES TENDON REPAIR No date: APPENDECTOMY 07/2020: CARDIAC CATHETERIZATION 11/05/2021: COLONOSCOPY; N/A     Comment:  Procedure: COLONOSCOPY;  Surgeon: Jaynie Collins,              DO;  Location: [facility] ENDOSCOPY;  Service:  Gastroenterology;  Laterality: N/A; No date: COLONOSCOPY     Comment:  2005, 2011, 2014, 2017 07/19/2020: CORONARY ARTERY BYPASS GRAFT; N/A     Comment:  Procedure: CORONARY ARTERY BYPASS GRAFTING (CABG) TIMES               THREE ON PUMP USING LEFT INTERNAL MAMMARY ARTERY AND               ENDOSCOPICALLY HARVESTED RIGHT GREATER SAPHENOUS VEIN;                Surgeon: Linden Dolin, MD;  Location: [facility] OR;                Service: Open Heart Surgery;  Laterality: N/A; 08/14/2022: DUPUYTREN CONTRACTURE RELEASE; Left     Comment:  Procedure: LEFT LITTLE FINGER DUPUYTREN'S EXCISION;                Surgeon: Christena Flake, MD;  Location: [facility] ORS;                Service: Orthopedics;  Laterality: Left; No date: Eardrum implant; Left 07/19/2020: ENDOVEIN HARVEST OF GREATER SAPHENOUS VEIN; Right     Comment:  Procedure: ENDOVEIN HARVEST OF GREATER SAPHENOUS VEIN;                 Surgeon: Linden Dolin, MD;  Location: [facility] OR;                Service: Open Heart Surgery;  Laterality: Right; 07/18/2020: LEFT HEART CATH AND CORONARY ANGIOGRAPHY; N/A     Comment:  Procedure: LEFT HEART CATH AND CORONARY ANGIOGRAPHY;                Surgeon: Iran Ouch, MD;  Location: [facility] INVASIVE               CV LAB;  Service: Cardiovascular;  Laterality: N/A; 07/19/2020: TEE WITHOUT CARDIOVERSION; N/A     Comment:  Procedure: TRANSESOPHAGEAL ECHOCARDIOGRAM (TEE);                Surgeon: Linden Dolin, MD;  Location: [facility] OR;                Service: Open Heart Surgery;  Laterality: N/A; No date: UPPER GASTROINTESTINAL ENDOSCOPY     Comment:  2011, 2014  BMI    Body Mass Index: 32.97 kg/m      Reproductive/Obstetrics negative OB ROS                             Anesthesia Physical Anesthesia Plan  ASA: 3  Anesthesia Plan: General   Post-op Pain Management: Minimal or no pain anticipated   Induction: Intravenous  PONV Risk Score and Plan: 3 and Propofol infusion, TIVA and Ondansetron  Airway Management Planned: Nasal Cannula  Additional Equipment: None  Intra-op Plan:   Post-operative Plan:   Informed Consent: I have reviewed the patients History and Physical, chart, labs and discussed the procedure including the risks, benefits and alternatives for the proposed anesthesia with the patient or authorized representative who has indicated his/her understanding and acceptance.     Dental advisory given  Plan Discussed with: CRNA and Surgeon  Anesthesia Plan Comments: (Discussed risks of anesthesia with patient, including possibility of difficulty with spontaneous ventilation under anesthesia necessitating airway intervention, PONV, and rare risks such as cardiac or respiratory or neurological events, and allergic reactions. Discussed the role of CRNA in  patient's perioperative care. Patient understands.)       Anesthesia Quick  Evaluation

## 2023-05-26 NOTE — Op Note (Signed)
 [facility] Gastroenterology Patient Name: IVAL PACER Procedure Date: 05/26/2023 10:14 AM MRN: [id number] Account #: [id number] Date of Birth: [birth year] Admit Type: Outpatient Age: 78 Room: [facility] ENDO ROOM 2 Gender: Male Note Status: Finalized Instrument Name: Prentice Docker [id number] Procedure:             Colonoscopy Indications:           High risk colon cancer surveillance: Personal history                         of multiple (3 or more) adenomas, Family history of                         colon cancer in a first-degree relative before age 43                         years Providers:             Trenda Moots, DO Referring MD:          Daniel Nones, MD (Referring MD) Medicines:             Monitored Anesthesia Care Complications:         No immediate complications. Estimated blood loss:                         Minimal. Procedure:             Pre-Anesthesia Assessment:                        - Prior to the procedure, a History and Physical was                         performed, and patient medications and allergies were                         reviewed. The patient is competent. The risks and                         benefits of the procedure and the sedation options and                         risks were discussed with the patient. All questions                         were answered and informed consent was obtained.                         Patient identification and proposed procedure were                         verified by the physician, the nurse, the anesthetist                         and the technician in the endoscopy suite. Mental                         Status Examination: alert and oriented. Airway  Examination: normal oropharyngeal airway and neck                         mobility. Respiratory Examination: clear to                         auscultation. CV Examination: RRR, no murmurs, no S3                         or S4.  Prophylactic Antibiotics: The patient does not                         require prophylactic antibiotics. Prior                         Anticoagulants: The patient has taken no anticoagulant                         or antiplatelet agents. ASA Grade Assessment: III - A                         patient with severe systemic disease. After reviewing                         the risks and benefits, the patient was deemed in                         satisfactory condition to undergo the procedure. The                         anesthesia plan was to use monitored anesthesia care                         (MAC). Immediately prior to administration of                         medications, the patient was re-assessed for adequacy                         to receive sedatives. The heart rate, respiratory                         rate, oxygen saturations, blood pressure, adequacy of                         pulmonary ventilation, and response to care were                         monitored throughout the procedure. The physical                         status of the patient was re-assessed after the                         procedure.                        After obtaining informed consent, the colonoscope was  passed under direct vision. Throughout the procedure,                         the patient's blood pressure, pulse, and oxygen                         saturations were monitored continuously. The                         Colonoscope was introduced through the anus and                         advanced to the the terminal ileum, with                         identification of the appendiceal orifice and IC                         valve. The colonoscopy was performed without                         difficulty. The patient tolerated the procedure well.                         The quality of the bowel preparation was evaluated                         using the BBPS [facility] Bowel Preparation  Scale) with                         scores of: Right Colon = 3 (entire mucosa seen well                         with no residual staining, small fragments of stool or                         opaque liquid), Transverse Colon = 3 (entire mucosa                         seen well with no residual staining, small fragments                         of stool or opaque liquid) and Left Colon = 2 (minor                         amount of residual staining, small fragments of stool                         and/or opaque liquid, but mucosa seen well). The total                         BBPS score equals 8. The quality of the bowel                         preparation was excellent. The terminal ileum,  ileocecal valve, appendiceal orifice, and rectum were                         photographed. Findings:      The perianal and digital rectal examinations were normal. Pertinent       negatives include normal sphincter tone.      The terminal ileum appeared normal. Estimated blood loss: none.      Retroflexion in the right colon was performed.      11 sessile polyps were found in the sigmoid colon (3), transverse colon       (2) and ascending colon (3). The polyps were 1 to 2 mm in size. These       polyps were removed with a jumbo cold forceps. Resection and retrieval       were complete. Estimated blood loss was minimal.      Three sessile polyps were found in the rectum, descending colon and       transverse colon. The polyps were 3 to 5 mm in size. These polyps were       removed with a cold snare. Resection and retrieval were complete.       Estimated blood loss was minimal.      Multiple small-mouthed diverticula were found in the sigmoid colon.       Estimated blood loss: none.      Non-bleeding internal hemorrhoids were found during retroflexion. The       hemorrhoids were Grade I (internal hemorrhoids that do not prolapse).       Estimated blood loss: none.      The exam was  otherwise without abnormality on direct and retroflexion       views. Impression:            - The examined portion of the ileum was normal.                        - 11 1 to 2 mm polyps in the sigmoid colon, in the                         transverse colon and in the ascending colon, removed                         with a jumbo cold forceps. Resected and retrieved.                        - Three 3 to 5 mm polyps in the rectum, in the                         descending colon and in the transverse colon, removed                         with a cold snare. Resected and retrieved.                        - Diverticulosis in the sigmoid colon.                        - Non-bleeding internal hemorrhoids.                        -  The examination was otherwise normal on direct and                         retroflexion views. Recommendation:        - Patient has a contact number available for                         emergencies. The signs and symptoms of potential                         delayed complications were discussed with the patient.                         Return to normal activities tomorrow. Written                         discharge instructions were provided to the patient.                        - Discharge patient to home.                        - Resume previous diet.                        - Continue present medications.                        - No ibuprofen, naproxen, or other non-steroidal                         anti-inflammatory drugs for 5 days after polyp removal.                        - Await pathology results.                        - Repeat colonoscopy in 1 year for surveillance based                         on pathology results.                        - Return to GI office as previously scheduled.                        - Refer to a genetics counselor at appointment to be                         scheduled.                        - The findings and recommendations were  discussed with                         the patient. Procedure Code(s):     --- Professional ---                        [phone], Colonoscopy, flexible; with removal of  tumor(s), polyp(s), or other lesion(s) by snare                         technique                        45380, 59, Colonoscopy, flexible; with biopsy, single                         or multiple Diagnosis Code(s):     --- Professional ---                        Z86.010, Personal history of colonic polyps                        K64.0, First degree hemorrhoids                        D12.5, Benign neoplasm of sigmoid colon                        D12.2, Benign neoplasm of ascending colon                        D12.8, Benign neoplasm of rectum                        D12.4, Benign neoplasm of descending colon                        D12.3, Benign neoplasm of transverse colon (hepatic                         flexure or splenic flexure)                        Z80.0, Family history of malignant neoplasm of                         digestive organs                        K57.30, Diverticulosis of large intestine without                         perforation or abscess without bleeding CPT copyright 2022 American [employer] Association. All rights reserved. The codes documented in this report are preliminary and upon coder review may  be revised to meet current compliance requirements. Attending Participation:      I personally performed the entire procedure. Polo Brisk, DO [name] DO, DO 05/26/2023 11:15:08 AM This report has been signed electronically. Number of Addenda: 0 Note Initiated On: 05/26/2023 10:14 AM Scope Withdrawal Time: 0 hours 25 minutes 35 seconds  Total Procedure Duration: 0 hours 31 minutes 31 seconds  Estimated Blood Loss:  Estimated blood loss was minimal.      [facility]

## 2023-05-26 NOTE — Anesthesia Postprocedure Evaluation (Signed)
 Anesthesia Post Note  Patient: Brian Banks  Procedure(s) Performed: COLONOSCOPY WITH PROPOFOL POLYPECTOMY, INTESTINE  Patient location during evaluation: Endoscopy Anesthesia Type: General Level of consciousness: awake and alert Pain management: pain level controlled Vital Signs Assessment: post-procedure vital signs reviewed and stable Respiratory status: spontaneous breathing, nonlabored ventilation, respiratory function stable and patient connected to nasal cannula oxygen Cardiovascular status: blood pressure returned to baseline and stable Postop Assessment: no apparent nausea or vomiting Anesthetic complications: no  No notable events documented.   Last Vitals:  Vitals:   05/26/23 1108 05/26/23 1118  BP: 98/62 105/65  Pulse: 68 70  Resp: 12 17  Temp: (!) 35.9 C   SpO2: 97% 97%    Last Pain:  Vitals:   05/26/23 1118  TempSrc:   PainSc: 0-No pain                 Enrique Harvest

## 2023-05-27 ENCOUNTER — Encounter: Payer: Self-pay | Admitting: Gastroenterology

## 2023-05-27 LAB — SURGICAL PATHOLOGY

## 2023-10-01 ENCOUNTER — Other Ambulatory Visit: Payer: Self-pay | Admitting: Cardiovascular Disease

## 2023-11-11 DIAGNOSIS — H2513 Age-related nuclear cataract, bilateral: Secondary | ICD-10-CM | POA: Diagnosis not present

## 2023-11-11 DIAGNOSIS — H35372 Puckering of macula, left eye: Secondary | ICD-10-CM | POA: Diagnosis not present

## 2023-11-11 DIAGNOSIS — H16223 Keratoconjunctivitis sicca, not specified as Sjogren's, bilateral: Secondary | ICD-10-CM | POA: Diagnosis not present

## 2023-11-11 DIAGNOSIS — H43813 Vitreous degeneration, bilateral: Secondary | ICD-10-CM | POA: Diagnosis not present

## 2023-11-11 DIAGNOSIS — H538 Other visual disturbances: Secondary | ICD-10-CM | POA: Diagnosis not present

## 2023-11-18 DIAGNOSIS — H2513 Age-related nuclear cataract, bilateral: Secondary | ICD-10-CM | POA: Diagnosis not present

## 2023-11-18 DIAGNOSIS — H2511 Age-related nuclear cataract, right eye: Secondary | ICD-10-CM | POA: Diagnosis not present

## 2023-11-18 DIAGNOSIS — H2512 Age-related nuclear cataract, left eye: Secondary | ICD-10-CM | POA: Diagnosis not present

## 2023-11-20 ENCOUNTER — Encounter: Payer: Self-pay | Admitting: Ophthalmology

## 2023-11-24 NOTE — Discharge Instructions (Signed)

## 2023-11-25 ENCOUNTER — Encounter: Payer: Self-pay | Admitting: Ophthalmology

## 2023-11-25 NOTE — Anesthesia Preprocedure Evaluation (Addendum)
 Anesthesia Evaluation  Patient identified by MRN, date of birth, ID band Patient awake    Reviewed: Allergy & Precautions, H&P , NPO status , Patient's Chart, lab work & pertinent test results  Airway Mallampati: III  TM Distance: >3 FB Neck ROM: Full    Dental no notable dental hx.    Pulmonary neg pulmonary ROS, sleep apnea , Current Smoker   Pulmonary exam normal breath sounds clear to auscultation       Cardiovascular hypertension, + angina  + CAD and + Past MI  negative cardio ROS Normal cardiovascular exam+ dysrhythmias + Valvular Problems/Murmurs  Rhythm:Regular Rate:Normal  07-18-20 cath  Ost LM to Dist LM lesion is 40% stenosed.  Dist LM to Ost LAD lesion is 95% stenosed.  Ost Cx to Prox Cx lesion is 20% stenosed.  Prox LAD lesion is 80% stenosed.  Prox LAD to Mid LAD lesion is 95% stenosed.  LPDA lesion is 70% stenosed.  3rd Mrg lesion is 50% stenosed.  The left ventricular systolic function is normal.  LV end diastolic pressure is mildly elevated.  The left ventricular ejection fraction is 55-65% by visual estimate.   1.  Left dominant coronary arteries with moderate left main disease, critical ostial LAD disease with 2 other lesions in the proximal segment and significant ostial left PDA disease.  The ostial/proximal LAD is moderately to severely calcified.  The takeoff from the left main does not allow stenting of the LAD without jailing the large dominant left circumflex. 2.  Normal LV systolic function and mildly elevated left ventricular end-diastolic pressure.   Recommendations: The patient's LAD disease is not approachable percutaneously.  I think the best option is CABG. Transfer the patient to [facility]. Resume heparin  at 9 PM. Obtain an echocardiogram.    11-15-20 echo  1. Left ventricular ejection fraction, by estimation, is 45 to 50%. The  left ventricle has mildly decreased  function. The left ventricle  demonstrates global hypokinesis. The left ventricular internal cavity size  was moderately to severely dilated. Left  ventricular diastolic parameters were normal.   2. Right ventricular systolic function is normal. The right ventricular  size is mildly enlarged.   3. Left atrial size was mildly dilated.   4. The mitral valve is normal in structure. No evidence of mitral valve  regurgitation.   5. The aortic valve is tricuspid. Aortic valve regurgitation is not  visualized.     Neuro/Psych  Neuromuscular disease negative neurological ROS  negative psych ROS   GI/Hepatic negative GI ROS, Neg liver ROS, hiatal hernia,GERD  ,,  Endo/Other  negative endocrine ROSdiabetes    Renal/GU negative Renal ROS  negative genitourinary   Musculoskeletal negative musculoskeletal ROS (+) Arthritis ,    Abdominal   Peds negative pediatric ROS (+)  Hematology negative hematology ROS (+)   Anesthesia Other Findings HLD (hyperlipidemia)  Dysphagia Vitamin D deficiency  Benign neoplasm of large bowel History of stomach ulcers  Obesity BPH with obstruction/lower urinary tract symptoms  Erectile dysfunction Hypogonadism in male  Coronary artery disease NSTEMI (non-ST elevated myocardial infarction) (HCC)  GERD (gastroesophageal reflux disease) Hepatic steatosis  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 History of echocardiogram Hiatal hernia  Dupuytren's contracture of left hand HTN (hypertension)  DDD (degenerative disc disease), thoracolumbar Osteoarthritis  Borderline diabetes OSA (obstructive sleep apnea)  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)  Testosterone  deficiency Heart murmur  Reproductive/Obstetrics negative OB ROS                              Anesthesia Physical Anesthesia Plan  ASA: 3  Anesthesia Plan: MAC   Post-op Pain Management:     Induction: Intravenous  PONV Risk Score and Plan:   Airway Management Planned: Natural Airway and Nasal Cannula  Additional Equipment:   Intra-op Plan:   Post-operative Plan:   Informed Consent: I have reviewed the patients History and Physical, chart, labs and discussed the procedure including the risks, benefits and alternatives for the proposed anesthesia with the patient or authorized representative who has indicated his/her understanding and acceptance.     Dental Advisory Given  Plan Discussed with: Anesthesiologist, CRNA and Surgeon  Anesthesia Plan Comments: (Patient consented for risks of anesthesia including but not limited to:  - adverse reactions to medications - damage to eyes, teeth, lips or other oral mucosa - nerve damage due to positioning  - sore throat or hoarseness - Damage to heart, brain, nerves, lungs, other parts of body or loss of life  Patient voiced understanding and assent.)         Anesthesia Quick Evaluation

## 2023-11-26 ENCOUNTER — Encounter
Admission: RE | Disposition: A | Discharge status: Home / Self Care | Payer: Self-pay | Source: Home / Self Care | Attending: Ophthalmology

## 2023-11-26 ENCOUNTER — Encounter: Payer: Self-pay | Admitting: Ophthalmology

## 2023-11-26 ENCOUNTER — Ambulatory Visit
Admission: RE | Admit: 2023-11-26 | Discharge: 2023-11-26 | Disposition: A | Discharge status: Home / Self Care | Attending: Ophthalmology | Admitting: Ophthalmology

## 2023-11-26 ENCOUNTER — Ambulatory Visit: Payer: Self-pay | Admitting: Anesthesiology

## 2023-11-26 ENCOUNTER — Other Ambulatory Visit: Payer: Self-pay

## 2023-11-26 DIAGNOSIS — K219 Gastro-esophageal reflux disease without esophagitis: Secondary | ICD-10-CM | POA: Insufficient documentation

## 2023-11-26 DIAGNOSIS — I10 Essential (primary) hypertension: Secondary | ICD-10-CM | POA: Diagnosis not present

## 2023-11-26 DIAGNOSIS — M1990 Unspecified osteoarthritis, unspecified site: Secondary | ICD-10-CM | POA: Diagnosis not present

## 2023-11-26 DIAGNOSIS — H2512 Age-related nuclear cataract, left eye: Secondary | ICD-10-CM | POA: Diagnosis not present

## 2023-11-26 DIAGNOSIS — K449 Diaphragmatic hernia without obstruction or gangrene: Secondary | ICD-10-CM | POA: Insufficient documentation

## 2023-11-26 DIAGNOSIS — Z6833 Body mass index (BMI) 33.0-33.9, adult: Secondary | ICD-10-CM | POA: Insufficient documentation

## 2023-11-26 DIAGNOSIS — F17200 Nicotine dependence, unspecified, uncomplicated: Secondary | ICD-10-CM | POA: Diagnosis not present

## 2023-11-26 DIAGNOSIS — F17290 Nicotine dependence, other tobacco product, uncomplicated: Secondary | ICD-10-CM | POA: Diagnosis not present

## 2023-11-26 DIAGNOSIS — H5703 Miosis: Secondary | ICD-10-CM | POA: Insufficient documentation

## 2023-11-26 DIAGNOSIS — I252 Old myocardial infarction: Secondary | ICD-10-CM | POA: Diagnosis not present

## 2023-11-26 DIAGNOSIS — Z951 Presence of aortocoronary bypass graft: Secondary | ICD-10-CM | POA: Diagnosis not present

## 2023-11-26 DIAGNOSIS — Z79899 Other long term (current) drug therapy: Secondary | ICD-10-CM | POA: Diagnosis not present

## 2023-11-26 DIAGNOSIS — E1136 Type 2 diabetes mellitus with diabetic cataract: Secondary | ICD-10-CM | POA: Diagnosis not present

## 2023-11-26 DIAGNOSIS — Z7982 Long term (current) use of aspirin: Secondary | ICD-10-CM | POA: Insufficient documentation

## 2023-11-26 DIAGNOSIS — Z833 Family history of diabetes mellitus: Secondary | ICD-10-CM | POA: Diagnosis not present

## 2023-11-26 DIAGNOSIS — E669 Obesity, unspecified: Secondary | ICD-10-CM | POA: Diagnosis not present

## 2023-11-26 DIAGNOSIS — G4733 Obstructive sleep apnea (adult) (pediatric): Secondary | ICD-10-CM | POA: Insufficient documentation

## 2023-11-26 DIAGNOSIS — I25119 Atherosclerotic heart disease of native coronary artery with unspecified angina pectoris: Secondary | ICD-10-CM | POA: Diagnosis not present

## 2023-11-26 DIAGNOSIS — E785 Hyperlipidemia, unspecified: Secondary | ICD-10-CM | POA: Diagnosis not present

## 2023-11-26 DIAGNOSIS — I25110 Atherosclerotic heart disease of native coronary artery with unstable angina pectoris: Secondary | ICD-10-CM | POA: Diagnosis not present

## 2023-11-26 HISTORY — PX: CATARACT EXTRACTION W/PHACO: SHX586

## 2023-11-26 HISTORY — DX: Cardiac murmur, unspecified: R01.1

## 2023-11-26 HISTORY — DX: Prediabetes: R73.03

## 2023-11-26 SURGERY — PHACOEMULSIFICATION, CATARACT, WITH IOL INSERTION
Anesthesia: Monitor Anesthesia Care | Site: Eye | Laterality: Left

## 2023-11-26 MED ORDER — SIGHTPATH DOSE#1 NA HYALUR & NA CHOND-NA HYALUR IO KIT
PACK | INTRAOCULAR | Status: DC | PRN
Start: 2023-11-26 — End: 2023-11-26
  Administered 2023-11-26: 1 via OPHTHALMIC

## 2023-11-26 MED ORDER — MIDAZOLAM HCL 2 MG/2ML IJ SOLN
INTRAMUSCULAR | Status: DC | PRN
  Administered 2023-11-26: 2 mg via INTRAVENOUS

## 2023-11-26 MED ORDER — BRIMONIDINE TARTRATE-TIMOLOL 0.2-0.5 % OP SOLN
OPHTHALMIC | Status: DC | PRN
  Administered 2023-11-26: 1 [drp] via OPHTHALMIC

## 2023-11-26 MED ORDER — TETRACAINE HCL 0.5 % OP SOLN
OPHTHALMIC | Status: AC
  Filled 2023-11-26: qty 4

## 2023-11-26 MED ORDER — FENTANYL CITRATE (PF) 100 MCG/2ML IJ SOLN
INTRAMUSCULAR | Status: DC | PRN
  Administered 2023-11-26 (×2): 50 ug via INTRAVENOUS

## 2023-11-26 MED ORDER — FENTANYL CITRATE (PF) 100 MCG/2ML IJ SOLN
INTRAMUSCULAR | Status: AC
  Filled 2023-11-26: qty 2

## 2023-11-26 MED ORDER — CEFUROXIME OPHTHALMIC INJECTION 1 MG/0.1 ML
INJECTION | OPHTHALMIC | Status: DC | PRN
  Administered 2023-11-26: 1 mg via OPHTHALMIC

## 2023-11-26 MED ORDER — LIDOCAINE HCL (PF) 2 % IJ SOLN
INTRAMUSCULAR | Status: DC | PRN
  Administered 2023-11-26: 2 mL

## 2023-11-26 MED ORDER — EPINEPHRINE PF 1 MG/ML IJ SOLN
INTRAMUSCULAR | Status: DC | PRN
  Administered 2023-11-26: 77 mL via OPHTHALMIC

## 2023-11-26 MED ORDER — MIDAZOLAM HCL 2 MG/2ML IJ SOLN
INTRAMUSCULAR | Status: AC
  Filled 2023-11-26: qty 2

## 2023-11-26 MED ORDER — TETRACAINE HCL 0.5 % OP SOLN
1.0000 [drp] | OPHTHALMIC | Status: DC | PRN
  Administered 2023-11-26 (×3): 1 [drp] via OPHTHALMIC

## 2023-11-26 MED ORDER — ARMC OPHTHALMIC DILATING DROPS
OPHTHALMIC | Status: AC
  Filled 2023-11-26: qty 0.5

## 2023-11-26 MED ORDER — SIGHTPATH DOSE#1 BSS IO SOLN
INTRAOCULAR | Status: DC | PRN
  Administered 2023-11-26: 15 mL via INTRAOCULAR

## 2023-11-26 MED ORDER — ARMC OPHTHALMIC DILATING DROPS
1.0000 | OPHTHALMIC | Status: DC | PRN
  Administered 2023-11-26 (×3): 1 via OPHTHALMIC

## 2023-11-26 SURGICAL SUPPLY — 9 items
FEE CATARACT SUITE SIGHTPATH (MISCELLANEOUS) ×1 IMPLANT
GLOVE BIOGEL PI IND STRL 8 (GLOVE) ×1 IMPLANT
GLOVE SURG LX STRL 7.5 STRW (GLOVE) ×1 IMPLANT
GLOVE SURG SYN 6.5 PF PI BL (GLOVE) ×1 IMPLANT
LENS IOL CLRN VT TRC 3 16.0 IMPLANT
NDL FILTER BLUNT 18X1 1/2 (NEEDLE) ×1 IMPLANT
NEEDLE FILTER BLUNT 18X1 1/2 (NEEDLE) ×1 IMPLANT
RING MALYGIN 7.0 (MISCELLANEOUS) IMPLANT
SYR 3ML LL SCALE MARK (SYRINGE) ×1 IMPLANT

## 2023-11-26 NOTE — Transfer of Care (Signed)
 Immediate Anesthesia Transfer of Care Note  Patient: Brian Banks  Procedure(s) Performed: PHACOEMULSIFICATION, CATARACT, WITH IOL INSERTION 6.81 00:48.7 (Left: Eye)  Patient Location: PACU  Anesthesia Type: MAC  Level of Consciousness: awake, alert  and patient cooperative  Airway and Oxygen Therapy: Patient Spontanous Breathing   Post-op Assessment: Post-op Vital signs reviewed, Patient's Cardiovascular Status Stable, Respiratory Function Stable, Patent Airway and No signs of Nausea or vomiting  Post-op Vital Signs: Reviewed and stable  Complications: No notable events documented.

## 2023-11-26 NOTE — H&P (Signed)
 [facility]   Primary Care Physician:  Fernande Ophelia JINNY DOUGLAS, MD Ophthalmologist: Dr. Dene Etienne  Pre-Procedure History & Physical: HPI:  Brian Banks is a 78 y.o. male here for ophthalmic surgery.   Past Medical History:  Diagnosis Date   Benign neoplasm of large bowel    Borderline diabetes    BPH with obstruction/lower urinary tract symptoms    Coronary artery disease 07/18/2020   a.) LHC 07/18/2020 (setting of NSTEMI): 40% o-dLM, 95% dLM-oLAD, 20% o-pLCx, 80% pLAD, 95% p-mLAD, 70% LPDA, 50% OM3; b.) s/p 3v CABG 07/19/2020   DDD (degenerative disc disease), thoracolumbar    Dilatation of thoracic aorta    Dupuytren's contracture of left hand    Dysphagia    Erectile dysfunction    GERD (gastroesophageal reflux disease)    Heart murmur    Hepatic steatosis    Hiatal hernia    History of echocardiogram 11/15/2020   a.) TTE 11/15/2020: EF 45-50%, glob HK, mod-sev LV dil, mild RVE, mild LAE   History of stomach ulcers    HLD (hyperlipidemia)    HTN (hypertension)    Hypogonadism in male    NSTEMI (non-ST elevated myocardial infarction) (HCC) 07/18/2020   a.) troponins trended: 20 --> 70 ng/L; b.) LHC 07/18/2020: 40% o-dLM, 95% dLM-oLAD, 20% o-pLCx, 80% pLAD, 95% p-mLAD, 70% LPDA, 50% OM3 --> transfer to [facility] for CVTS consult; c.) 3v CABG 07/19/2020   Obesity    OSA (obstructive sleep apnea)    a.) does not utilize nocturnal PAP therapy   Osteoarthritis    RBBB (right bundle branch block)    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  Also had PVCs requiring IV amiodarone : Discontinued at discharge.   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)    Vitamin D deficiency     Past Surgical History:  Procedure Laterality Date   ACHILLES TENDON REPAIR     APPENDECTOMY     CARDIAC CATHETERIZATION  07/2020   COLONOSCOPY N/A 11/05/2021    Procedure: COLONOSCOPY;  Surgeon: Onita Elspeth Ozell, DO;  Location: [facility] ENDOSCOPY;  Service: Gastroenterology;  Laterality: N/A;   COLONOSCOPY     2005, 2011, 2014, 2017   COLONOSCOPY WITH PROPOFOL  N/A 05/26/2023   Procedure: COLONOSCOPY WITH PROPOFOL ;  Surgeon: Onita Elspeth Ozell, DO;  Location: [facility] ENDOSCOPY;  Service: Gastroenterology;  Laterality: N/A;   CORONARY ARTERY BYPASS GRAFT N/A 07/19/2020   Procedure: CORONARY ARTERY BYPASS GRAFTING (CABG) TIMES THREE ON PUMP USING LEFT INTERNAL MAMMARY ARTERY AND ENDOSCOPICALLY HARVESTED RIGHT GREATER SAPHENOUS VEIN;  Surgeon: German Bartlett PEDLAR, MD;  [facility]: [facility] OR;  Service: Open Heart Surgery;  Laterality: N/A;   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   Procedure: LEFT LITTLE FINGER DUPUYTREN'S EXCISION;  Surgeon: Edie Norleen JINNY, MD;  Location: [facility] ORS;  Service: Orthopedics;  Laterality: Left;   Eardrum implant Left    ENDOVEIN HARVEST OF GREATER SAPHENOUS VEIN Right 07/19/2020   Procedure: ENDOVEIN HARVEST OF GREATER SAPHENOUS VEIN;  Surgeon: German Bartlett PEDLAR, MD;  [facility]: [facility] OR;  Service: Open Heart Surgery;  Laterality: Right;   LEFT HEART CATH AND CORONARY ANGIOGRAPHY N/A 07/18/2020   Procedure: LEFT HEART CATH AND CORONARY ANGIOGRAPHY;  Surgeon: Darron Deatrice LABOR, MD;  Location: [facility] INVASIVE CV LAB;  Service: Cardiovascular;  Laterality: N/A;   POLYPECTOMY  05/26/2023   Procedure: POLYPECTOMY, INTESTINE;  Surgeon: Onita Elspeth  Ozell, DO;  Location: [facility] ENDOSCOPY;  Service: Gastroenterology;;   TEE WITHOUT CARDIOVERSION N/A 07/19/2020   Procedure: TRANSESOPHAGEAL ECHOCARDIOGRAM (TEE);  Surgeon: German Bartlett PEDLAR, MD;  Location: [facility] OR;  Service: Open Heart Surgery;  Laterality: N/A;   UPPER GASTROINTESTINAL ENDOSCOPY     2011, 2014    Prior to Admission medications   Medication Sig Start Date End Date Taking? Authorizing Provider  aspirin  EC 81 MG EC tablet Take 1 tablet (81 mg total) by mouth daily. Swallow whole.  07/24/20  Yes Gold, Wayne E, PA-C  finasteride  (PROSCAR ) 5 MG tablet Take 1 tablet (5 mg total) by mouth daily. 03/27/23  Yes McGowan, Clotilda A, PA-C  metoprolol  succinate (TOPROL -XL) 25 MG 24 hr tablet Take 0.5 tablets (12.5 mg total) by mouth daily. 12/23/22  Yes Furth, Cadence H, PA-C  pantoprazole  (PROTONIX ) 40 MG tablet Take 1 tablet (40 mg total) by mouth daily. 12/23/22  Yes Furth, Cadence H, PA-C  rosuvastatin  (CRESTOR ) 20 MG tablet Take 1 tablet (20 mg total) by mouth daily. 12/23/22  Yes Darron Deatrice LABOR, MD    Allergies as of 11/13/2023 - Review Complete 05/26/2023  Allergen Reaction Noted   Lovastatin  12/07/2014   Quinolones  08/11/2020   Androgel  [testosterone ] Rash 12/07/2014    Family History  Problem Relation Age of Onset   Alzheimer's disease Father    Prostate cancer Brother    Diabetes Mellitus II Mother    Stroke Mother     Social History   Socioeconomic History   Marital status: Single    Spouse name: Not on file   Number of children: Not on file   Years of education: Not on file   Highest education level: Not on file  Occupational History   Not on file  Tobacco Use   Smoking status: Some Days    Types: Cigars    Passive exposure: Past   Smokeless tobacco: Never  Vaping Use   Vaping status: Never Used  Substance and Sexual Activity   Alcohol  use: Yes    Comment: occassional   Drug use: No   Sexual activity: Not on file  Other Topics Concern   Not on file  Social History Narrative   Lives alone   Social Drivers of Health   Financial Resource Strain: Low Risk  (04/16/2023)   Received from [facility]   Overall Financial Resource Strain (CARDIA)    Difficulty of Paying Living Expenses: Not hard at all  Food Insecurity: No Food Insecurity (04/16/2023)   Received from [facility]   Hunger Vital Sign    Within the past 12 months, you worried that your food would run out before you got the money to buy more.:  Never true    Within the past 12 months, the food you bought just didn't last and you didn't have money to get more.: Never true  Transportation Needs: No Transportation Needs (04/16/2023)   Received from [facility] - Transportation    In the past 12 months, has lack of transportation kept you from medical appointments or from getting medications?: No    Lack of Transportation (Non-Medical): No  Physical Activity: Not on file  Stress: Not on file  Social Connections: Not on file  Intimate Partner Violence: Not on file    Review of Systems: See HPI, otherwise negative ROS  Physical Exam: BP 120/68   Pulse 70   Temp (!) 96.8 F (36 C) (  Temporal)   Resp 16   Ht 6' 5.01 (1.956 m)   Wt 129.7 kg   SpO2 96%   BMI 33.91 kg/m  General:   Alert,  pleasant and cooperative in NAD Head:  Normocephalic and atraumatic. Lungs:  Clear to auscultation.    Heart:  Regular rate and rhythm.   Impression/Plan: Brian Banks is here for ophthalmic surgery.  Risks, benefits, limitations, and alternatives regarding ophthalmic surgery have been reviewed with the patient.  Questions have been answered.  All parties agreeable.   MITTIE GASKIN, MD  11/26/2023, 9:51 AM

## 2023-11-26 NOTE — Op Note (Signed)
 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   PROCEDURE:  Phacoemulsification with Toric posterior chamber intraocular lens placement of the left eye with Malyugin ring  Ultrasound time: Procedure(s): PHACOEMULSIFICATION, CATARACT, WITH IOL INSERTION 6.81 00:48.7 (Left)  LENS:   Implant Name Type Inv. Item Serial No. Manufacturer Lot No. LRB No. Used Action  LENS IOL CLRN VT TRC 3 16.0 - [id number]  LENS IOL CLRN VT TRC 3 16.0 [id number] [employer]  Left 1 Implanted     [id number] Vivity Toric intraocular lens with 1.5 diopters of cylindrical power with axis orientation at 18 degrees.     SURGEON:  Dene FABIENE Etienne, MD   ANESTHESIA:  Topical with tetracaine drops and 2% Xylocaine  jelly, augmented with 1% preservative-free intracameral lidocaine .  COMPLICATIONS:  None.   DESCRIPTION OF PROCEDURE:  The patient was identified in the holding room and transported to the operating suite and placed in the supine position under the operating microscope.  The left eye was identified as the operative eye, and it was prepped and draped in the usual sterile ophthalmic fashion.    A clear-corneal paracentesis incision was made at the 1:30 position.  0.5 ml of preservative-free 1% lidocaine  was injected into the anterior chamber. The anterior chamber was filled with Viscoat.  A 2.4 millimeter near clear corneal incision was then made at the 10:30 position. A Malyugin ring was placed. A cystotome and capsulorrhexis forceps were then used to make a curvilinear capsulorrhexis.  Hydrodissection and hydrodelineation were then performed using balanced salt solution.   Phacoemulsification was then used in stop and chop fashion to remove the lens, nucleus and epinucleus.  The remaining cortex was aspirated using the irrigation and  aspiration handpiece.  Provisc viscoelastic was then placed into the capsular bag to distend it for lens placement.  The Verion digital marker was used to align the implant at the intended axis.   A Toric lens was then injected into the capsular bag.  It was rotated clockwise until the axis marks on the lens were approximately 15 degrees in the counterclockwise direction to the intended alignment.  The Malyugin ring was removed. The viscoelastic was aspirated from the eye using the irrigation aspiration handpiece.  Then, a Koch spatula through the sideport incision was used to rotate the lens in a clockwise direction until the axis markings of the intraocular lens were lined up with the Verion alignment.  Balanced salt solution was then used to hydrate the wounds. Cefuroxime 0.1 ml of a 10mg /ml solution was injected into the anterior chamber for a dose of 1 mg of intracameral antibiotic at the completion of the case.    The eye was noted to have a physiologic pressure and there was no wound leak noted.   Timolol and Brimonidine drops were applied to the eye.  The patient was taken to the recovery room in stable condition having had no complications of anesthesia or surgery.  Zyler Hyson 11/26/2023, 11:10 AM

## 2023-11-26 NOTE — Anesthesia Postprocedure Evaluation (Signed)
 Anesthesia Post Note  Patient: Brian Banks  Procedure(s) Performed: PHACOEMULSIFICATION, CATARACT, WITH IOL INSERTION 6.81 00:48.7 (Left: Eye)  Patient location during evaluation: PACU Anesthesia Type: MAC Level of consciousness: awake and alert Pain management: pain level controlled Vital Signs Assessment: post-procedure vital signs reviewed and stable Respiratory status: spontaneous breathing, nonlabored ventilation, respiratory function stable and patient connected to nasal cannula oxygen Cardiovascular status: stable and blood pressure returned to baseline Postop Assessment: no apparent nausea or vomiting Anesthetic complications: no   No notable events documented.   Last Vitals:  Vitals:   11/26/23 1110 11/26/23 1115  BP: 119/86 (!) 114/91  Pulse: 64 65  Resp: 12 11  Temp: (!) 36.1 C (!) 36.1 C  SpO2: 98% 96%    Last Pain:  Vitals:   11/26/23 1115  TempSrc:   PainSc: 0-No pain                 Liberti Appleton C Meryl Ponder

## 2023-12-02 DIAGNOSIS — H2511 Age-related nuclear cataract, right eye: Secondary | ICD-10-CM | POA: Diagnosis not present

## 2023-12-03 ENCOUNTER — Encounter: Payer: Self-pay | Admitting: Ophthalmology

## 2023-12-03 NOTE — Anesthesia Preprocedure Evaluation (Addendum)
 Anesthesia Evaluation  Patient identified by MRN, date of birth, ID band Patient awake    Reviewed: Allergy & Precautions, H&P , NPO status , Patient's Chart, lab work & pertinent test results  Airway Mallampati: III  TM Distance: >3 FB     Dental no notable dental hx.    Pulmonary neg pulmonary ROS, Current Smoker          Cardiovascular hypertension, negative cardio ROS   07-18-20 cath Ost LM to Dist LM lesion is 40% stenosed. Dist LM to Ost LAD lesion is 95% stenosed. Ost Cx to Prox Cx lesion is 20% stenosed. Prox LAD lesion is 80% stenosed. Prox LAD to Mid LAD lesion is 95% stenosed. LPDA lesion is 70% stenosed. 3rd Mrg lesion is 50% stenosed. The left ventricular systolic function is normal. LV end diastolic pressure is mildly elevated. The left ventricular ejection fraction is 55-65% by visual estimate.   1.  Left dominant coronary arteries with moderate left main disease, critical ostial LAD disease with 2 other lesions in the proximal segment and significant ostial left PDA disease.  The ostial/proximal LAD is moderately to severely calcified.  The takeoff from the left main does not allow stenting of the LAD without jailing the large dominant left circumflex. 2.  Normal LV systolic function and mildly elevated left ventricular end-diastolic pressure.   Recommendations: The patient's LAD disease is not approachable percutaneously.  I think the best option is CABG. Transfer the patient to [facility]. Resume heparin  at 9 PM. Obtain an echocardiogram.       11-15-20 echo  1. Left ventricular ejection fraction, by estimation, is 45 to 50%. The  left ventricle has mildly decreased function. The left ventricle  demonstrates global hypokinesis. The left ventricular internal cavity size  was moderately to severely dilated. Left  ventricular diastolic parameters were normal.   2. Right ventricular  systolic function is normal. The right ventricular  size is mildly enlarged.   3. Left atrial size was mildly dilated.   4. The mitral valve is normal in structure. No evidence of mitral valve  regurgitation.   5. The aortic valve is tricuspid. Aortic valve regurgitation is not  visualized.         Neuro/Psych negative neurological ROS  negative psych ROS   GI/Hepatic negative GI ROS, Neg liver ROS,,,  Endo/Other  negative endocrine ROSdiabetes    Renal/GU negative Renal ROS  negative genitourinary   Musculoskeletal negative musculoskeletal ROS (+)    Abdominal   Peds negative pediatric ROS (+)  Hematology negative hematology ROS (+)   Anesthesia Other Findings Previous cataract 11-26-23 Dr. 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 History of echocardiogram Hiatal hernia Dupuytren's contracture of left hand HTN (hypertension) DDD (degenerative disc disease), thoracolumbar Osteoarthritis Borderline diabetes OSA (obstructive sleep apnea) 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) Testosterone  deficiency Heart murmur     Reproductive/Obstetrics negative OB ROS                              Anesthesia Physical Anesthesia Plan  ASA: 3  Anesthesia Plan: MAC   Post-op Pain Management:  Induction: Intravenous  PONV Risk Score and Plan:   Airway Management Planned: Natural Airway and Nasal Cannula  Additional Equipment:   Intra-op Plan:   Post-operative Plan:   Informed Consent: I have reviewed the patients History and  Physical, chart, labs and discussed the procedure including the risks, benefits and alternatives for the proposed anesthesia with the patient or authorized representative who has indicated his/her understanding and acceptance.     Dental Advisory Given  Plan Discussed with: Anesthesiologist, CRNA and Surgeon  Anesthesia Plan Comments: (Patient consented for risks of anesthesia including but not limited to:  - adverse reactions to medications - damage to eyes, teeth, lips or other oral mucosa - nerve damage due to positioning  - sore throat or hoarseness - Damage to heart, brain, nerves, lungs, other parts of body or loss of life  Patient voiced understanding and assent.)         Anesthesia Quick Evaluation

## 2023-12-08 NOTE — Discharge Instructions (Signed)

## 2023-12-10 ENCOUNTER — Ambulatory Visit
Admission: RE | Admit: 2023-12-10 | Discharge: 2023-12-10 | Disposition: A | Discharge status: Home / Self Care | Attending: Ophthalmology | Admitting: Ophthalmology

## 2023-12-10 ENCOUNTER — Encounter
Admission: RE | Disposition: A | Discharge status: Home / Self Care | Payer: Self-pay | Source: Home / Self Care | Attending: Ophthalmology

## 2023-12-10 ENCOUNTER — Other Ambulatory Visit: Payer: Self-pay

## 2023-12-10 ENCOUNTER — Encounter: Payer: Self-pay | Admitting: Ophthalmology

## 2023-12-10 ENCOUNTER — Ambulatory Visit: Payer: Self-pay | Admitting: Anesthesiology

## 2023-12-10 DIAGNOSIS — F17290 Nicotine dependence, other tobacco product, uncomplicated: Secondary | ICD-10-CM | POA: Insufficient documentation

## 2023-12-10 DIAGNOSIS — E1136 Type 2 diabetes mellitus with diabetic cataract: Secondary | ICD-10-CM | POA: Diagnosis not present

## 2023-12-10 DIAGNOSIS — I10 Essential (primary) hypertension: Secondary | ICD-10-CM | POA: Insufficient documentation

## 2023-12-10 DIAGNOSIS — I25110 Atherosclerotic heart disease of native coronary artery with unstable angina pectoris: Secondary | ICD-10-CM | POA: Diagnosis not present

## 2023-12-10 DIAGNOSIS — F17200 Nicotine dependence, unspecified, uncomplicated: Secondary | ICD-10-CM | POA: Diagnosis not present

## 2023-12-10 DIAGNOSIS — H2511 Age-related nuclear cataract, right eye: Secondary | ICD-10-CM | POA: Insufficient documentation

## 2023-12-10 HISTORY — PX: CATARACT EXTRACTION W/PHACO: SHX586

## 2023-12-10 SURGERY — PHACOEMULSIFICATION, CATARACT, WITH IOL INSERTION
Anesthesia: Monitor Anesthesia Care | Site: Eye | Laterality: Right

## 2023-12-10 MED ORDER — SIGHTPATH DOSE#1 NA HYALUR & NA CHOND-NA HYALUR IO KIT
PACK | INTRAOCULAR | Status: DC | PRN
Start: 2023-12-10 — End: 2023-12-10
  Administered 2023-12-10: 1 via OPHTHALMIC

## 2023-12-10 MED ORDER — MIDAZOLAM HCL 2 MG/2ML IJ SOLN
INTRAMUSCULAR | Status: AC
  Filled 2023-12-10: qty 2

## 2023-12-10 MED ORDER — SIGHTPATH DOSE#1 BSS IO SOLN
INTRAOCULAR | Status: DC | PRN
Start: 2023-12-10 — End: 2023-12-10
  Administered 2023-12-10: 15 mL via INTRAOCULAR

## 2023-12-10 MED ORDER — CYCLOPENTOLATE HCL 2 % OP SOLN
1.0000 [drp] | OPHTHALMIC | Status: AC
  Administered 2023-12-10 (×3): 1 [drp] via OPHTHALMIC

## 2023-12-10 MED ORDER — LIDOCAINE HCL (PF) 2 % IJ SOLN
INTRAMUSCULAR | Status: DC | PRN
  Administered 2023-12-10: 2 mL

## 2023-12-10 MED ORDER — PHENYLEPHRINE HCL 10 % OP SOLN
1.0000 [drp] | OPHTHALMIC | Status: AC
  Administered 2023-12-10 (×3): 1 [drp] via OPHTHALMIC

## 2023-12-10 MED ORDER — TETRACAINE HCL 0.5 % OP SOLN
1.0000 [drp] | OPHTHALMIC | Status: DC | PRN
  Administered 2023-12-10 (×2): 1 [drp] via OPHTHALMIC

## 2023-12-10 MED ORDER — CEFUROXIME OPHTHALMIC INJECTION 1 MG/0.1 ML
INJECTION | OPHTHALMIC | Status: DC | PRN
  Administered 2023-12-10: 1 mg via INTRACAMERAL

## 2023-12-10 MED ORDER — LACTATED RINGERS IV SOLN: INTRAVENOUS | Status: DC

## 2023-12-10 MED ORDER — TETRACAINE HCL 0.5 % OP SOLN
OPHTHALMIC | Status: AC
  Filled 2023-12-10: qty 4

## 2023-12-10 MED ORDER — FENTANYL CITRATE (PF) 100 MCG/2ML IJ SOLN
INTRAMUSCULAR | Status: DC | PRN
  Administered 2023-12-10: 100 ug via INTRAVENOUS

## 2023-12-10 MED ORDER — CYCLOPENTOLATE HCL 2 % OP SOLN
OPHTHALMIC | Status: AC
  Filled 2023-12-10: qty 2

## 2023-12-10 MED ORDER — BRIMONIDINE TARTRATE-TIMOLOL 0.2-0.5 % OP SOLN
OPHTHALMIC | Status: DC | PRN
  Administered 2023-12-10: 1 [drp] via OPHTHALMIC

## 2023-12-10 MED ORDER — FENTANYL CITRATE (PF) 100 MCG/2ML IJ SOLN
INTRAMUSCULAR | Status: AC
  Filled 2023-12-10: qty 2

## 2023-12-10 MED ORDER — EPINEPHRINE PF 1 MG/ML IJ SOLN
INTRAMUSCULAR | Status: DC | PRN
  Administered 2023-12-10: 65 mL via OPHTHALMIC

## 2023-12-10 MED ORDER — MIDAZOLAM HCL (PF) 2 MG/2ML IJ SOLN
INTRAMUSCULAR | Status: DC | PRN
  Administered 2023-12-10: 2 mg via INTRAVENOUS

## 2023-12-10 MED ORDER — PHENYLEPHRINE HCL 10 % OP SOLN
OPHTHALMIC | Status: AC
  Filled 2023-12-10: qty 5

## 2023-12-10 SURGICAL SUPPLY — 8 items
FEE CATARACT SUITE SIGHTPATH (MISCELLANEOUS) ×1 IMPLANT
GLOVE BIOGEL PI IND STRL 8 (GLOVE) ×1 IMPLANT
GLOVE SURG LX STRL 7.5 STRW (GLOVE) ×1 IMPLANT
GLOVE SURG SYN 6.5 PF PI BL (GLOVE) ×1 IMPLANT
LENS IOL CLRN VT TRC 3 14.0 IMPLANT
NDL FILTER BLUNT 18X1 1/2 (NEEDLE) ×1 IMPLANT
NEEDLE FILTER BLUNT 18X1 1/2 (NEEDLE) ×1 IMPLANT
SYR 3ML LL SCALE MARK (SYRINGE) ×1 IMPLANT

## 2023-12-10 NOTE — H&P (Signed)
 [facility]   Primary Care Physician:  Fernande Ophelia JINNY DOUGLAS, MD Ophthalmologist: Dr. Dene Etienne  Pre-Procedure History & Physical: HPI:  Brian Banks is a 78 y.o. male here for ophthalmic surgery.   Past Medical History:  Diagnosis Date   Benign neoplasm of large bowel    Borderline diabetes    BPH with obstruction/lower urinary tract symptoms    Coronary artery disease 07/18/2020   a.) LHC 07/18/2020 (setting of NSTEMI): 40% o-dLM, 95% dLM-oLAD, 20% o-pLCx, 80% pLAD, 95% p-mLAD, 70% LPDA, 50% OM3; b.) s/p 3v CABG 07/19/2020   DDD (degenerative disc disease), thoracolumbar    Dilatation of thoracic aorta    Dupuytren's contracture of left hand    Dysphagia    Erectile dysfunction    GERD (gastroesophageal reflux disease)    Heart murmur    Hepatic steatosis    Hiatal hernia    History of echocardiogram 11/15/2020   a.) TTE 11/15/2020: EF 45-50%, glob HK, mod-sev LV dil, mild RVE, mild LAE   History of stomach ulcers    HLD (hyperlipidemia)    HTN (hypertension)    Hypogonadism in male    NSTEMI (non-ST elevated myocardial infarction) (HCC) 07/18/2020   a.) troponins trended: 20 --> 70 ng/L; b.) LHC 07/18/2020: 40% o-dLM, 95% dLM-oLAD, 20% o-pLCx, 80% pLAD, 95% p-mLAD, 70% LPDA, 50% OM3 --> transfer to [facility] for CVTS consult; c.) 3v CABG 07/19/2020   Obesity    OSA (obstructive sleep apnea)    a.) does not utilize nocturnal PAP therapy   Osteoarthritis    RBBB (right bundle branch block)    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  Also had PVCs requiring IV amiodarone : Discontinued at discharge.   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)    Vitamin D deficiency     Past Surgical History:  Procedure Laterality Date   ACHILLES TENDON REPAIR     APPENDECTOMY     CARDIAC CATHETERIZATION  07/2020   CATARACT EXTRACTION W/PHACO Left  11/26/2023   Procedure: PHACOEMULSIFICATION, CATARACT, WITH IOL INSERTION 6.81 00:48.7;  Surgeon: Etienne Dene, MD;  Location: [facility];  Service: Ophthalmology;  Laterality: Left;   COLONOSCOPY N/A 11/05/2021   Procedure: COLONOSCOPY;  Surgeon: Onita Elspeth Ozell, DO;  Location: [facility] ENDOSCOPY;  Service: Gastroenterology;  Laterality: N/A;   COLONOSCOPY     2005, 2011, 2014, 2017   COLONOSCOPY WITH PROPOFOL  N/A 05/26/2023   Procedure: COLONOSCOPY WITH PROPOFOL ;  Surgeon: Onita Elspeth Ozell, DO;  Location: [facility] ENDOSCOPY;  Service: Gastroenterology;  Laterality: N/A;   CORONARY ARTERY BYPASS GRAFT N/A 07/19/2020   Procedure: CORONARY ARTERY BYPASS GRAFTING (CABG) TIMES THREE ON PUMP USING LEFT INTERNAL MAMMARY ARTERY AND ENDOSCOPICALLY HARVESTED RIGHT GREATER SAPHENOUS VEIN;  Surgeon: German Bartlett PEDLAR, MD;  Location: [facility] OR;  Service: Open Heart Surgery;  Laterality: N/A;   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   Procedure: LEFT LITTLE FINGER DUPUYTREN'S EXCISION;  Surgeon: Edie Norleen JINNY, MD;  Location: [facility] ORS;  Service: Orthopedics;  Laterality: Left;   Eardrum implant Left    ENDOVEIN HARVEST OF GREATER SAPHENOUS VEIN Right 07/19/2020   Procedure: ENDOVEIN HARVEST OF GREATER SAPHENOUS VEIN;  Surgeon: German Bartlett PEDLAR, MD;  Location: [facility] OR;  Service: Open Heart Surgery;  Laterality: Right;   LEFT HEART CATH AND CORONARY ANGIOGRAPHY N/A 07/18/2020   Procedure: LEFT HEART CATH AND CORONARY  ANGIOGRAPHY;  Surgeon: Darron Deatrice LABOR, MD;  Location: [facility] INVASIVE CV LAB;  Service: Cardiovascular;  Laterality: N/A;   POLYPECTOMY  05/26/2023   Procedure: POLYPECTOMY, INTESTINE;  Surgeon: Onita Elspeth Sharper, DO;  Location: [facility] ENDOSCOPY;  Service: Gastroenterology;;   TEE WITHOUT CARDIOVERSION N/A 07/19/2020   Procedure: TRANSESOPHAGEAL ECHOCARDIOGRAM (TEE);  Surgeon: German Bartlett PEDLAR, MD;  Location: [facility] OR;  Service: Open Heart Surgery;  Laterality: N/A;   UPPER  GASTROINTESTINAL ENDOSCOPY     2011, 2014    Prior to Admission medications   Medication Sig Start Date End Date Taking? Authorizing Provider  aspirin  EC 81 MG EC tablet Take 1 tablet (81 mg total) by mouth daily. Swallow whole. 07/24/20  Yes Gold, Wayne E, PA-C  finasteride  (PROSCAR ) 5 MG tablet Take 1 tablet (5 mg total) by mouth daily. 03/27/23  Yes McGowan, Clotilda A, PA-C  metoprolol  succinate (TOPROL -XL) 25 MG 24 hr tablet Take 0.5 tablets (12.5 mg total) by mouth daily. 12/23/22  Yes Furth, Cadence H, PA-C  pantoprazole  (PROTONIX ) 40 MG tablet Take 1 tablet (40 mg total) by mouth daily. 12/23/22  Yes Furth, Cadence H, PA-C  rosuvastatin  (CRESTOR ) 20 MG tablet Take 1 tablet (20 mg total) by mouth daily. 12/23/22  Yes Darron Deatrice LABOR, MD    Allergies as of 11/13/2023 - Review Complete 05/26/2023  Allergen Reaction Noted   Lovastatin  12/07/2014   Quinolones  08/11/2020   Androgel  [testosterone ] Rash 12/07/2014    Family History  Problem Relation Age of Onset   Alzheimer's disease Father    Prostate cancer Brother    Diabetes Mellitus II Mother    Stroke Mother     Social History   Socioeconomic History   Marital status: Single    Spouse name: Not on file   Number of children: Not on file   Years of education: Not on file   Highest education level: Not on file  Occupational History   Not on file  Tobacco Use   Smoking status: Some Days    Types: Cigars    Passive exposure: Past   Smokeless tobacco: Never  Vaping Use   Vaping status: Never Used  Substance and Sexual Activity   Alcohol  use: Yes    Comment: occassional   Drug use: No   Sexual activity: Not on file  Other Topics Concern   Not on file  Social History Narrative   Lives alone   Social Drivers of Health   Financial Resource Strain: Low Risk  (04/16/2023)   Received from [facility]   Overall Financial Resource Strain (CARDIA)    Difficulty of Paying Living Expenses: Not hard at  all  Food Insecurity: No Food Insecurity (04/16/2023)   Received [facility]   Hunger Vital Sign    Within the past 12 months, you worried that your food would run out before you got the money to buy more.: Never true    Within the past 12 months, the food you bought just didn't last and you didn't have money to get more.: Never true  Transportation Needs: No Transportation Needs (04/16/2023)   Received from [facility] - Transportation    In the past 12 months, has lack of transportation kept you from medical appointments or from getting medications?: No    Lack of Transportation (Non-Medical): No  Physical Activity: Not on file  Stress: Not on file  Social Connections: Not on file  Intimate  Partner Violence: Not on file    Review of Systems: See HPI, otherwise negative ROS  Physical Exam: Ht 6' 5 (1.956 m)   Wt 129.7 kg   BMI 33.91 kg/m  General:   Alert,  pleasant and cooperative in NAD Head:  Normocephalic and atraumatic. Lungs:  Clear to auscultation.    Heart:  Regular rate and rhythm.   Impression/Plan: Brian Banks is here for ophthalmic surgery.  Risks, benefits, limitations, and alternatives regarding ophthalmic surgery have been reviewed with the patient.  Questions have been answered.  All parties agreeable.   MITTIE GASKIN, MD  12/10/2023, 8:56 AM

## 2023-12-10 NOTE — Anesthesia Postprocedure Evaluation (Signed)
 Anesthesia Post Note  Patient: Brian Banks  Procedure(s) Performed: PHACOEMULSIFICATION, CATARACT, WITH IOL INSERTION 8.99 00:42.3 (Right: Eye)  Patient location during evaluation: PACU Anesthesia Type: MAC Level of consciousness: awake and alert Pain management: pain level controlled Vital Signs Assessment: post-procedure vital signs reviewed and stable Respiratory status: spontaneous breathing, nonlabored ventilation, respiratory function stable and patient connected to nasal cannula oxygen Cardiovascular status: stable and blood pressure returned to baseline Postop Assessment: no apparent nausea or vomiting Anesthetic complications: no   No notable events documented.   Last Vitals:  Vitals:   12/10/23 1012 12/10/23 1016  BP: (!) 117/52 123/65  Pulse: 65 68  Resp: 19 18  Temp: (!) 36.2 C (!) 36.2 C  SpO2: 98% 95%    Last Pain:  Vitals:   12/10/23 1016  PainSc: 0-No pain                 Kristopher Attwood C Kejon Feild

## 2023-12-10 NOTE — Op Note (Signed)
 LOCATION:  [facility]   PREOPERATIVE DIAGNOSIS:  Nuclear sclerotic cataract of the right eye with miotic pupil.  H25.11   POSTOPERATIVE DIAGNOSIS:  Nuclear sclerotic cataract of the right eye with miotic pupil   PROCEDURE:  Phacoemulsification with Toric posterior chamber intraocular lens placement of the right eye with Malyugin ring  Ultrasound time: Procedure(s): PHACOEMULSIFICATION, CATARACT, WITH IOL INSERTION 8.99 00:42.3 (Right)  LENS:   Implant Name Type Inv. Item Serial No. Manufacturer Lot No. LRB No. Used Action  LENS IOL CLRN VT TRC 3 14.0 - [id number]  LENS IOL CLRN VT TRC 3 14.0 [id number] [employer]  Right 1 Implanted     [id number] Vivity Toric intraocular lens with 1.5 diopters of cylindrical power with axis orientation at 23 degrees.   SURGEON:  Dene FABIENE Etienne, MD   ANESTHESIA: Topical with tetracaine drops and 2% Xylocaine  jelly, augmented with 1% preservative-free intracameral lidocaine . .   COMPLICATIONS:  None.   DESCRIPTION OF PROCEDURE:  The patient was identified in the holding room and transported to the operating suite and placed in the supine position under the operating microscope.  The right eye was identified as the operative eye, and it was prepped and draped in the usual sterile ophthalmic fashion.    A clear-corneal paracentesis incision was made at the 12:00 position.  0.5 ml of preservative-free 1% lidocaine  was injected into the anterior chamber. The anterior chamber was filled with Viscoat.  A 2.4 millimeter near clear corneal incision was then made at the 9:00 position. A 7mm Malyugin ring was placed to enlarge the pupil. A cystotome and capsulorrhexis forceps were then used to make a curvilinear capsulorrhexis.  Hydrodissection and hydrodelineation were then performed using balanced salt solution.   Phacoemulsification was then used in stop and chop fashion to remove the lens, nucleus and epinucleus.  The remaining cortex was  aspirated using the irrigation and aspiration handpiece.  Provisc viscoelastic was then placed into the capsular bag to distend it for lens placement.  The Verion digital marker was used to align the implant at the intended axis.   A Toric lens was then injected into the capsular bag. The Malyugin ring was removed. It was rotated clockwise until the axis marks on the lens were approximately 15 degrees in the counterclockwise direction to the intended alignment.  The viscoelastic was aspirated from the eye using the irrigation aspiration handpiece.  Then, a Koch spatula through the sideport incision was used to rotate the lens in a clockwise direction until the axis markings of the intraocular lens were lined up with the Verion alignment.  Balanced salt solution was then used to hydrate the wounds. Cefuroxime 0.1 ml of a 10mg /ml solution was injected into the anterior chamber for a dose of 1 mg of intracameral antibiotic at the completion of the case.    The eye was noted to have a physiologic pressure and there was no wound leak noted.   Timolol and Brimonidine drops and Maxitrol ointment were applied to the eye.  The patient was taken to the recovery room in stable condition having had no complications of anesthesia or surgery.  Maddelynn Moosman 12/10/2023, 10:10 AM

## 2023-12-10 NOTE — Transfer of Care (Signed)
 Immediate Anesthesia Transfer of Care Note  Patient: Brian Banks  Procedure(s) Performed: PHACOEMULSIFICATION, CATARACT, WITH IOL INSERTION 8.99 00:42.3 (Right: Eye)  Patient Location: PACU  Anesthesia Type: MAC  Level of Consciousness: awake, alert  and patient cooperative  Airway and Oxygen Therapy: Patient Spontanous Breathing   Post-op Assessment: Post-op Vital signs reviewed, Patient's Cardiovascular Status Stable, Respiratory Function Stable, Patent Airway and No signs of Nausea or vomiting  Post-op Vital Signs: Reviewed and stable  Complications: No notable events documented.

## 2024-01-02 ENCOUNTER — Other Ambulatory Visit: Payer: Self-pay | Admitting: Cardiovascular Disease

## 2024-01-05 NOTE — Telephone Encounter (Signed)
 Patient is overdue appointment with the Cardiologist contact the office for further refills.  [phone]

## 2024-02-09 ENCOUNTER — Other Ambulatory Visit: Payer: Self-pay | Admitting: Medical

## 2024-02-25 ENCOUNTER — Other Ambulatory Visit: Payer: Self-pay | Admitting: Medical

## 2024-03-03 ENCOUNTER — Encounter: Payer: Self-pay | Admitting: Urology

## 2024-03-14 ENCOUNTER — Other Ambulatory Visit: Payer: Self-pay | Admitting: Cardiovascular Disease

## 2024-03-15 ENCOUNTER — Other Ambulatory Visit

## 2024-03-16 ENCOUNTER — Other Ambulatory Visit

## 2024-03-16 DIAGNOSIS — N401 Enlarged prostate with lower urinary tract symptoms: Secondary | ICD-10-CM

## 2024-03-17 LAB — PSA: Prostate Specific Ag, Serum: 0.9 ng/mL (ref 0.0–4.0)

## 2024-03-19 ENCOUNTER — Other Ambulatory Visit

## 2024-03-19 ENCOUNTER — Other Ambulatory Visit: Payer: PPO

## 2024-03-23 ENCOUNTER — Ambulatory Visit: Payer: PPO | Admitting: Urology

## 2024-03-24 ENCOUNTER — Ambulatory Visit: Admitting: Medical

## 2024-03-25 ENCOUNTER — Ambulatory Visit: Admitting: Urology

## 2024-03-26 ENCOUNTER — Ambulatory Visit: Admitting: Medical
